# Patient Record
Sex: Male | Born: 1965 | Race: White | Hispanic: No | Smoking: Never smoker
Health system: Southern US, Community
[De-identification: ages and names within clinical notes are randomized; demographics above are authoritative.]

## PROBLEM LIST (undated history)

## (undated) DIAGNOSIS — G4733 Obstructive sleep apnea (adult) (pediatric): Secondary | ICD-10-CM

## (undated) DIAGNOSIS — K5732 Diverticulitis of large intestine without perforation or abscess without bleeding: Secondary | ICD-10-CM

## (undated) DIAGNOSIS — F40240 Claustrophobia: Secondary | ICD-10-CM

## (undated) DIAGNOSIS — I6529 Occlusion and stenosis of unspecified carotid artery: Secondary | ICD-10-CM

## (undated) DIAGNOSIS — I313 Pericardial effusion (noninflammatory): Secondary | ICD-10-CM

## (undated) DIAGNOSIS — N200 Calculus of kidney: Secondary | ICD-10-CM

## (undated) DIAGNOSIS — I350 Nonrheumatic aortic (valve) stenosis: Principal | ICD-10-CM

## (undated) DIAGNOSIS — I712 Thoracic aortic aneurysm, without rupture: Secondary | ICD-10-CM

## (undated) DIAGNOSIS — I3139 Other pericardial effusion (noninflammatory): Secondary | ICD-10-CM

## (undated) DIAGNOSIS — M10262 Drug-induced gout, left knee: Secondary | ICD-10-CM

## (undated) DIAGNOSIS — K5790 Diverticulosis of intestine, part unspecified, without perforation or abscess without bleeding: Secondary | ICD-10-CM

## (undated) DIAGNOSIS — K219 Gastro-esophageal reflux disease without esophagitis: Secondary | ICD-10-CM

## (undated) DIAGNOSIS — Z9989 Dependence on other enabling machines and devices: Secondary | ICD-10-CM

## (undated) DIAGNOSIS — I428 Other cardiomyopathies: Secondary | ICD-10-CM

## (undated) HISTORY — PX: TONSILLECTOMY: SUR1361

## (undated) HISTORY — DX: Other cardiomyopathies: I42.8

## (undated) HISTORY — PX: HERNIA REPAIR: SHX51

## (undated) HISTORY — PX: CORONARY ARTERY BYPASS GRAFT: SHX141

## (undated) HISTORY — PX: COLON SURGERY: SHX602

## (undated) HISTORY — DX: Diverticulosis of intestine, part unspecified, without perforation or abscess without bleeding: K57.90

## (undated) HISTORY — PX: APPENDECTOMY: SHX54

## (undated) HISTORY — DX: Pericardial effusion (noninflammatory): I31.3

## (undated) HISTORY — DX: Other pericardial effusion (noninflammatory): I31.39

## (undated) HISTORY — DX: Occlusion and stenosis of unspecified carotid artery: I65.29

## (undated) HISTORY — PX: LITHOTRIPSY: SUR834

## (undated) HISTORY — DX: Obstructive sleep apnea (adult) (pediatric): G47.33

## (undated) HISTORY — DX: Dependence on other enabling machines and devices: Z99.89

---

## 2000-11-14 ENCOUNTER — Emergency Department (HOSPITAL_COMMUNITY): Admission: EM | Admit: 2000-11-14 | Discharge: 2000-11-14 | Payer: Self-pay | Admitting: Emergency Medicine

## 2002-12-17 HISTORY — PX: UMBILICAL HERNIA REPAIR: SHX196

## 2003-12-18 ENCOUNTER — Ambulatory Visit (HOSPITAL_COMMUNITY): Admission: RE | Admit: 2003-12-18 | Discharge: 2003-12-18 | Payer: Self-pay | Admitting: General Surgery

## 2003-12-18 ENCOUNTER — Encounter (INDEPENDENT_AMBULATORY_CARE_PROVIDER_SITE_OTHER): Payer: Self-pay | Admitting: Specialist

## 2008-01-31 ENCOUNTER — Encounter: Admission: RE | Admit: 2008-01-31 | Discharge: 2008-01-31 | Payer: Self-pay | Admitting: Family Medicine

## 2008-02-07 ENCOUNTER — Ambulatory Visit (HOSPITAL_COMMUNITY): Admission: RE | Admit: 2008-02-07 | Discharge: 2008-02-07 | Payer: Self-pay | Admitting: Urology

## 2011-05-02 NOTE — Op Note (Signed)
NAME:  Alec Kelly, Alec Kelly                         ACCOUNT NO.:  [id number]   MEDICAL RECORD NO.:  [id number]                   PATIENT TYPE:  AMB   LOCATION:  DAY                                  FACILITY:  [facility]   PHYSICIAN:  Timothy E. Earlene Plater, M.D.              DATE OF BIRTH:  24-Nov-1966   DATE OF PROCEDURE:  12/17/2002  DATE OF DISCHARGE:                                 OPERATIVE REPORT   PREOPERATIVE DIAGNOSIS:  Umbilical hernia.   POSTOPERATIVE DIAGNOSIS:  Umbilical hernia.   OPERATION/PROCEDURE:  Repair of umbilical hernia with mesh.   SURGEON:  Timothy E. Earlene Plater, M.D.   ANESTHESIA:  General.   INDICATIONS:  [name] is 89, considers himself well although he is over  300 pounds and quite obese.  He has developed an enlarging, painful  umbilical hernia and wishes to have it repaired after careful discussion in  the office.  He was seen, identified, the permit signed, evaluated by  anesthesia.   DESCRIPTION OF PROCEDURE:  The patient was taken to the operating room,  placed supine. General endotracheal anesthesia administered.  The abdomen  had been shaved.  It was prepped and draped in the usual fashion.  A large  umbilical hernia was obvious on the left side of the umbilical area.  The  skin of the umbilicus was anesthetized with 0.25% Marcaine with epinephrine.  An infraumbilical incision was made and the hernia mass obvious.  The  subcutaneous tissue was dissected away from the hernia mass and the hernia  mass was dissected down to its fascial neck.  The neck had to be opened  laterally in order to open the hernia sac and evaluate the contents.  This  was done.  Contents were all omentum.  Through the enlarged defect, now the  omentum could be reduced without difficulty.  The peritoneal sac was closed  at its neck with a 2-0 Vicryl.  Excess sac cut away and delivered off the  field.  Then the edges of the hernia could be completely evaluated.  Then  dissection along the  anterior of the fascia was accomplished for about 3 cm  outside the hernia defect.  A small defect was found superior on the left  side of the abdominal wall, about 0.5 cm.  The preperitoneal fat was reduced  and that defect was closed with a buried suture of 0 Prolene.  The remainder  of the fascia was intact.  A piece of mesh was cut and fashioned to fit this  fascia.  It was sewn down to the anterior fascia with a running 2-0 Prolene.  This completed the repair.  The wound was dry.  Counts were correct.  Subcutaneous tissue was approximated with 2-0 Vicryl and skin closed with 3-  0 Monocryl.  Steri-Strips applied.  Final counts were correct.  He tolerated  it well.  He was awakened and taken to the recovery  room in good condition.                                               Timothy E. Earlene Plater, M.D.    TED/MEDQ  D:  12/18/2003  T:  12/18/2003  Job:  [id number]

## 2011-09-05 LAB — CBC
MCHC: 35.4
Platelets: 402 — ABNORMAL HIGH
RBC: 4.75
WBC: 7.5

## 2011-09-05 LAB — BASIC METABOLIC PANEL
CO2: 26
Calcium: 9.1
GFR calc Af Amer: 48 — ABNORMAL LOW
Sodium: 140

## 2011-09-05 LAB — URINALYSIS, ROUTINE W REFLEX MICROSCOPIC
Glucose, UA: NEGATIVE
Hgb urine dipstick: NEGATIVE
Nitrite: NEGATIVE
Urobilinogen, UA: 0.2
pH: 6.5

## 2012-07-03 ENCOUNTER — Encounter (HOSPITAL_BASED_OUTPATIENT_CLINIC_OR_DEPARTMENT_OTHER): Payer: Self-pay | Admitting: *Deleted

## 2012-07-03 ENCOUNTER — Emergency Department (HOSPITAL_BASED_OUTPATIENT_CLINIC_OR_DEPARTMENT_OTHER)
Admission: EM | Admit: 2012-07-03 | Discharge: 2012-07-03 | Disposition: A | Discharge status: Home / Self Care | Payer: Self-pay | Attending: Emergency Medicine | Admitting: Emergency Medicine

## 2012-07-03 ENCOUNTER — Emergency Department (HOSPITAL_BASED_OUTPATIENT_CLINIC_OR_DEPARTMENT_OTHER): Payer: Self-pay

## 2012-07-03 DIAGNOSIS — X500XXA Overexertion from strenuous movement or load, initial encounter: Secondary | ICD-10-CM | POA: Insufficient documentation

## 2012-07-03 DIAGNOSIS — S8390XA Sprain of unspecified site of unspecified knee, initial encounter: Secondary | ICD-10-CM

## 2012-07-03 DIAGNOSIS — S838X9A Sprain of other specified parts of unspecified knee, initial encounter: Secondary | ICD-10-CM | POA: Insufficient documentation

## 2012-07-03 MED ORDER — OXYCODONE-ACETAMINOPHEN 5-325 MG PO TABS
1.0000 | ORAL_TABLET | Freq: Once | ORAL | Status: AC
  Administered 2012-07-03: 1 via ORAL
  Filled 2012-07-03: qty 1

## 2012-07-03 MED ORDER — OXYCODONE-ACETAMINOPHEN 5-325 MG PO TABS: 1.0000 | ORAL_TABLET | Freq: Four times a day (QID) | ORAL | Status: AC | PRN

## 2012-07-03 MED ORDER — KETOROLAC TROMETHAMINE 60 MG/2ML IM SOLN
60.0000 mg | Freq: Once | INTRAMUSCULAR | Status: AC
  Administered 2012-07-03: 60 mg via INTRAMUSCULAR
  Filled 2012-07-03: qty 2

## 2012-07-03 NOTE — ED Provider Notes (Signed)
History     CSN: [id number]  Arrival date & time 07/03/12  1029   First MD Initiated Contact with Patient 07/03/12 1052      Chief Complaint  Patient presents with  . Knee Pain    (Consider location/radiation/quality/duration/timing/severity/associated sxs/prior treatment) Patient is a 46 y.o. male presenting with knee pain. The history is provided by the patient.  Knee Pain This is a new (stepped off his truck at work and stepped wrong and twisted his knee) problem. The current episode started yesterday. The problem occurs constantly. The problem has been gradually worsening. Associated symptoms comments: none. The symptoms are aggravated by walking, bending, twisting and standing. Nothing relieves the symptoms. Treatments tried: NSAIDs. The treatment provided mild relief.    History reviewed. No pertinent past medical history.  Past Surgical History  Procedure Date  . Tonsillectomy   . Appendectomy   . Hernia repair     No family history on file.  History  Substance Use Topics  . Smoking status: Never Smoker   . Smokeless tobacco: Not on file  . Alcohol Use: Yes      Review of Systems  All other systems reviewed and are negative.    Allergies  Review of patient's allergies indicates no known allergies.  Home Medications  No current outpatient prescriptions on file.  BP 114/80  Pulse 72  Temp 99.7 F (37.6 C) (Oral)  Resp 19  SpO2 98%  Physical Exam  Nursing note and vitals reviewed. Constitutional: He is oriented to person, place, and time. He appears well-developed and well-nourished. No distress.  HENT:  Head: Normocephalic and atraumatic.  Eyes: EOM are normal. Pupils are equal, round, and reactive to light.  Musculoskeletal:       Right knee: He exhibits decreased range of motion, swelling, effusion and bony tenderness. He exhibits no erythema, normal alignment, no LCL laxity and no MCL laxity. tenderness found. Lateral joint line and patellar  tendon tenderness noted.       Pt can almost completely extend and flex the leg at the knee without any concern for tendon damage  Neurological: He is alert and oriented to person, place, and time. He has normal strength. No sensory deficit.  Skin: Skin is warm and dry. No rash noted. No erythema.    ED Course  Procedures (including critical care time)  Labs Reviewed - No data to display Dg Knee Complete 4 Views Right  07/03/2012  *RADIOLOGY REPORT*  Clinical Data: Knee pain.  RIGHT KNEE - COMPLETE 4+ VIEW  Comparison: No priors.  Findings: Four views of the right knee demonstrate no acute fracture, subluxation or dislocation.  There is a large suprapatellar joint effusion.  IMPRESSION: 1.  Large suprapatellar joint effusion, without evidence of underlying bony.  Original Report Authenticated By: Florencia Reasons, M.D.     1. Knee sprain       MDM   Patient with a knee injury that occurred yesterday. His very swollen suprapatellar area on his right knee with significant tenderness over the lateral portion of the patella. There is no sign of dislocation and there are no ligamental laxities. Patient is having significant pain and was having difficulty ambulating. Patient given pain control and plain films pending to   11:54 AM Films show a large suprapatellar joint effusion without evidence of bony injury. Concern for possible ligamentous injury. Patient placed in a knee immobilizer and given followup in one to 2 weeks for recheck and possible MRI  Gwyneth Sprout, MD 07/03/12 1154

## 2012-07-03 NOTE — ED Notes (Signed)
Patient states he twisted R knee yesterday while working and hurts to touch/ambulate, swollen, took ibuprofen yesterday but no relief

## 2012-09-25 ENCOUNTER — Encounter (HOSPITAL_BASED_OUTPATIENT_CLINIC_OR_DEPARTMENT_OTHER): Payer: Self-pay | Admitting: Emergency Medicine

## 2012-09-25 ENCOUNTER — Emergency Department (HOSPITAL_BASED_OUTPATIENT_CLINIC_OR_DEPARTMENT_OTHER)
Admission: EM | Admit: 2012-09-25 | Discharge: 2012-09-25 | Disposition: A | Discharge status: Home / Self Care | Payer: Self-pay | Attending: Emergency Medicine | Admitting: Emergency Medicine

## 2012-09-25 ENCOUNTER — Emergency Department (HOSPITAL_BASED_OUTPATIENT_CLINIC_OR_DEPARTMENT_OTHER): Payer: Self-pay

## 2012-09-25 DIAGNOSIS — K5792 Diverticulitis of intestine, part unspecified, without perforation or abscess without bleeding: Secondary | ICD-10-CM

## 2012-09-25 DIAGNOSIS — R109 Unspecified abdominal pain: Secondary | ICD-10-CM | POA: Insufficient documentation

## 2012-09-25 DIAGNOSIS — K5732 Diverticulitis of large intestine without perforation or abscess without bleeding: Secondary | ICD-10-CM | POA: Insufficient documentation

## 2012-09-25 HISTORY — DX: Calculus of kidney: N20.0

## 2012-09-25 LAB — CBC WITH DIFFERENTIAL/PLATELET
Basophils Absolute: 0 10*3/uL (ref 0.0–0.1)
Basophils Relative: 0 % (ref 0–1)
HCT: 44.2 % (ref 39.0–52.0)
Lymphocytes Relative: 17 % (ref 12–46)
MCHC: 35.1 g/dL (ref 30.0–36.0)
Neutro Abs: 7 10*3/uL (ref 1.7–7.7)
Neutrophils Relative %: 71 % (ref 43–77)
Platelets: 292 10*3/uL (ref 150–400)
RDW: 12.9 % (ref 11.5–15.5)
WBC: 9.9 10*3/uL (ref 4.0–10.5)

## 2012-09-25 LAB — COMPREHENSIVE METABOLIC PANEL
ALT: 27 U/L (ref 0–53)
AST: 25 U/L (ref 0–37)
Albumin: 3.8 g/dL (ref 3.5–5.2)
CO2: 28 mEq/L (ref 19–32)
Chloride: 101 mEq/L (ref 96–112)
GFR calc non Af Amer: 64 mL/min — ABNORMAL LOW (ref >90)
Potassium: 4.1 mEq/L (ref 3.5–5.1)
Sodium: 139 mEq/L (ref 135–145)
Total Bilirubin: 0.5 mg/dL (ref 0.3–1.2)

## 2012-09-25 LAB — URINALYSIS, ROUTINE W REFLEX MICROSCOPIC
Hgb urine dipstick: NEGATIVE
Leukocytes, UA: NEGATIVE
Nitrite: NEGATIVE
Specific Gravity, Urine: 1.023 (ref 1.005–1.030)
Urobilinogen, UA: 1 mg/dL (ref 0.0–1.0)

## 2012-09-25 LAB — LIPASE, BLOOD: Lipase: 43 U/L (ref 11–59)

## 2012-09-25 MED ORDER — KETOROLAC TROMETHAMINE 30 MG/ML IJ SOLN
30.0000 mg | Freq: Once | INTRAMUSCULAR | Status: AC
  Administered 2012-09-25: 30 mg via INTRAVENOUS
  Filled 2012-09-25: qty 1

## 2012-09-25 MED ORDER — IOHEXOL 300 MG/ML  SOLN
100.0000 mL | Freq: Once | INTRAMUSCULAR | Status: AC | PRN
  Administered 2012-09-25: 100 mL via INTRAVENOUS

## 2012-09-25 MED ORDER — ONDANSETRON HCL 4 MG/2ML IJ SOLN
4.0000 mg | Freq: Once | INTRAMUSCULAR | Status: AC
  Administered 2012-09-25: 4 mg via INTRAVENOUS
  Filled 2012-09-25: qty 2

## 2012-09-25 MED ORDER — IOHEXOL 300 MG/ML  SOLN
25.0000 mL | INTRAMUSCULAR | Status: AC
  Administered 2012-09-25 (×2): 25 mL via ORAL

## 2012-09-25 MED ORDER — CIPROFLOXACIN HCL 500 MG PO TABS: 500.0000 mg | ORAL_TABLET | Freq: Two times a day (BID) | ORAL | Status: DC

## 2012-09-25 MED ORDER — METRONIDAZOLE 500 MG PO TABS: 500.0000 mg | ORAL_TABLET | Freq: Two times a day (BID) | ORAL | Status: DC

## 2012-09-25 MED ORDER — AMPICILLIN-SULBACTAM SODIUM 3 (2-1) G IJ SOLR
3.0000 g | Freq: Once | INTRAMUSCULAR | Status: AC
  Administered 2012-09-25: 3 g via INTRAVENOUS
  Filled 2012-09-25: qty 3

## 2012-09-25 MED ORDER — OXYCODONE-ACETAMINOPHEN 7.5-325 MG PO TABS: 1.0000 | ORAL_TABLET | ORAL | Status: DC | PRN

## 2012-09-25 MED ORDER — SODIUM CHLORIDE 0.9 % IV SOLN: INTRAVENOUS | Status: DC

## 2012-09-25 NOTE — ED Notes (Signed)
Pt states he started to have groin pain bilaterally radiating to back x 3 days.  No dysuria or diff with elimination.  No N/V/D.  No fever.  Pt was riding in a truck when started.  Denies any injury.

## 2012-09-25 NOTE — ED Notes (Signed)
rx x 3 given for cipro flagyl and percocet

## 2012-09-25 NOTE — ED Provider Notes (Signed)
History     CSN: [id number]  Arrival date & time 09/25/12  1105   First MD Initiated Contact with Patient 09/25/12 1212      Chief Complaint  Patient presents with  . Pelvic Pain  . Back Pain     HPI Pt states he started to have groin pain bilaterally radiating to back x 3 days. No dysuria or diff with elimination. No N/V/D. No fever. Pt was riding in a truck when started. Denies any injury.  Nothing makes pain worse or better.  Patient denies diarrhea.  Denies any history prostate problems.  Past Medical History  Diagnosis Date  . Kidney stones     Past Surgical History  Procedure Date  . Tonsillectomy   . Appendectomy   . Hernia repair   . Lithotripsy     No family history on file.  History  Substance Use Topics  . Smoking status: Never Smoker   . Smokeless tobacco: Not on file  . Alcohol Use: Yes      Review of Systems All other review systems are negative Allergies  Review of patient's allergies indicates no known allergies.  Home Medications   Current Outpatient Rx  Name Route Sig Dispense Refill  . CIPROFLOXACIN HCL 500 MG PO TABS Oral Take 1 tablet (500 mg total) by mouth every 12 (twelve) hours. 20 tablet 0  . METRONIDAZOLE 500 MG PO TABS Oral Take 1 tablet (500 mg total) by mouth 2 (two) times daily. 20 tablet 0  . OXYCODONE-ACETAMINOPHEN 7.5-325 MG PO TABS Oral Take 1 tablet by mouth every 4 (four) hours as needed for pain. 30 tablet 0    BP 113/80  Pulse 79  Temp 98.9 F (37.2 C) (Oral)  Resp 20  Ht 5\' 8"  (1.727 m)  Wt 260 lb (117.935 kg)  BMI 39.53 kg/m2  SpO2 99%  Physical Exam  Nursing note and vitals reviewed. Constitutional: He is oriented to person, place, and time. He appears well-developed. No distress.  HENT:  Head: Normocephalic and atraumatic.  Eyes: Pupils are equal, round, and reactive to light.  Neck: Normal range of motion.  Cardiovascular: Normal rate and intact distal pulses.   Pulmonary/Chest: No respiratory  distress.  Abdominal: Normal appearance. He exhibits no distension. There is tenderness (Diffuse lower abdominal). There is no rebound and no guarding.  Musculoskeletal: Normal range of motion.  Neurological: He is alert and oriented to person, place, and time. No cranial nerve deficit.  Skin: Skin is warm and dry. No rash noted.  Psychiatric: He has a normal mood and affect. His behavior is normal.    ED Course  Procedures (including critical care time)  Medications  0.9 %  sodium chloride infusion (not administered)  Ampicillin-Sulbactam (UNASYN) 3 g in sodium chloride 0.9 % 100 mL IVPB (not administered)  ciprofloxacin (CIPRO) 500 MG tablet (not administered)  metroNIDAZOLE (FLAGYL) 500 MG tablet (not administered)  oxyCODONE-acetaminophen (PERCOCET) 7.5-325 MG per tablet (not administered)  ketorolac (TORADOL) 30 MG/ML injection 30 mg (30 mg Intravenous Given 09/25/12 1309)  ondansetron (ZOFRAN) injection 4 mg (4 mg Intravenous Given 09/25/12 1308)  iohexol (OMNIPAQUE) 300 MG/ML solution 25 mL (25 mL Oral Contrast Given 09/25/12 1247)  iohexol (OMNIPAQUE) 300 MG/ML solution 100 mL (100 mL Intravenous Contrast Given 09/25/12 1344)    Labs Reviewed  URINALYSIS, ROUTINE W REFLEX MICROSCOPIC - Abnormal; Notable for the following:    Bilirubin Urine SMALL (*)     All other components within normal limits  COMPREHENSIVE  METABOLIC PANEL - Abnormal; Notable for the following:    Glucose, Bld 100 (*)     GFR calc non Af Amer 64 (*)     GFR calc Af Amer 75 (*)     All other components within normal limits  LIPASE, BLOOD  CBC WITH DIFFERENTIAL   Ct Abdomen Pelvis W Contrast  09/25/2012  *RADIOLOGY REPORT*  Clinical Data: Abdominal pain radiating to back.  Leukocytosis. Previous appendectomy and hernia repair.  CT ABDOMEN AND PELVIS WITH CONTRAST  Technique:  Multidetector CT imaging of the abdomen and pelvis was performed following the standard protocol during bolus administration of  intravenous contrast.  Contrast: OMNIPAQUE IOHEXOL 300 MG/ML  SOLN  Comparison: None.  Findings: A tiny nonobstructing 2 mm calculus is seen in the upper pole of the left kidney.  There is no evidence of renal mass or hydronephrosis.  The other abdominal parenchymal organs are normal in appearance.  Gallbladder is unremarkable.  Moderate diverticulitis is seen involving the proximal sigmoid colon.  There is moderate pericolonic inflammatory changes within the sigmoid mesentery, as well as shotty mesenteric lymphadenopathy which is likely reactive.  No evidence of abscess or free fluid. No evidence of bowel obstruction.  IMPRESSION:  1.  Moderate diverticulitis of the proximal sigmoid colon. 2.  No evidence of abscess or other complication. 3.  Nonobstructing left nephrolithiasis incidentally noted.   Original Report Authenticated By: Danae Orleans, M.D.      1. Diverticulitis       MDM          Nelia Shi, MD 09/25/12 2234

## 2013-03-27 ENCOUNTER — Encounter (HOSPITAL_BASED_OUTPATIENT_CLINIC_OR_DEPARTMENT_OTHER): Payer: Self-pay | Admitting: *Deleted

## 2013-03-27 ENCOUNTER — Emergency Department (HOSPITAL_BASED_OUTPATIENT_CLINIC_OR_DEPARTMENT_OTHER): Payer: Self-pay

## 2013-03-27 ENCOUNTER — Emergency Department (HOSPITAL_BASED_OUTPATIENT_CLINIC_OR_DEPARTMENT_OTHER)
Admission: EM | Admit: 2013-03-27 | Discharge: 2013-03-27 | Disposition: A | Discharge status: Home / Self Care | Payer: Self-pay | Attending: Emergency Medicine | Admitting: Emergency Medicine

## 2013-03-27 DIAGNOSIS — R509 Fever, unspecified: Secondary | ICD-10-CM | POA: Insufficient documentation

## 2013-03-27 DIAGNOSIS — K5792 Diverticulitis of intestine, part unspecified, without perforation or abscess without bleeding: Secondary | ICD-10-CM

## 2013-03-27 DIAGNOSIS — Z87442 Personal history of urinary calculi: Secondary | ICD-10-CM | POA: Insufficient documentation

## 2013-03-27 DIAGNOSIS — R197 Diarrhea, unspecified: Secondary | ICD-10-CM | POA: Insufficient documentation

## 2013-03-27 DIAGNOSIS — K5732 Diverticulitis of large intestine without perforation or abscess without bleeding: Secondary | ICD-10-CM | POA: Insufficient documentation

## 2013-03-27 LAB — CBC WITH DIFFERENTIAL/PLATELET
Eosinophils Absolute: 0.1 10*3/uL (ref 0.0–0.7)
Eosinophils Relative: 1 % (ref 0–5)
HCT: 42.3 % (ref 39.0–52.0)
Lymphocytes Relative: 7 % — ABNORMAL LOW (ref 12–46)
Lymphs Abs: 0.9 10*3/uL (ref 0.7–4.0)
MCH: 31.3 pg (ref 26.0–34.0)
MCV: 87.2 fL (ref 78.0–100.0)
Monocytes Absolute: 1.2 10*3/uL — ABNORMAL HIGH (ref 0.1–1.0)
Platelets: 202 10*3/uL (ref 150–400)
RBC: 4.85 MIL/uL (ref 4.22–5.81)
WBC: 13.1 10*3/uL — ABNORMAL HIGH (ref 4.0–10.5)

## 2013-03-27 LAB — COMPREHENSIVE METABOLIC PANEL
BUN: 17 mg/dL (ref 6–23)
CO2: 24 mEq/L (ref 19–32)
Calcium: 9.1 mg/dL (ref 8.4–10.5)
Creatinine, Ser: 1.2 mg/dL (ref 0.50–1.35)
GFR calc Af Amer: 82 mL/min — ABNORMAL LOW (ref >90)
GFR calc non Af Amer: 71 mL/min — ABNORMAL LOW (ref >90)
Glucose, Bld: 117 mg/dL — ABNORMAL HIGH (ref 70–99)
Sodium: 138 mEq/L (ref 135–145)
Total Protein: 7.5 g/dL (ref 6.0–8.3)

## 2013-03-27 LAB — URINALYSIS, ROUTINE W REFLEX MICROSCOPIC
Bilirubin Urine: NEGATIVE
Glucose, UA: NEGATIVE mg/dL
Hgb urine dipstick: NEGATIVE
Protein, ur: NEGATIVE mg/dL
Urobilinogen, UA: 1 mg/dL (ref 0.0–1.0)

## 2013-03-27 MED ORDER — ONDANSETRON HCL 4 MG/2ML IJ SOLN
4.0000 mg | Freq: Once | INTRAMUSCULAR | Status: AC
  Administered 2013-03-27: 4 mg via INTRAVENOUS
  Filled 2013-03-27: qty 2

## 2013-03-27 MED ORDER — IOHEXOL 300 MG/ML  SOLN
100.0000 mL | Freq: Once | INTRAMUSCULAR | Status: AC | PRN
  Administered 2013-03-27: 100 mL via INTRAVENOUS

## 2013-03-27 MED ORDER — HYDROMORPHONE HCL PF 1 MG/ML IJ SOLN
1.0000 mg | Freq: Once | INTRAMUSCULAR | Status: AC
  Administered 2013-03-27: 1 mg via INTRAVENOUS
  Filled 2013-03-27: qty 1

## 2013-03-27 MED ORDER — METRONIDAZOLE 500 MG PO TABS: 500.0000 mg | ORAL_TABLET | Freq: Two times a day (BID) | ORAL | Status: DC

## 2013-03-27 MED ORDER — CIPROFLOXACIN HCL 500 MG PO TABS: 500.0000 mg | ORAL_TABLET | Freq: Two times a day (BID) | ORAL | Status: DC

## 2013-03-27 MED ORDER — OXYCODONE-ACETAMINOPHEN 5-325 MG PO TABS: 2.0000 | ORAL_TABLET | ORAL | Status: DC | PRN

## 2013-03-27 MED ORDER — ONDANSETRON 4 MG PO TBDP: 4.0000 mg | ORAL_TABLET | Freq: Three times a day (TID) | ORAL | Status: DC | PRN

## 2013-03-27 MED ORDER — IOHEXOL 300 MG/ML  SOLN
50.0000 mL | Freq: Once | INTRAMUSCULAR | Status: AC | PRN
  Administered 2013-03-27: 50 mL via ORAL

## 2013-03-27 NOTE — ED Provider Notes (Signed)
History     CSN: [id number]  Arrival date & time 03/27/13  1627   First MD Initiated Contact with Patient 03/27/13 1902      Chief Complaint  Patient presents with  . Abdominal Pain    (Consider location/radiation/quality/duration/timing/severity/associated sxs/prior treatment) Patient is a 47 y.o. male presenting with abdominal pain. The history is provided by the patient. No language interpreter was used.  Abdominal Pain Pain location:  LLQ Pain quality: sharp and shooting   Pain radiates to:  LLQ Pain severity:  Moderate Onset quality:  Gradual Timing:  Constant Progression:  Worsening Chronicity:  New Relieved by:  Nothing Worsened by:  Nothing tried Ineffective treatments:  None tried Associated symptoms: diarrhea and fever     Past Medical History  Diagnosis Date  . Kidney stones     Past Surgical History  Procedure Laterality Date  . Tonsillectomy    . Appendectomy    . Hernia repair    . Lithotripsy      History reviewed. No pertinent family history.  History  Substance Use Topics  . Smoking status: Never Smoker   . Smokeless tobacco: Not on file  . Alcohol Use: Yes      Review of Systems  Constitutional: Positive for fever.  Gastrointestinal: Positive for abdominal pain and diarrhea.  All other systems reviewed and are negative.    Allergies  Review of patient's allergies indicates no known allergies.  Home Medications   Current Outpatient Rx  Name  Route  Sig  Dispense  Refill  . ciprofloxacin (CIPRO) 500 MG tablet   Oral   Take 1 tablet (500 mg total) by mouth every 12 (twelve) hours.   20 tablet   0   . metroNIDAZOLE (FLAGYL) 500 MG tablet   Oral   Take 1 tablet (500 mg total) by mouth 2 (two) times daily.   20 tablet   0   . oxyCODONE-acetaminophen (PERCOCET) 7.5-325 MG per tablet   Oral   Take 1 tablet by mouth every 4 (four) hours as needed for pain.   30 tablet   0     BP 116/80  Pulse 100  Temp(Src) 100.5 F  (38.1 C) (Oral)  Resp 22  Ht 5\' 8"  (1.727 m)  Wt 265 lb (120.203 kg)  BMI 40.3 kg/m2  SpO2 94%  Physical Exam  Nursing note and vitals reviewed. Constitutional: He appears well-developed and well-nourished.  HENT:  Head: Normocephalic.  Eyes: Pupils are equal, round, and reactive to light.  Neck: Normal range of motion.  Cardiovascular: Normal rate and regular rhythm.   Pulmonary/Chest: Effort normal and breath sounds normal.  Abdominal: Soft. There is tenderness. There is guarding.  Musculoskeletal: Normal range of motion.  Neurological: He is alert.  Skin: Skin is warm.  Psychiatric: He has a normal mood and affect.    ED Course  Procedures (including critical care time)  Labs Reviewed  URINALYSIS, ROUTINE W REFLEX MICROSCOPIC - Abnormal; Notable for the following:    pH 8.5 (*)    Ketones, ur 15 (*)    All other components within normal limits  CBC WITH DIFFERENTIAL - Abnormal; Notable for the following:    WBC 13.1 (*)    Neutrophils Relative 83 (*)    Neutro Abs 10.9 (*)    Lymphocytes Relative 7 (*)    Monocytes Absolute 1.2 (*)    All other components within normal limits  COMPREHENSIVE METABOLIC PANEL - Abnormal; Notable for the following:  Glucose, Bld 117 (*)    GFR calc non Af Amer 71 (*)    GFR calc Af Amer 82 (*)    All other components within normal limits   No results found.   1. Diverticulitis       MDM  Pt has tender left lower quadrant.   Pt has had diverticulitis in the past but pain was not this bad.  Pt reports pain began this am.   Pt given Iv fluids,  Pain medications and zofran,   Ct scan shows diverticulitis in the same area as previous diverticulitis.    Pt given rx for flagyl, cipro,percocet, zofran odt        Elson Areas, PA-C 03/27/13 2144  Lonia Skinner Dixon, [place] 03/27/13 2243

## 2013-03-27 NOTE — ED Notes (Signed)
Pt states he had some blood in his stool last Th or Fri. Began having low abd pain yesterday. Denies urinary s/s. States having chills

## 2013-03-27 NOTE — ED Notes (Signed)
Patient transported to CT 

## 2013-03-27 NOTE — ED Provider Notes (Signed)
Medical screening examination/treatment/procedure(s) were performed by non-physician practitioner and as supervising physician I was immediately available for consultation/collaboration.  Ethelda Chick, MD 03/27/13 [phone]

## 2013-03-30 ENCOUNTER — Inpatient Hospital Stay (HOSPITAL_COMMUNITY)
Admission: EM | Admit: 2013-03-30 | Discharge: 2013-04-08 | DRG: 392 | Disposition: A | Discharge status: Home / Self Care | Payer: MEDICAID | Attending: Surgery | Admitting: Surgery

## 2013-03-30 ENCOUNTER — Encounter (HOSPITAL_COMMUNITY): Payer: Self-pay | Admitting: Emergency Medicine

## 2013-03-30 ENCOUNTER — Emergency Department (HOSPITAL_COMMUNITY): Payer: Self-pay

## 2013-03-30 DIAGNOSIS — K2289 Other specified disease of esophagus: Secondary | ICD-10-CM | POA: Clinically undetermined

## 2013-03-30 DIAGNOSIS — M25469 Effusion, unspecified knee: Secondary | ICD-10-CM | POA: Diagnosis not present

## 2013-03-30 DIAGNOSIS — D72829 Elevated white blood cell count, unspecified: Secondary | ICD-10-CM | POA: Diagnosis present

## 2013-03-30 DIAGNOSIS — M109 Gout, unspecified: Secondary | ICD-10-CM | POA: Diagnosis not present

## 2013-03-30 DIAGNOSIS — K5732 Diverticulitis of large intestine without perforation or abscess without bleeding: Principal | ICD-10-CM | POA: Diagnosis present

## 2013-03-30 DIAGNOSIS — M10069 Idiopathic gout, unspecified knee: Secondary | ICD-10-CM | POA: Diagnosis not present

## 2013-03-30 DIAGNOSIS — K5720 Diverticulitis of large intestine with perforation and abscess without bleeding: Secondary | ICD-10-CM | POA: Diagnosis present

## 2013-03-30 DIAGNOSIS — E66813 Obesity, class 3: Secondary | ICD-10-CM | POA: Diagnosis present

## 2013-03-30 DIAGNOSIS — Z6841 Body Mass Index (BMI) 40.0 and over, adult: Secondary | ICD-10-CM

## 2013-03-30 DIAGNOSIS — K228 Other specified diseases of esophagus: Secondary | ICD-10-CM | POA: Clinically undetermined

## 2013-03-30 DIAGNOSIS — K630 Abscess of intestine: Secondary | ICD-10-CM | POA: Diagnosis present

## 2013-03-30 HISTORY — DX: Drug-induced gout, left knee: M10.262

## 2013-03-30 HISTORY — DX: Diverticulitis of large intestine without perforation or abscess without bleeding: K57.32

## 2013-03-30 HISTORY — DX: Morbid (severe) obesity due to excess calories: E66.01

## 2013-03-30 LAB — COMPREHENSIVE METABOLIC PANEL
Albumin: 3.2 g/dL — ABNORMAL LOW (ref 3.5–5.2)
Alkaline Phosphatase: 73 U/L (ref 39–117)
BUN: 17 mg/dL (ref 6–23)
Calcium: 9 mg/dL (ref 8.4–10.5)
Creatinine, Ser: 0.97 mg/dL (ref 0.50–1.35)
GFR calc Af Amer: >90 mL/min (ref >90)
Glucose, Bld: 112 mg/dL — ABNORMAL HIGH (ref 70–99)
Potassium: 6.1 mEq/L — ABNORMAL HIGH (ref 3.5–5.1)
Total Protein: 8.2 g/dL (ref 6.0–8.3)

## 2013-03-30 LAB — CBC WITH DIFFERENTIAL/PLATELET
Basophils Relative: 0 % (ref 0–1)
Eosinophils Absolute: 0.1 10*3/uL (ref 0.0–0.7)
Eosinophils Relative: 1 % (ref 0–5)
HCT: 42.8 % (ref 39.0–52.0)
Hemoglobin: 15.3 g/dL (ref 13.0–17.0)
Lymphs Abs: 1 10*3/uL (ref 0.7–4.0)
MCH: 31.4 pg (ref 26.0–34.0)
MCHC: 35.7 g/dL (ref 30.0–36.0)
MCV: 87.9 fL (ref 78.0–100.0)
Monocytes Absolute: 1.2 10*3/uL — ABNORMAL HIGH (ref 0.1–1.0)
Monocytes Relative: 8 % (ref 3–12)
RBC: 4.87 MIL/uL (ref 4.22–5.81)

## 2013-03-30 LAB — POTASSIUM: Potassium: 3.7 mEq/L (ref 3.5–5.1)

## 2013-03-30 LAB — LIPASE, BLOOD: Lipase: 22 U/L (ref 11–59)

## 2013-03-30 MED ORDER — SODIUM CHLORIDE 0.9 % IV SOLN
1.0000 g | Freq: Every day | INTRAVENOUS | Status: DC
  Administered 2013-03-30 – 2013-04-06 (×8): 1 g via INTRAVENOUS
  Filled 2013-03-30 (×9): qty 1

## 2013-03-30 MED ORDER — CIPROFLOXACIN IN D5W 400 MG/200ML IV SOLN
400.0000 mg | Freq: Once | INTRAVENOUS | Status: AC
  Administered 2013-03-30: 400 mg via INTRAVENOUS
  Filled 2013-03-30: qty 200

## 2013-03-30 MED ORDER — IOHEXOL 300 MG/ML  SOLN
100.0000 mL | Freq: Once | INTRAMUSCULAR | Status: AC | PRN
  Administered 2013-03-30: 100 mL via INTRAVENOUS

## 2013-03-30 MED ORDER — SODIUM CHLORIDE 0.9 % IV SOLN
INTRAVENOUS | Status: DC
  Administered 2013-03-30 – 2013-04-05 (×10): via INTRAVENOUS
  Administered 2013-04-07: 500 mL via INTRAVENOUS

## 2013-03-30 MED ORDER — IOHEXOL 300 MG/ML  SOLN
50.0000 mL | Freq: Once | INTRAMUSCULAR | Status: AC | PRN
  Administered 2013-03-30: 50 mL via ORAL

## 2013-03-30 MED ORDER — ONDANSETRON HCL 4 MG/2ML IJ SOLN
4.0000 mg | Freq: Once | INTRAMUSCULAR | Status: AC
  Administered 2013-03-30: 4 mg via INTRAVENOUS
  Filled 2013-03-30: qty 2

## 2013-03-30 MED ORDER — SODIUM CHLORIDE 0.9 % IV SOLN
Freq: Once | INTRAVENOUS | Status: AC
  Administered 2013-03-30: 20:00:00 via INTRAVENOUS

## 2013-03-30 MED ORDER — ENOXAPARIN SODIUM 40 MG/0.4ML ~~LOC~~ SOLN
40.0000 mg | Freq: Every day | SUBCUTANEOUS | Status: DC
  Administered 2013-03-30 – 2013-04-07 (×9): 40 mg via SUBCUTANEOUS
  Filled 2013-03-30 (×10): qty 0.4

## 2013-03-30 MED ORDER — MORPHINE SULFATE 4 MG/ML IJ SOLN
4.0000 mg | INTRAMUSCULAR | Status: DC | PRN
  Administered 2013-03-30 – 2013-04-03 (×18): 4 mg via INTRAVENOUS
  Filled 2013-03-30 (×19): qty 1

## 2013-03-30 MED ORDER — FENTANYL CITRATE 0.05 MG/ML IJ SOLN
100.0000 ug | Freq: Once | INTRAMUSCULAR | Status: AC
  Administered 2013-03-30: 100 ug via INTRAVENOUS
  Filled 2013-03-30: qty 2

## 2013-03-30 MED ORDER — SODIUM CHLORIDE 0.9 % IV BOLUS (SEPSIS)
1000.0000 mL | Freq: Once | INTRAVENOUS | Status: AC
  Administered 2013-03-30: 1000 mL via INTRAVENOUS

## 2013-03-30 MED ORDER — METRONIDAZOLE IN NACL 5-0.79 MG/ML-% IV SOLN
500.0000 mg | Freq: Once | INTRAVENOUS | Status: AC
  Administered 2013-03-30: 500 mg via INTRAVENOUS
  Filled 2013-03-30: qty 100

## 2013-03-30 MED ORDER — ONDANSETRON HCL 4 MG/2ML IJ SOLN: 4.0000 mg | Freq: Four times a day (QID) | INTRAMUSCULAR | Status: DC | PRN

## 2013-03-30 MED ORDER — OXYCODONE HCL 5 MG PO TABS
5.0000 mg | ORAL_TABLET | ORAL | Status: DC | PRN
  Administered 2013-04-02 – 2013-04-07 (×12): 5 mg via ORAL
  Filled 2013-03-30 (×11): qty 1
  Filled 2013-03-30: qty 2
  Filled 2013-03-30: qty 1

## 2013-03-30 NOTE — ED Notes (Signed)
Pt c/o of left sided abd pain associated with nausea and vomiting, denies dirrhea. Unable to keep anything down, 2-3 episodes of vomiting today. Hx. of diverticulitis.

## 2013-03-30 NOTE — ED Provider Notes (Signed)
History     CSN: [id number]  Arrival date & time 03/30/13  1903   First MD Initiated Contact with Patient 03/30/13 1909      Chief Complaint  Patient presents with  . Abdominal Pain  . Nausea  . Emesis    (Consider location/radiation/quality/duration/timing/severity/associated sxs/prior treatment) HPI 47 yo otherwise healthy male presenting with worsening abdominal pain, nausea and vomiting.  He was seen in the ED on 4/13 and diagnosed with diverticulitis by CT scan.  He was discharged on Cipro and Flagyl.  He reports that he has been compliant with all medications and instructions, but has continued nausea, vomiting and fevers at home.  He states that the antibiotics mostly stay down.  The abdominal pain is now more diffuse, and his wife states that she is unable to sleep in the bed with him as it causes him pain when she moves.  Also has pain in the RUQ.  Had a non-bloody bowel movement his morning.    Past Medical History  Diagnosis Date  . Kidney stones   . Diverticulitis     Past Surgical History  Procedure Laterality Date  . Tonsillectomy    . Appendectomy    . Hernia repair    . Lithotripsy      No family history on file.  History  Substance Use Topics  . Smoking status: Never Smoker   . Smokeless tobacco: Not on file  . Alcohol Use: Yes      Review of Systems  Constitutional: Positive for fever and chills.  HENT: Negative.   Eyes: Negative.   Respiratory: Negative.   Cardiovascular: Negative.   Gastrointestinal: Positive for nausea, vomiting and abdominal pain. Negative for diarrhea, constipation and blood in stool.  Genitourinary: Negative.   Skin: Negative.   Neurological: Negative.     Allergies  Review of patient's allergies indicates no known allergies.  Home Medications   Current Outpatient Rx  Name  Route  Sig  Dispense  Refill  . ciprofloxacin (CIPRO) 500 MG tablet   Oral   Take 1 tablet (500 mg total) by mouth every 12 (twelve)  hours.   20 tablet   0   . metroNIDAZOLE (FLAGYL) 500 MG tablet   Oral   Take 1 tablet (500 mg total) by mouth 2 (two) times daily.   20 tablet   0   . ondansetron (ZOFRAN ODT) 4 MG disintegrating tablet   Oral   Take 1 tablet (4 mg total) by mouth every 8 (eight) hours as needed for nausea.   10 tablet   0   . oxyCODONE-acetaminophen (PERCOCET/ROXICET) 5-325 MG per tablet   Oral   Take 2 tablets by mouth every 4 (four) hours as needed for pain.   20 tablet   0     There were no vitals taken for this visit.  Physical Exam  Constitutional: He is oriented to person, place, and time. He appears well-developed and well-nourished. He appears distressed (especially with movement of bed).  HENT:  Head: Normocephalic and atraumatic.  Mouth/Throat: Oropharynx is clear and moist.  Eyes: Conjunctivae are normal. Right eye exhibits no discharge. Left eye exhibits no discharge.  Neck: Normal range of motion. Neck supple.  Cardiovascular: Normal rate, regular rhythm, normal heart sounds and intact distal pulses.   No murmur heard. Pulmonary/Chest: Effort normal and breath sounds normal. No respiratory distress. He has no wheezes. He has no rales.  Abdominal: Soft. He exhibits no distension. Bowel sounds are increased. There  is generalized tenderness. There is rebound and guarding.  Neurological: He is alert and oriented to person, place, and time. He exhibits normal muscle tone.  Skin: Skin is warm and dry. No rash noted. He is not diaphoretic. No erythema.  Psychiatric: He has a normal mood and affect. Thought content normal.    ED Course  Procedures (including critical care time)  Labs Reviewed  CBC WITH DIFFERENTIAL - Abnormal; Notable for the following:    WBC 15.5 (*)    Neutrophils Relative 85 (*)    Neutro Abs 13.2 (*)    Lymphocytes Relative 6 (*)    Monocytes Absolute 1.2 (*)    All other components within normal limits  COMPREHENSIVE METABOLIC PANEL - Abnormal;  Notable for the following:    Sodium 134 (*)    Potassium 6.1 (*)    Glucose, Bld 112 (*)    Albumin 3.2 (*)    AST 53 (*)    All other components within normal limits  LIPASE, BLOOD  POTASSIUM  BASIC METABOLIC PANEL  CBC   Ct Abdomen Pelvis W Contrast  03/30/2013  *RADIOLOGY REPORT*  Clinical Data: Left lower quadrant abdominal pain, history of diverticulitis.  Prior appendectomy.  CT ABDOMEN AND PELVIS WITH CONTRAST  Technique:  Multidetector CT imaging of the abdomen and pelvis was performed following the standard protocol during bolus administration of intravenous contrast.  Contrast: 50mL OMNIPAQUE IOHEXOL 300 MG/ML  SOLN, OMNIPAQUE IOHEXOL 300 MG/ML  SOLN  Comparison: 03/27/2013  Findings: Dependent bibasilar atelectasis noted.  There is new intra-abdominal free air as well as gas tracking within the mesentery and within a pericolonic rim enhancing fluid collection measuring 4.0 x 2.9 cm image 71, adjacent to the previously seen thickened segment of sigmoid colon.  There are other rim enhancing fluid collections within the pelvis, contiguous with pericolonic stranding, measuring 4.2 x 3.4 cm image 76 and 4.3 x 2.7 cm image 82, for example.  Subjectively, there is no significant change in the degree of sigmoid colonic wall thickening, however associated pericolonic stranding and other inflammatory change has increased.  High density material within the gallbladder is compatible with vicarious excretion of contrast or less likely sludge.  2 mm nonobstructing left upper renal pole calculus is stable.  A few left upper quadrant loops of jejunum measure 2.8 cm, upper limits of normal, but there is no evidence for obstruction to distal passage of contrast.  IMPRESSION: Interval sigmoid colonic perforation secondary to diverticulitis with multiple pericolonic/pelvic rim enhancing fluid collections compatible with abscess formation.  Critical Value/emergent results were called by telephone at the  time of interpretation on 03/30/2013 at 9:45 p.m. to Dr. Radford Pax, who verbally acknowledged these results.   Original Report Authenticated By: Christiana Pellant, M.D.      No diagnosis found.    MDM  47 yo M with diverticulitis diagnosed by CT on 03/27/13 presenting with worsening abdominal pain and continued n/v and fevers despite compliance with medications.  + Peritoneal signs on exam.  Hemodynamically stable with pulse in the 80s on exam and BP of 130s/80s.  Will check cbc, cmp, lipase.  1L NS bolus, zofran and fentanyl.  Will consult surgery for recommendations on further imaging.   10:00PM: CT showing free air and abscess formation.  K is elevated on labs, repeat ordered.  Surgery to admit patient.        Phebe Colla, MD 03/30/13 2202

## 2013-03-30 NOTE — H&P (Signed)
Alec Kelly is an 47 y.o. male.   Chief Complaint: abd pain HPI: [facility] with abd pain.  CT showed focal diverticulitis.  He was placed on cipro and flagyl and discharged.  He returns tonight with worsening pain, nausea and vomiting.    Past Medical History  Diagnosis Date  . Kidney stones   . Diverticulitis     Past Surgical History  Procedure Laterality Date  . Tonsillectomy    . Appendectomy    . Hernia repair    . Lithotripsy      History reviewed. No pertinent family history. Social History:  reports that he has never smoked. He does not have any smokeless tobacco history on file. He reports that  drinks alcohol. He reports that he does not use illicit drugs.  Allergies: No Known Allergies   (Not in a hospital admission)  Results for orders placed during the hospital encounter of 03/30/13 (from the past 48 hour(s))  CBC WITH DIFFERENTIAL     Status: Abnormal   Collection Time    03/30/13  7:06 PM      Result Value Range   WBC 15.5 (*) 4.0 - 10.5 K/uL   RBC 4.87  4.22 - 5.81 MIL/uL   Hemoglobin 15.3  13.0 - 17.0 g/dL   HCT 09.8  11.9 - 14.7 %   MCV 87.9  78.0 - 100.0 fL   MCH 31.4  26.0 - 34.0 pg   MCHC 35.7  30.0 - 36.0 g/dL   RDW 82.9  56.2 - 13.0 %   Platelets 306  150 - 400 K/uL   Neutrophils Relative 85 (*) 43 - 77 %   Neutro Abs 13.2 (*) 1.7 - 7.7 K/uL   Lymphocytes Relative 6 (*) 12 - 46 %   Lymphs Abs 1.0  0.7 - 4.0 K/uL   Monocytes Relative 8  3 - 12 %   Monocytes Absolute 1.2 (*) 0.1 - 1.0 K/uL   Eosinophils Relative 1  0 - 5 %   Eosinophils Absolute 0.1  0.0 - 0.7 K/uL   Basophils Relative 0  0 - 1 %   Basophils Absolute 0.0  0.0 - 0.1 K/uL  COMPREHENSIVE METABOLIC PANEL     Status: Abnormal   Collection Time    03/30/13  7:06 PM      Result Value Range   Sodium 134 (*) 135 - 145 mEq/L   Potassium 6.1 (*) 3.5 - 5.1 mEq/L   Comment: HEMOLYZED SPECIMEN, RESULTS MAY BE AFFECTED   Chloride 97  96 - 112 mEq/L   CO2  26  19 - 32 mEq/L   Glucose, Bld 112 (*) 70 - 99 mg/dL   BUN 17  6 - 23 mg/dL   Creatinine, Ser 8.65  0.50 - 1.35 mg/dL   Calcium 9.0  8.4 - 78.4 mg/dL   Total Protein 8.2  6.0 - 8.3 g/dL   Albumin 3.2 (*) 3.5 - 5.2 g/dL   AST 53 (*) 0 - 37 U/L   ALT 16  0 - 53 U/L   Comment: HEMOLYZED SPECIMEN, RESULTS MAY BE AFFECTED   Alkaline Phosphatase 73  39 - 117 U/L   Comment: HEMOLYZED SPECIMEN, RESULTS MAY BE AFFECTED   Total Bilirubin 0.7  0.3 - 1.2 mg/dL   GFR calc non Af Amer >90  >90 mL/min   GFR calc Af Amer >90  >90 mL/min   Comment:  The eGFR has been calculated     using the CKD EPI equation.     This calculation has not been     validated in all clinical     situations.     eGFR's persistently     <90 mL/min signify     possible Chronic Kidney Disease.  LIPASE, BLOOD     Status: None   Collection Time    03/30/13  7:06 PM      Result Value Range   Lipase 22  11 - 59 U/L   No results found.  Review of Systems  Constitutional: Positive for fever, chills and malaise/fatigue.  Respiratory: Negative for cough and shortness of breath.   Cardiovascular: Negative for chest pain.  Gastrointestinal: Positive for nausea, vomiting, abdominal pain and diarrhea. Negative for heartburn and constipation.       Lower abd pain  Genitourinary: Negative for dysuria, urgency and frequency.  Skin: Negative for rash.  Neurological: Negative for dizziness and headaches.  Endo/Heme/Allergies: Does not bruise/bleed easily.    Blood pressure 135/86, pulse 105, temperature 98.8 F (37.1 C), temperature source Oral, resp. rate 20, SpO2 95.00%. Physical Exam  Constitutional: He is oriented to person, place, and time. He appears well-developed and well-nourished. He appears distressed.  HENT:  Head: Normocephalic and atraumatic.  Eyes: Conjunctivae are normal. Pupils are equal, round, and reactive to light.  Neck: Normal range of motion.  Cardiovascular: Normal rate and regular  rhythm.   Respiratory: Effort normal and breath sounds normal.  GI: Soft. He exhibits no distension. There is tenderness. There is guarding.  Musculoskeletal: Normal range of motion.  Neurological: He is alert and oriented to person, place, and time.  Skin: Skin is warm and dry.     Assessment/Plan Alec Kelly is a 47 y.o. M that presents to the ED with worsening abd pain after being diagnosed with diverticulitis 3 days ago.  Repeat CT shows perforation and abscess.  I discussed the options with him.  Given that he is only mildly tachycardic and not hypotensive, I believe we could do a trial of IV Invanz, but plan on surgery if this did not resolve quickly.  He would like to avoid an ostomy if at all possible.  I will make him NPO with sips of clears.  I will recheck his labs in the AM.  He does have a slightly elevated wbc. He also has an elevated K.  The specimen was hemolyzed and his Cr was normal, so I do not feel that the hyperkalemia is real.  We will recheck in the AM anyway.   Alec Kelly C. 03/30/2013, 8:28 PM

## 2013-03-30 NOTE — ED Provider Notes (Signed)
I saw and evaluated the patient, reviewed the resident's note and I agree with the findings and plan.   .Face to face Exam:  General:  Awake HEENT:  Atraumatic Resp:  Normal effort Abd:  Nondistended.  Peritoneal irritation to exam Neuro:No focal weakness    Nelia Shi, MD 03/30/13 2239

## 2013-03-31 DIAGNOSIS — K5732 Diverticulitis of large intestine without perforation or abscess without bleeding: Secondary | ICD-10-CM

## 2013-03-31 DIAGNOSIS — K630 Abscess of intestine: Secondary | ICD-10-CM

## 2013-03-31 LAB — BASIC METABOLIC PANEL
CO2: 28 mEq/L (ref 19–32)
Glucose, Bld: 113 mg/dL — ABNORMAL HIGH (ref 70–99)
Potassium: 3.8 mEq/L (ref 3.5–5.1)
Sodium: 135 mEq/L (ref 135–145)

## 2013-03-31 LAB — CBC
HCT: 39.5 % (ref 39.0–52.0)
Hemoglobin: 13.7 g/dL (ref 13.0–17.0)
MCH: 30.6 pg (ref 26.0–34.0)
MCV: 88.2 fL (ref 78.0–100.0)
RBC: 4.48 MIL/uL (ref 4.22–5.81)

## 2013-03-31 NOTE — Care Management Note (Signed)
    Page 1 of 1   03/31/2013     11:59:44 AM   CARE MANAGEMENT NOTE 03/31/2013  Patient:  Alec Kelly, Alec Kelly   Account Number:  [id number]  Date Initiated:  03/31/2013  Documentation initiated by:  Lorenda Ishihara  Subjective/Objective Assessment:   47 yo male admitted with diverticulitis, abscess. PTA lived at home with spouse.     Action/Plan:   Home when stable   Anticipated DC Date:  03/31/2013   Anticipated DC Plan:  HOME/SELF CARE      DC Planning Services  CM consult      Choice offered to / List presented to:             Status of service:  Completed, signed off Medicare Important Message given?   (If response is "NO", the following Medicare IM given date fields will be blank) Date Medicare IM given:   Date Additional Medicare IM given:    Discharge Disposition:  HOME/SELF CARE  Per UR Regulation:  Reviewed for med. necessity/level of care/duration of stay  If discussed at Long Length of Stay Meetings, dates discussed:    Comments:

## 2013-03-31 NOTE — Progress Notes (Signed)
Subjective: Pt feels better today, but pain still severe with any movement.  Pt wanting some ice chips.  Pt having BM's, flatus and normal urination.  Pt laying in bed not ambulating due to pain.    Objective: Vital signs in last 24 hours: Temp:  [98.4 F (36.9 C)-99.1 F (37.3 C)] 98.4 F (36.9 C) (04/17 0513) Pulse Rate:  [89-108] 98 (04/17 0513) Resp:  [18-20] 18 (04/17 0513) BP: (101-135)/(63-86) 101/63 mmHg (04/17 0513) SpO2:  [93 %-95 %] 95 % (04/17 0513) Weight:  [265 lb (120.203 kg)] 265 lb (120.203 kg) (04/16 2206) Last BM Date: 03/31/13  Intake/Output from previous day: 04/16 0701 - 04/17 0700 In: 1812.5 [I.V.:1812.5] Out: 0  Intake/Output this shift:    PE: Gen:  Alert, NAD, pleasant Abd: Soft, moderate tenderness throughout, ND, +BS, no HSM, large horizontal scar in RLQ   Lab Results:   Recent Labs  03/30/13 1906 03/31/13 0418  WBC 15.5* 13.1*  HGB 15.3 13.7  HCT 42.8 39.5  PLT 306 257   BMET  Recent Labs  03/30/13 1906 03/30/13 2125 03/31/13 0418  NA 134*  --  135  K 6.1* 3.7 3.8  CL 97  --  98  CO2 26  --  28  GLUCOSE 112*  --  113*  BUN 17  --  15  CREATININE 0.97  --  1.09  CALCIUM 9.0  --  8.3*   PT/INR No results found for this basename: LABPROT, INR,  in the last 72 hours CMP     Component Value Date/Time   NA 135 03/31/2013 0418   K 3.8 03/31/2013 0418   CL 98 03/31/2013 0418   CO2 28 03/31/2013 0418   GLUCOSE 113* 03/31/2013 0418   BUN 15 03/31/2013 0418   CREATININE 1.09 03/31/2013 0418   CALCIUM 8.3* 03/31/2013 0418   PROT 8.2 03/30/2013 1906   ALBUMIN 3.2* 03/30/2013 1906   AST 53* 03/30/2013 1906   ALT 16 03/30/2013 1906   ALKPHOS 73 03/30/2013 1906   BILITOT 0.7 03/30/2013 1906   GFRNONAA 80* 03/31/2013 0418   GFRAA >90 03/31/2013 0418   Lipase     Component Value Date/Time   LIPASE 22 03/30/2013 1906       Studies/Results: Ct Abdomen Pelvis W Contrast  03/30/2013  *RADIOLOGY REPORT*  Clinical Data: Left lower  quadrant abdominal pain, history of diverticulitis.  Prior appendectomy.  CT ABDOMEN AND PELVIS WITH CONTRAST  Technique:  Multidetector CT imaging of the abdomen and pelvis was performed following the standard protocol during bolus administration of intravenous contrast.  Contrast: 50mL OMNIPAQUE IOHEXOL 300 MG/ML  SOLN, OMNIPAQUE IOHEXOL 300 MG/ML  SOLN  Comparison: 03/27/2013  Findings: Dependent bibasilar atelectasis noted.  There is new intra-abdominal free air as well as gas tracking within the mesentery and within a pericolonic rim enhancing fluid collection measuring 4.0 x 2.9 cm image 71, adjacent to the previously seen thickened segment of sigmoid colon.  There are other rim enhancing fluid collections within the pelvis, contiguous with pericolonic stranding, measuring 4.2 x 3.4 cm image 76 and 4.3 x 2.7 cm image 82, for example.  Subjectively, there is no significant change in the degree of sigmoid colonic wall thickening, however associated pericolonic stranding and other inflammatory change has increased.  High density material within the gallbladder is compatible with vicarious excretion of contrast or less likely sludge.  2 mm nonobstructing left upper renal pole calculus is stable.  A few left upper quadrant loops of  jejunum measure 2.8 cm, upper limits of normal, but there is no evidence for obstruction to distal passage of contrast.  IMPRESSION: Interval sigmoid colonic perforation secondary to diverticulitis with multiple pericolonic/pelvic rim enhancing fluid collections compatible with abscess formation.  Critical Value/emergent results were called by telephone at the time of interpretation on 03/30/2013 at 9:45 p.m. to Dr. Radford Pax, who verbally acknowledged these results.   Original Report Authenticated By: Christiana Pellant, M.D.     Anti-infectives: Anti-infectives   Start     Dose/Rate Route Frequency Ordered Stop   03/30/13 2230  ertapenem [facility]) 1 g in sodium chloride 0.9 % 50 mL  IVPB     1 g 100 mL/hr over 30 Minutes Intravenous Daily at bedtime 03/30/13 2156     03/30/13 1945  metroNIDAZOLE (FLAGYL) IVPB 500 mg     500 mg 100 mL/hr over 60 Minutes Intravenous  Once 03/30/13 1940 03/30/13 2125   03/30/13 1945  ciprofloxacin (CIPRO) IVPB 400 mg     400 mg 200 mL/hr over 60 Minutes Intravenous  Once 03/30/13 1940 03/30/13 2024       Assessment/Plan Diverticulitis with perforation and abscess 1.  Will try conservative treatment - Invanz (failed cipro/flagyl), IVF, pain control, antiemetics, NPO 2.  Ambulation and IS 3.  SCD's and lovenox 4.  If conservative treatment does not work then will need to go to OR and likely need ostomy which the patient is trying to avoid, patient understands this plan  Leukocytosis - repeat labs in am    LOS: 1 day    DORT, Osa Campoli 03/31/2013, 8:29 AM Pager: [phone]

## 2013-04-01 ENCOUNTER — Inpatient Hospital Stay (HOSPITAL_COMMUNITY): Payer: MEDICAID

## 2013-04-01 ENCOUNTER — Encounter (HOSPITAL_COMMUNITY): Payer: Self-pay | Admitting: Radiology

## 2013-04-01 LAB — CBC
HCT: 41.7 % (ref 39.0–52.0)
MCH: 30.6 pg (ref 26.0–34.0)
MCHC: 34.1 g/dL (ref 30.0–36.0)
MCV: 89.9 fL (ref 78.0–100.0)
RDW: 12.9 % (ref 11.5–15.5)

## 2013-04-01 LAB — BASIC METABOLIC PANEL
BUN: 17 mg/dL (ref 6–23)
Chloride: 101 mEq/L (ref 96–112)
Creatinine, Ser: 1.08 mg/dL (ref 0.50–1.35)
GFR calc Af Amer: >90 mL/min (ref >90)
GFR calc non Af Amer: 81 mL/min — ABNORMAL LOW (ref >90)

## 2013-04-01 MED ORDER — METRONIDAZOLE IN NACL 5-0.79 MG/ML-% IV SOLN
500.0000 mg | Freq: Three times a day (TID) | INTRAVENOUS | Status: DC
  Administered 2013-04-01 – 2013-04-05 (×11): 500 mg via INTRAVENOUS
  Filled 2013-04-01 (×12): qty 100

## 2013-04-01 MED ORDER — CALCIUM CARB-CHOLECALCIFEROL 600-400 MG-UNIT PO CHEW: 1.0000 | CHEWABLE_TABLET | Freq: Two times a day (BID) | ORAL | Status: DC

## 2013-04-01 MED ORDER — IOHEXOL 300 MG/ML  SOLN
100.0000 mL | Freq: Once | INTRAMUSCULAR | Status: AC | PRN
  Administered 2013-04-01: 100 mL via INTRAVENOUS

## 2013-04-01 MED ORDER — IOHEXOL 300 MG/ML  SOLN
50.0000 mL | Freq: Once | INTRAMUSCULAR | Status: AC | PRN
  Administered 2013-04-01: 50 mL via ORAL

## 2013-04-01 NOTE — Progress Notes (Signed)
Subjective: Pt still with moderate pain, although he says it is less.  He can now lay on his abdomen.  Objective: Vital signs in last 24 hours: Temp:  [97.9 F (36.6 C)-99.6 F (37.6 C)] 98.1 F (36.7 C) (04/18 0511) Pulse Rate:  [89-100] 91 (04/18 0511) Resp:  [18] 18 (04/18 0511) BP: (112-125)/(63-85) 122/85 mmHg (04/18 0511) SpO2:  [93 %-97 %] 96 % (04/18 0511) Last BM Date: 03/31/13  Intake/Output from previous day: 04/17 0701 - 04/18 0700 In: 2500 [I.V.:2500] Out: 1200 [Urine:1200] Intake/Output this shift:    Abdomen soft and obese Moderate tenderness with guarding in the LLQ  Lab Results:   Recent Labs  03/31/13 0418 04/01/13 0421  WBC 13.1* 13.6*  HGB 13.7 14.2  HCT 39.5 41.7  PLT 257 313   BMET  Recent Labs  03/31/13 0418 04/01/13 0421  NA 135 137  K 3.8 4.0  CL 98 101  CO2 28 28  GLUCOSE 113* 100*  BUN 15 17  CREATININE 1.09 1.08  CALCIUM 8.3* 8.8   PT/INR No results found for this basename: LABPROT, INR,  in the last 72 hours ABG No results found for this basename: PHART, PCO2, PO2, HCO3,  in the last 72 hours  Studies/Results: Ct Abdomen Pelvis W Contrast  03/30/2013  *RADIOLOGY REPORT*  Clinical Data: Left lower quadrant abdominal pain, history of diverticulitis.  Prior appendectomy.  CT ABDOMEN AND PELVIS WITH CONTRAST  Technique:  Multidetector CT imaging of the abdomen and pelvis was performed following the standard protocol during bolus administration of intravenous contrast.  Contrast: 50mL OMNIPAQUE IOHEXOL 300 MG/ML  SOLN, OMNIPAQUE IOHEXOL 300 MG/ML  SOLN  Comparison: 03/27/2013  Findings: Dependent bibasilar atelectasis noted.  There is new intra-abdominal free air as well as gas tracking within the mesentery and within a pericolonic rim enhancing fluid collection measuring 4.0 x 2.9 cm image 71, adjacent to the previously seen thickened segment of sigmoid colon.  There are other rim enhancing fluid collections within the  pelvis, contiguous with pericolonic stranding, measuring 4.2 x 3.4 cm image 76 and 4.3 x 2.7 cm image 82, for example.  Subjectively, there is no significant change in the degree of sigmoid colonic wall thickening, however associated pericolonic stranding and other inflammatory change has increased.  High density material within the gallbladder is compatible with vicarious excretion of contrast or less likely sludge.  2 mm nonobstructing left upper renal pole calculus is stable.  A few left upper quadrant loops of jejunum measure 2.8 cm, upper limits of normal, but there is no evidence for obstruction to distal passage of contrast.  IMPRESSION: Interval sigmoid colonic perforation secondary to diverticulitis with multiple pericolonic/pelvic rim enhancing fluid collections compatible with abscess formation.  Critical Value/emergent results were called by telephone at the time of interpretation on 03/30/2013 at 9:45 p.m. to Dr. Radford Pax, who verbally acknowledged these results.   Original Report Authenticated By: Christiana Pellant, M.D.     Anti-infectives: Anti-infectives   Start     Dose/Rate Route Frequency Ordered Stop   03/30/13 2230  ertapenem [facility]) 1 g in sodium chloride 0.9 % 50 mL IVPB     1 g 100 mL/hr over 30 Minutes Intravenous Daily at bedtime 03/30/13 2156     03/30/13 1945  metroNIDAZOLE (FLAGYL) IVPB 500 mg     500 mg 100 mL/hr over 60 Minutes Intravenous  Once 03/30/13 1940 03/30/13 2125   03/30/13 1945  ciprofloxacin (CIPRO) IVPB 400 mg     400  mg 200 mL/hr over 60 Minutes Intravenous  Once 03/30/13 1940 03/30/13 2024      Assessment/Plan: s/p * No surgery found * Perforated sigmoid diverticulitis with abscess  Will repeat CT today to see if collections have changed and whether any can be drained by IR.  If not, I suspect he is leaning toward exp lap and colostomy Continue IV antibiotics  LOS: 2 days    Corben Auzenne A 04/01/2013

## 2013-04-01 NOTE — Progress Notes (Signed)
Patient ID: Alec Kelly, male   DOB: Feb 08, 1966, 47 y.o.   MRN: [id number] Patient reports pain slightly improved Passing flatus  Abdomen still with focal left sided moderate tenderness and guarding  CT shows most anterior abscess has contrast in it.  The pelvic collections are minimally changed.  The free air is less  I am hesitant to place drains now for fear of creating a fistula.  Will add flagyl.  Will need eventual exp lap and probable colostomy, but with clinical improvement, he may warrant a bowel prep.  Will discuss with partners.

## 2013-04-02 ENCOUNTER — Inpatient Hospital Stay (HOSPITAL_COMMUNITY): Payer: MEDICAID

## 2013-04-02 ENCOUNTER — Encounter (HOSPITAL_COMMUNITY): Payer: Self-pay | Admitting: Radiology

## 2013-04-02 LAB — CBC
HCT: 38.3 % — ABNORMAL LOW (ref 39.0–52.0)
MCHC: 34.2 g/dL (ref 30.0–36.0)
RDW: 12.8 % (ref 11.5–15.5)

## 2013-04-02 LAB — PROTIME-INR: INR: 1.16 (ref 0.00–1.49)

## 2013-04-02 MED ORDER — FENTANYL CITRATE 0.05 MG/ML IJ SOLN
INTRAMUSCULAR | Status: AC | PRN
  Administered 2013-04-02: 50 ug via INTRAVENOUS
  Administered 2013-04-02: 100 ug via INTRAVENOUS

## 2013-04-02 MED ORDER — FENTANYL CITRATE 0.05 MG/ML IJ SOLN
INTRAMUSCULAR | Status: AC
  Filled 2013-04-02: qty 6

## 2013-04-02 MED ORDER — INDOMETHACIN ER 75 MG PO CPCR
75.0000 mg | ORAL_CAPSULE | Freq: Once | ORAL | Status: AC
  Administered 2013-04-02: 75 mg via ORAL
  Filled 2013-04-02: qty 1

## 2013-04-02 MED ORDER — MIDAZOLAM HCL 2 MG/2ML IJ SOLN
INTRAMUSCULAR | Status: AC | PRN
  Administered 2013-04-02: 0.5 mg via INTRAVENOUS
  Administered 2013-04-02: 1 mg via INTRAVENOUS

## 2013-04-02 MED ORDER — MIDAZOLAM HCL 2 MG/2ML IJ SOLN
INTRAMUSCULAR | Status: AC
  Filled 2013-04-02: qty 6

## 2013-04-02 NOTE — Progress Notes (Signed)
Patient ID: Alec Kelly, male   DOB: 1966/03/30, 47 y.o.   MRN: [id number]    Subjective: Feels about the same. No change in abdominal pain, no better or worse. Localized mainly in the left lower quadrant. Also complaining of pain and tenderness in his feet. Has history of gout. Has passed flatus but no bowel movements.  Objective: Vital signs in last 24 hours: Temp:  [99.3 F (37.4 C)-99.6 F (37.6 C)] 99.4 F (37.4 C) (04/19 0549) Pulse Rate:  [92-102] 102 (04/19 0549) Resp:  [18] 18 (04/19 0549) BP: (125-134)/(85-89) 125/85 mmHg (04/19 0549) SpO2:  [94 %-97 %] 97 % (04/19 0549) Last BM Date: 03/31/13  Intake/Output from previous day: 04/18 0701 - 04/19 0700 In: 3731.3 [I.V.:3481.3; IV Piggyback:250] Out: 1800 [Urine:1800] Intake/Output this shift:    General appearance: alert, cooperative, mild distress and morbidly obese Resp: clear to auscultation bilaterally and no increased work of breathing GI: tender across the lower abdomen, left greater than right with some guarding. Upper abdomen nontender.  Lab Results:   Recent Labs  04/01/13 0421 04/02/13 0439  WBC 13.6* 11.0*  HGB 14.2 13.1  HCT 41.7 38.3*  PLT 313 312   BMET  Recent Labs  03/31/13 0418 04/01/13 0421  NA 135 137  K 3.8 4.0  CL 98 101  CO2 28 28  GLUCOSE 113* 100*  BUN 15 17  CREATININE 1.09 1.08  CALCIUM 8.3* 8.8     Studies/Results: Ct Abdomen Pelvis W Contrast  04/01/2013  *RADIOLOGY REPORT*  Clinical Data: Perforated diverticulitis with abscess.  CT ABDOMEN AND PELVIS WITH CONTRAST  Technique:  Multidetector CT imaging of the abdomen and pelvis was performed following the standard protocol during bolus administration of intravenous contrast.  Contrast: OMNIPAQUE IOHEXOL 300 MG/ML  SOLN  Comparison: 03/30/2013  Findings: Subsegmental atelectasis is noted in the dependent lung bases.  No focal abnormality seen in the liver or spleen.  There is circumferential wall thickening in the  distal esophagus and the date diverticulum or lymph node adjacent to the distal esophagus seen on image 7.  This is stable since the exam from 09/25/2012  Stomach, duodenum, pancreas, and adrenal glands are unremarkable. High attenuation layering in the gallbladder lumen and is likely related to a vicarious excretion of contrast.  Kidneys are unremarkable.  No abdominal aortic aneurysm.  No free fluid or bulky lymphadenopathy in the abdomen.  The amount of intraperitoneal free air has decreased since the prior study.  Imaging through the pelvis again shows edema/inflammation around the proximal sigmoid colon.  Abscess cavity anterior to the colon fills with contrast on the current exam and measures slightly smaller than previously, at 3.4 x 2.4 cm today compared to 4.0 x 2.9 cm previously.  There is wall thickening in the proximal and mid sigmoid colon, as before.  Mild lymphadenopathy in the central left pelvis is stable.  Focal fluid collection with subtle rim enhancement in the left central pelvis measures 4.7 x 3.2 cm today compared to 4.2 x 3.4 cm previously.  A second fluid collection posterior to the bladder in the lower central pelvis measures 3.4 x 4.3 cm today compared  to 2.7 x 4.3 cm previously.  The terminal ileum is normal. The appendix is not visualized, but there is no edema or inflammation in the region of the cecum.  Left inguinal hernia contains only fat. Bone windows reveal no worrisome lytic or sclerotic osseous lesions.  IMPRESSION: Slight interval decrease in the volume of intraperitoneal  free air with contrast filling of an abscess cavity anterior to the proximal sigmoid colon.  Two discrete fluid collections in the central pelvis persist without substantial change and suggest evolving abscesses.  Circumferential wall thickening in the distal esophagus.  This is not substantially changed since 09/25/2012.  Esophagitis or neoplasm could have this appearance.   Original Report Authenticated By:  Kennith Center, M.D.     Anti-infectives: Anti-infectives   Start     Dose/Rate Route Frequency Ordered Stop   04/01/13 1800  metroNIDAZOLE (FLAGYL) IVPB 500 mg     500 mg 100 mL/hr over 60 Minutes Intravenous Every 8 hours 04/01/13 1737     03/30/13 2230  ertapenem (INVANZ) 1 g in sodium chloride 0.9 % 50 mL IVPB     1 g 100 mL/hr over 30 Minutes Intravenous Daily at bedtime 03/30/13 2156     03/30/13 1945  metroNIDAZOLE (FLAGYL) IVPB 500 mg     500 mg 100 mL/hr over 60 Minutes Intravenous  Once 03/30/13 1940 03/30/13 2125   03/30/13 1945  ciprofloxacin (CIPRO) IVPB 400 mg     400 mg 200 mL/hr over 60 Minutes Intravenous  Once 03/30/13 1940 03/30/13 2024      Assessment/Plan: Severe sigmoid diverticulitis with perforation and several pelvic abscesses these. Followup CT scan has shown improvement and a small amount of free air in the upper abdomen but enlargement of 2 of his pelvic abscesses and contrast and a third anterior abscess that is smaller. He does not have evidence of worsening sepsis. Vital signs are stable, white count is decreasing and his tenderness is localized. I discussed with interventional radiology. I recommended percutaneous drainage of the larger pelvic abscesses and they feel this is feasible. This will be scheduled for later today. I discussed with the patient alternatives including emergency surgery versus nonoperative management with antibiotics and for drain to avoid emergency surgery likely colostomy. He understands and agrees with the present plan. History of gout and bilateral foot pain which he states is typical for his gout. Slight swelling in the feet no inflammatory change. We will try a single dose of Indocin today.    LOS: 3 days    Alec Kelly T 04/02/2013

## 2013-04-02 NOTE — Progress Notes (Signed)
Patient ID: Alec Kelly, male   DOB: 1966/05/06, 47 y.o.   MRN: [id number] Request received for CT guided drainage of pelvic/diverticular abscess(es) on pt. Imaging studies have been reviewed by Dr. Archer Asa. Additional PMH as below. Exam: pt awake/alert; chest- CTA bilat; heart-RRR ; abd- obese, soft,+BS, tender lower quadrants L>R.   Filed Vitals:   04/01/13 0511 04/01/13 1400 04/01/13 2136 04/02/13 0549  BP: 122/85 129/89 134/88 125/85  Pulse: 91 93 92 102  Temp: 98.1 F (36.7 C) 99.6 F (37.6 C) 99.3 F (37.4 C) 99.4 F (37.4 C)  TempSrc: Oral Oral Oral Oral  Resp: 18 18 18 18   Height:      Weight:      SpO2: 96% 97% 94% 97%   Past Medical History  Diagnosis Date  . Kidney stones   . Diverticulitis    Past Surgical History  Procedure Laterality Date  . Tonsillectomy    . Appendectomy    . Hernia repair    . Lithotripsy    Ct Abdomen Pelvis W Contrast  04/01/2013  *RADIOLOGY REPORT*  Clinical Data: Perforated diverticulitis with abscess.  CT ABDOMEN AND PELVIS WITH CONTRAST  Technique:  Multidetector CT imaging of the abdomen and pelvis was performed following the standard protocol during bolus administration of intravenous contrast.  Contrast: OMNIPAQUE IOHEXOL 300 MG/ML  SOLN  Comparison: 03/30/2013  Findings: Subsegmental atelectasis is noted in the dependent lung bases.  No focal abnormality seen in the liver or spleen.  There is circumferential wall thickening in the distal esophagus and the date diverticulum or lymph node adjacent to the distal esophagus seen on image 7.  This is stable since the exam from 09/25/2012  Stomach, duodenum, pancreas, and adrenal glands are unremarkable. High attenuation layering in the gallbladder lumen and is likely related to a vicarious excretion of contrast.  Kidneys are unremarkable.  No abdominal aortic aneurysm.  No free fluid or bulky lymphadenopathy in the abdomen.  The amount of intraperitoneal free air has decreased since the  prior study.  Imaging through the pelvis again shows edema/inflammation around the proximal sigmoid colon.  Abscess cavity anterior to the colon fills with contrast on the current exam and measures slightly smaller than previously, at 3.4 x 2.4 cm today compared to 4.0 x 2.9 cm previously.  There is wall thickening in the proximal and mid sigmoid colon, as before.  Mild lymphadenopathy in the central left pelvis is stable.  Focal fluid collection with subtle rim enhancement in the left central pelvis measures 4.7 x 3.2 cm today compared to 4.2 x 3.4 cm previously.  A second fluid collection posterior to the bladder in the lower central pelvis measures 3.4 x 4.3 cm today compared  to 2.7 x 4.3 cm previously.  The terminal ileum is normal. The appendix is not visualized, but there is no edema or inflammation in the region of the cecum.  Left inguinal hernia contains only fat. Bone windows reveal no worrisome lytic or sclerotic osseous lesions.  IMPRESSION: Slight interval decrease in the volume of intraperitoneal free air with contrast filling of an abscess cavity anterior to the proximal sigmoid colon.  Two discrete fluid collections in the central pelvis persist without substantial change and suggest evolving abscesses.  Circumferential wall thickening in the distal esophagus.  This is not substantially changed since 09/25/2012.  Esophagitis or neoplasm could have this appearance.   Original Report Authenticated By: Kennith Center, M.D.    Ct Abdomen Pelvis W Contrast  03/30/2013  *  RADIOLOGY REPORT*  Clinical Data: Left lower quadrant abdominal pain, history of diverticulitis.  Prior appendectomy.  CT ABDOMEN AND PELVIS WITH CONTRAST  Technique:  Multidetector CT imaging of the abdomen and pelvis was performed following the standard protocol during bolus administration of intravenous contrast.  Contrast: 50mL OMNIPAQUE IOHEXOL 300 MG/ML  SOLN, OMNIPAQUE IOHEXOL 300 MG/ML  SOLN  Comparison: 03/27/2013   Findings: Dependent bibasilar atelectasis noted.  There is new intra-abdominal free air as well as gas tracking within the mesentery and within a pericolonic rim enhancing fluid collection measuring 4.0 x 2.9 cm image 71, adjacent to the previously seen thickened segment of sigmoid colon.  There are other rim enhancing fluid collections within the pelvis, contiguous with pericolonic stranding, measuring 4.2 x 3.4 cm image 76 and 4.3 x 2.7 cm image 82, for example.  Subjectively, there is no significant change in the degree of sigmoid colonic wall thickening, however associated pericolonic stranding and other inflammatory change has increased.  High density material within the gallbladder is compatible with vicarious excretion of contrast or less likely sludge.  2 mm nonobstructing left upper renal pole calculus is stable.  A few left upper quadrant loops of jejunum measure 2.8 cm, upper limits of normal, but there is no evidence for obstruction to distal passage of contrast.  IMPRESSION: Interval sigmoid colonic perforation secondary to diverticulitis with multiple pericolonic/pelvic rim enhancing fluid collections compatible with abscess formation.  Critical Value/emergent results were called by telephone at the time of interpretation on 03/30/2013 at 9:45 p.m. to Dr. Radford Pax, who verbally acknowledged these results.   Original Report Authenticated By: Christiana Pellant, M.D.    Ct Abdomen Pelvis W Contrast  03/27/2013  *RADIOLOGY REPORT*  Clinical Data: Left lower quadrant pain.  CT ABDOMEN AND PELVIS WITH CONTRAST  Technique:  Multidetector CT imaging of the abdomen and pelvis was performed following the standard protocol during bolus administration of intravenous contrast.  Contrast: 50mL OMNIPAQUE IOHEXOL 300 MG/ML  SOLN, OMNIPAQUE IOHEXOL 300 MG/ML  SOLN  Comparison: CT scan dated 09/25/2012  Findings: There is a 10 cm segment of acute diverticulitis of the proximal sigmoid portion of the colon.  Mucosa  is thickened and there is pericolonic soft tissue inflammation.  There is no free air or free fluid.  No diverticular abscess.  This is the same area of diverticulitis as on the prior exam.  The liver, biliary tree, spleen, pancreas, adrenal glands, and kidneys are normal except for a 4 mm stone in the upper pole of the left kidney.  There are several diverticula in the cecum.  IMPRESSION: Recurrent diverticulitis of the proximal sigmoid portion of the colon.   Original Report Authenticated By: Francene Boyers, M.D.   Results for orders placed during the hospital encounter of 03/30/13  CBC WITH DIFFERENTIAL      Result Value Range   WBC 15.5 (*) 4.0 - 10.5 K/uL   RBC 4.87  4.22 - 5.81 MIL/uL   Hemoglobin 15.3  13.0 - 17.0 g/dL   HCT 16.1  09.6 - 04.5 %   MCV 87.9  78.0 - 100.0 fL   MCH 31.4  26.0 - 34.0 pg   MCHC 35.7  30.0 - 36.0 g/dL   RDW 40.9  81.1 - 91.4 %   Platelets 306  150 - 400 K/uL   Neutrophils Relative 85 (*) 43 - 77 %   Neutro Abs 13.2 (*) 1.7 - 7.7 K/uL   Lymphocytes Relative 6 (*) 12 - 46 %  Lymphs Abs 1.0  0.7 - 4.0 K/uL   Monocytes Relative 8  3 - 12 %   Monocytes Absolute 1.2 (*) 0.1 - 1.0 K/uL   Eosinophils Relative 1  0 - 5 %   Eosinophils Absolute 0.1  0.0 - 0.7 K/uL   Basophils Relative 0  0 - 1 %   Basophils Absolute 0.0  0.0 - 0.1 K/uL  COMPREHENSIVE METABOLIC PANEL      Result Value Range   Sodium 134 (*) 135 - 145 mEq/L   Potassium 6.1 (*) 3.5 - 5.1 mEq/L   Chloride 97  96 - 112 mEq/L   CO2 26  19 - 32 mEq/L   Glucose, Bld 112 (*) 70 - 99 mg/dL   BUN 17  6 - 23 mg/dL   Creatinine, Ser 1.61  0.50 - 1.35 mg/dL   Calcium 9.0  8.4 - 09.6 mg/dL   Total Protein 8.2  6.0 - 8.3 g/dL   Albumin 3.2 (*) 3.5 - 5.2 g/dL   AST 53 (*) 0 - 37 U/L   ALT 16  0 - 53 U/L   Alkaline Phosphatase 73  39 - 117 U/L   Total Bilirubin 0.7  0.3 - 1.2 mg/dL   GFR calc non Af Amer >90  >90 mL/min   GFR calc Af Amer >90  >90 mL/min  LIPASE, BLOOD      Result Value Range   Lipase  22  11 - 59 U/L  POTASSIUM      Result Value Range   Potassium 3.7  3.5 - 5.1 mEq/L  BASIC METABOLIC PANEL      Result Value Range   Sodium 135  135 - 145 mEq/L   Potassium 3.8  3.5 - 5.1 mEq/L   Chloride 98  96 - 112 mEq/L   CO2 28  19 - 32 mEq/L   Glucose, Bld 113 (*) 70 - 99 mg/dL   BUN 15  6 - 23 mg/dL   Creatinine, Ser 0.45  0.50 - 1.35 mg/dL   Calcium 8.3 (*) 8.4 - 10.5 mg/dL   GFR calc non Af Amer 80 (*) >90 mL/min   GFR calc Af Amer >90  >90 mL/min  CBC      Result Value Range   WBC 13.1 (*) 4.0 - 10.5 K/uL   RBC 4.48  4.22 - 5.81 MIL/uL   Hemoglobin 13.7  13.0 - 17.0 g/dL   HCT 40.9  81.1 - 91.4 %   MCV 88.2  78.0 - 100.0 fL   MCH 30.6  26.0 - 34.0 pg   MCHC 34.7  30.0 - 36.0 g/dL   RDW 78.2  95.6 - 21.3 %   Platelets 257  150 - 400 K/uL  CBC      Result Value Range   WBC 13.6 (*) 4.0 - 10.5 K/uL   RBC 4.64  4.22 - 5.81 MIL/uL   Hemoglobin 14.2  13.0 - 17.0 g/dL   HCT 08.6  57.8 - 46.9 %   MCV 89.9  78.0 - 100.0 fL   MCH 30.6  26.0 - 34.0 pg   MCHC 34.1  30.0 - 36.0 g/dL   RDW 62.9  52.8 - 41.3 %   Platelets 313  150 - 400 K/uL  BASIC METABOLIC PANEL      Result Value Range   Sodium 137  135 - 145 mEq/L   Potassium 4.0  3.5 - 5.1 mEq/L   Chloride 101  96 - 112 mEq/L  CO2 28  19 - 32 mEq/L   Glucose, Bld 100 (*) 70 - 99 mg/dL   BUN 17  6 - 23 mg/dL   Creatinine, Ser 2.84  0.50 - 1.35 mg/dL   Calcium 8.8  8.4 - 13.2 mg/dL   GFR calc non Af Amer 81 (*) >90 mL/min   GFR calc Af Amer >90  >90 mL/min  CBC      Result Value Range   WBC 11.0 (*) 4.0 - 10.5 K/uL   RBC 4.29  4.22 - 5.81 MIL/uL   Hemoglobin 13.1  13.0 - 17.0 g/dL   HCT 44.0 (*) 10.2 - 72.5 %   MCV 89.3  78.0 - 100.0 fL   MCH 30.5  26.0 - 34.0 pg   MCHC 34.2  30.0 - 36.0 g/dL   RDW 36.6  44.0 - 34.7 %   Platelets 312  150 - 400 K/uL   A/P: Pt with abd pain, mild leukocytosis, low grade fever and evidence of diverticular abscesses on recent CT. Plan is for CT guided drainage of largest  abscess(es) today. Details/risks of procedure d/w pt with his understanding and consent.

## 2013-04-02 NOTE — Procedures (Signed)
Interventional Radiology Procedure Note  Procedure: CT guided abscess drain placed in deep perirectal abscess via right transgluteal approach.  15 mL purulent fluid aspirated and sent for culture. Complications: None Recommendations: - Maintain to bulb suction - Cultures pending - Once pt clinically improved and drainage is minimal, consider CT scan and tube injection prior to removal of tube.  Signed,  Sterling Big, MD Vascular & Interventional Radiologist [facility] Radiology

## 2013-04-02 NOTE — Progress Notes (Signed)
Agree with PA note.    Signed,  Labrina Lines K. Yatzary Merriweather, MD Vascular & Interventional Radiologist [facility]  

## 2013-04-03 LAB — BASIC METABOLIC PANEL
BUN: 16 mg/dL (ref 6–23)
CO2: 29 mEq/L (ref 19–32)
Chloride: 101 mEq/L (ref 96–112)
GFR calc non Af Amer: >90 mL/min (ref >90)
Glucose, Bld: 102 mg/dL — ABNORMAL HIGH (ref 70–99)
Potassium: 3.7 mEq/L (ref 3.5–5.1)
Sodium: 136 mEq/L (ref 135–145)

## 2013-04-03 LAB — CBC
HCT: 38 % — ABNORMAL LOW (ref 39.0–52.0)
Hemoglobin: 12.8 g/dL — ABNORMAL LOW (ref 13.0–17.0)
MCHC: 33.7 g/dL (ref 30.0–36.0)
RBC: 4.31 MIL/uL (ref 4.22–5.81)
WBC: 9.9 10*3/uL (ref 4.0–10.5)

## 2013-04-03 MED ORDER — INDOMETHACIN ER 75 MG PO CPCR
75.0000 mg | ORAL_CAPSULE | Freq: Once | ORAL | Status: AC
  Administered 2013-04-03: 75 mg via ORAL
  Filled 2013-04-03: qty 1

## 2013-04-03 NOTE — Progress Notes (Signed)
Subjective: Pt resting quietly; abd pain sl decreased since yesterday; tol diet ok  Objective: Vital signs in last 24 hours: Temp:  [98.3 F (36.8 C)-98.9 F (37.2 C)] 98.9 F (37.2 C) (04/20 0530) Pulse Rate:  [90-110] 97 (04/20 0530) Resp:  [16-28] 20 (04/20 0530) BP: (112-143)/(74-89) 117/74 mmHg (04/20 0530) SpO2:  [94 %-98 %] 94 % (04/20 0530) Last BM Date: 04/02/13  Intake/Output from previous day: 04/19 0701 - 04/20 0700 In: 3397.5 [I.V.:3037.5; IV Piggyback:350] Out: 1210 [Urine:1200; Drains:10] Intake/Output this shift: Total I/O In: 240 [P.O.:240] Out: 0   Right TG drain intact, few cc's of serous fluid in JP bulb, cx's pend  Lab Results:   Recent Labs  04/02/13 0439 04/03/13 0446  WBC 11.0* 9.9  HGB 13.1 12.8*  HCT 38.3* 38.0*  PLT 312 292   BMET  Recent Labs  04/01/13 0421 04/03/13 0446  NA 137 136  K 4.0 3.7  CL 101 101  CO2 28 29  GLUCOSE 100* 102*  BUN 17 16  CREATININE 1.08 0.93  CALCIUM 8.8 8.2*   PT/INR  Recent Labs  04/02/13 1210  LABPROT 14.6  INR 1.16   ABG No results found for this basename: PHART, PCO2, PO2, HCO3,  in the last 72 hours  Studies/Results: Ct Guided Abscess Drain  04/02/2013  *RADIOLOGY REPORT*  CT GUIDED ABSCESS DRAIN  Date: 04/02/2013  Clinical History: 47 year old male with perforated diverticulitis and deep pelvic abscesses.  CT guided drain placement is requested.  Procedures Performed: 1. CT guided placement of a 12-French drain  Interventional Radiologist:  Sterling Big, MD  Sedation: Moderate (conscious) sedation was used.  2.5 mg Versed, 150 mcg Fentanyl were administered intravenously.  The patient's vital signs were monitored continuously by radiology nursing throughout the procedure.  Sedation Time: 28 minutes  PROCEDURE/FINDINGS:   Informed consent was obtained from the patient following explanation of the procedure, risks, benefits and alternatives. The patient understands, agrees and  consents for the procedure. All questions were addressed. A time out was performed.  Maximal barrier sterile technique utilized including caps, mask, sterile gowns, sterile gloves, large sterile drape, hand hygiene, and betadine skin prep.  A planning axial CT scan was performed.  The fluid collection just to the right of the rectosigmoid junction is successfully identified.  There is a very narrow window.  Local anesthesia was achieved by infiltration of 1% lidocaine.  A 20 cm 18 gauge trocar needle was carefully advanced via a right transgluteal approach through the sacrospinous ligament and into the peri-sigmoid fluid collection.  And no 35 wire was advanced and coiled within the fluid collection.  Positioning of wire was confirmed with axial CT imaging.  The skin tract was then serially dilated to 12-French.  A 12 [place] Cook all-purpose drainage catheter was then advanced over the wire. Approximately 15 ml of frankly purulent material was successfully aspirated.  A final axial CT scan demonstrated excellent placement of the catheter within the abscess collection and near total resolution of the collection.  The catheter was secured to the skin with O Prolene suture and connected to bulb suction.  There is no immediate complication, the patient tolerated the procedure well.  IMPRESSION:  CT guided drain placement within the right peri-sigmoid abscess collection.  Aspirated fluid was sent for culture.  Given the nature the abscess and the close relationship with the sigmoid colon, recommend repeat CT scan and tube injection under fluoroscopy prior to tube removal to ensure that there is no  colonic fistula.  Signed,  Sterling Big, MD Vascular & Interventional Radiologist [facility]iology   Original Report Authenticated By: Malachy Moan, M.D.     Anti-infectives: Anti-infectives   Start     Dose/Rate Route Frequency Ordered Stop   04/01/13 1800  metroNIDAZOLE (FLAGYL) IVPB 500 mg     500  mg 100 mL/hr over 60 Minutes Intravenous Every 8 hours 04/01/13 1737     03/30/13 2230  ertapenem (INVANZ) 1 g in sodium chloride 0.9 % 50 mL IVPB     1 g 100 mL/hr over 30 Minutes Intravenous Daily at bedtime 03/30/13 2156     03/30/13 1945  metroNIDAZOLE (FLAGYL) IVPB 500 mg     500 mg 100 mL/hr over 60 Minutes Intravenous  Once 03/30/13 1940 03/30/13 2125   03/30/13 1945  ciprofloxacin (CIPRO) IVPB 400 mg     400 mg 200 mL/hr over 60 Minutes Intravenous  Once 03/30/13 1940 03/30/13 2024      Assessment/Plan: s/p right pelvic abscess drainage 4/19; cont current tx/drain flushes; check final cx's; will eventually need drain inj to r/o fistula before removal; OOB  LOS: 4 days    Minah Axelrod,D [facility] 04/03/2013

## 2013-04-03 NOTE — Progress Notes (Signed)
Patient ID: Alec Kelly, male   DOB: 04/29/1966, 47 y.o.   MRN: [id number]    Subjective: Feels much better today. Significant reduction in his pain. No bowel movement yet.  Objective: Vital signs in last 24 hours: Temp:  [98.3 F (36.8 C)-98.9 F (37.2 C)] 98.9 F (37.2 C) (04/20 0530) Pulse Rate:  [90-110] 97 (04/20 0530) Resp:  [16-28] 20 (04/20 0530) BP: (112-143)/(74-89) 117/74 mmHg (04/20 0530) SpO2:  [94 %-98 %] 94 % (04/20 0530) Last BM Date: 04/02/13  Intake/Output from previous day: 04/19 0701 - 04/20 0700 In: 3397.5 [I.V.:3037.5; IV Piggyback:350] Out: 1210 [Urine:1200; Drains:10] Intake/Output this shift:    General appearance: alert, cooperative, no distress and moderately obese GI: now very mild tenderness in the left lower quadrant without guarding, significantly improved from yesterday. Right gluteal drain in place with minimal purulent drainage  Lab Results:   Recent Labs  04/02/13 0439 04/03/13 0446  WBC 11.0* 9.9  HGB 13.1 12.8*  HCT 38.3* 38.0*  PLT 312 292   BMET  Recent Labs  04/01/13 0421 04/03/13 0446  NA 137 136  K 4.0 3.7  CL 101 101  CO2 28 29  GLUCOSE 100* 102*  BUN 17 16  CREATININE 1.08 0.93  CALCIUM 8.8 8.2*     Studies/Results: Ct Guided Abscess Drain  04/02/2013  *RADIOLOGY REPORT*  CT GUIDED ABSCESS DRAIN  Date: 04/02/2013  Clinical History: 47 year old male with perforated diverticulitis and deep pelvic abscesses.  CT guided drain placement is requested.  Procedures Performed: 1. CT guided placement of a 12-French drain  Interventional Radiologist:  Sterling Big, MD  Sedation: Moderate (conscious) sedation was used.  2.5 mg Versed, 150 mcg Fentanyl were administered intravenously.  The patient's vital signs were monitored continuously by radiology nursing throughout the procedure.  Sedation Time: 28 minutes  PROCEDURE/FINDINGS:   Informed consent was obtained from the patient following explanation of the procedure,  risks, benefits and alternatives. The patient understands, agrees and consents for the procedure. All questions were addressed. A time out was performed.  Maximal barrier sterile technique utilized including caps, mask, sterile gowns, sterile gloves, large sterile drape, hand hygiene, and betadine skin prep.  A planning axial CT scan was performed.  The fluid collection just to the right of the rectosigmoid junction is successfully identified.  There is a very narrow window.  Local anesthesia was achieved by infiltration of 1% lidocaine.  A 20 cm 18 gauge trocar needle was carefully advanced via a right transgluteal approach through the sacrospinous ligament and into the peri-sigmoid fluid collection.  And no 35 wire was advanced and coiled within the fluid collection.  Positioning of wire was confirmed with axial CT imaging.  The skin tract was then serially dilated to 12-French.  A 12 [place] all-purpose drainage catheter was then advanced over the wire. Approximately 15 ml of frankly purulent material was successfully aspirated.  A final axial CT scan demonstrated excellent placement of the catheter within the abscess collection and near total resolution of the collection.  The catheter was secured to the skin with O Prolene suture and connected to bulb suction.  There is no immediate complication, the patient tolerated the procedure well.  IMPRESSION:  CT guided drain placement within the right peri-sigmoid abscess collection.  Aspirated fluid was sent for culture.  Given the nature the abscess and the close relationship with the sigmoid colon, recommend repeat CT scan and tube injection under fluoroscopy prior to tube removal to ensure that  there is no colonic fistula.  Signed,  Sterling Big, MD Vascular & Interventional Radiologist [facility]   Original Report Authenticated By: Malachy Moan, M.D.    Ct Abdomen Pelvis W Contrast  04/01/2013  *RADIOLOGY REPORT*  Clinical Data:  Perforated diverticulitis with abscess.  CT ABDOMEN AND PELVIS WITH CONTRAST  Technique:  Multidetector CT imaging of the abdomen and pelvis was performed following the standard protocol during bolus administration of intravenous contrast.  Contrast: OMNIPAQUE IOHEXOL 300 MG/ML  SOLN  Comparison: 03/30/2013  Findings: Subsegmental atelectasis is noted in the dependent lung bases.  No focal abnormality seen in the liver or spleen.  There is circumferential wall thickening in the distal esophagus and the date diverticulum or lymph node adjacent to the distal esophagus seen on image 7.  This is stable since the exam from 09/25/2012  Stomach, duodenum, pancreas, and adrenal glands are unremarkable. High attenuation layering in the gallbladder lumen and is likely related to a vicarious excretion of contrast.  Kidneys are unremarkable.  No abdominal aortic aneurysm.  No free fluid or bulky lymphadenopathy in the abdomen.  The amount of intraperitoneal free air has decreased since the prior study.  Imaging through the pelvis again shows edema/inflammation around the proximal sigmoid colon.  Abscess cavity anterior to the colon fills with contrast on the current exam and measures slightly smaller than previously, at 3.4 x 2.4 cm today compared to 4.0 x 2.9 cm previously.  There is wall thickening in the proximal and mid sigmoid colon, as before.  Mild lymphadenopathy in the central left pelvis is stable.  Focal fluid collection with subtle rim enhancement in the left central pelvis measures 4.7 x 3.2 cm today compared to 4.2 x 3.4 cm previously.  A second fluid collection posterior to the bladder in the lower central pelvis measures 3.4 x 4.3 cm today compared  to 2.7 x 4.3 cm previously.  The terminal ileum is normal. The appendix is not visualized, but there is no edema or inflammation in the region of the cecum.  Left inguinal hernia contains only fat. Bone windows reveal no worrisome lytic or sclerotic osseous  lesions.  IMPRESSION: Slight interval decrease in the volume of intraperitoneal free air with contrast filling of an abscess cavity anterior to the proximal sigmoid colon.  Two discrete fluid collections in the central pelvis persist without substantial change and suggest evolving abscesses.  Circumferential wall thickening in the distal esophagus.  This is not substantially changed since 09/25/2012.  Esophagitis or neoplasm could have this appearance.   Original Report Authenticated By: Kennith Center, M.D.     Anti-infectives: Anti-infectives   Start     Dose/Rate Route Frequency Ordered Stop   04/01/13 1800  metroNIDAZOLE (FLAGYL) IVPB 500 mg     500 mg 100 mL/hr over 60 Minutes Intravenous Every 8 hours 04/01/13 1737     03/30/13 2230  ertapenem (INVANZ) 1 g in sodium chloride 0.9 % 50 mL IVPB     1 g 100 mL/hr over 30 Minutes Intravenous Daily at bedtime 03/30/13 2156     03/30/13 1945  metroNIDAZOLE (FLAGYL) IVPB 500 mg     500 mg 100 mL/hr over 60 Minutes Intravenous  Once 03/30/13 1940 03/30/13 2125   03/30/13 1945  ciprofloxacin (CIPRO) IVPB 400 mg     400 mg 200 mL/hr over 60 Minutes Intravenous  Once 03/30/13 1940 03/30/13 2024      Assessment/Plan: Perforated diverticulitis with multiple abscesses. Status post successful percutaneous drainage  of largest right pelvic abscess. Much improved today with significantly decreased pain and tenderness, normalization of white count. Continue current treatment, IV antibiotics. Clear liquid diet    LOS: 4 days    Korver Graybeal T 04/03/2013

## 2013-04-04 DIAGNOSIS — M7989 Other specified soft tissue disorders: Secondary | ICD-10-CM

## 2013-04-04 DIAGNOSIS — M79609 Pain in unspecified limb: Secondary | ICD-10-CM

## 2013-04-04 MED ORDER — COLCHICINE 0.6 MG PO TABS
0.6000 mg | ORAL_TABLET | Freq: Two times a day (BID) | ORAL | Status: DC
  Administered 2013-04-04 – 2013-04-08 (×9): 0.6 mg via ORAL
  Filled 2013-04-04 (×10): qty 1

## 2013-04-04 MED ORDER — FUROSEMIDE 20 MG PO TABS
20.0000 mg | ORAL_TABLET | Freq: Every day | ORAL | Status: DC
  Administered 2013-04-04 – 2013-04-08 (×5): 20 mg via ORAL
  Filled 2013-04-04 (×5): qty 1

## 2013-04-04 NOTE — Progress Notes (Signed)
Subjective: Belly feels great but legs swollen and knee hurts on left.  Has gout he says.  Denies N/V or abdominal pain.  Objective: Vital signs in last 24 hours: Temp:  [98.1 F (36.7 C)-102.5 F (39.2 C)] 98.1 F (36.7 C) (04/21 0624) Pulse Rate:  [52-95] 81 (04/21 0624) Resp:  [18-20] 20 (04/21 0624) BP: (127-135)/(82-87) 130/87 mmHg (04/21 0624) SpO2:  [90 %-96 %] 94 % (04/21 0624) Last BM Date: 04/02/13  Intake/Output from previous day: 04/20 0701 - 04/21 0700 In: 2567.9 [P.O.:660; I.V.:1892.9] Out: 1957 [Urine:1950; Drains:7] Intake/Output this shift:    GI: soft, non-tender; bowel sounds normal; no masses,  no organomegaly Extremities: edema bilateral lower extremities  with left knee swelling and pain.  Lab Results:   Recent Labs  04/02/13 0439 04/03/13 0446  WBC 11.0* 9.9  HGB 13.1 12.8*  HCT 38.3* 38.0*  PLT 312 292   BMET  Recent Labs  04/03/13 0446  NA 136  K 3.7  CL 101  CO2 29  GLUCOSE 102*  BUN 16  CREATININE 0.93  CALCIUM 8.2*   PT/INR  Recent Labs  04/02/13 1210  LABPROT 14.6  INR 1.16   ABG No results found for this basename: PHART, PCO2, PO2, HCO3,  in the last 72 hours  Studies/Results: Ct Guided Abscess Drain  04/02/2013  *RADIOLOGY REPORT*  CT GUIDED ABSCESS DRAIN  Date: 04/02/2013  Clinical History: 47 year old male with perforated diverticulitis and deep pelvic abscesses.  CT guided drain placement is requested.  Procedures Performed: 1. CT guided placement of a 12-French drain  Interventional Radiologist:  Sterling Big, MD  Sedation: Moderate (conscious) sedation was used.  2.5 mg Versed, 150 mcg Fentanyl were administered intravenously.  The patient's vital signs were monitored continuously by radiology nursing throughout the procedure.  Sedation Time: 28 minutes  PROCEDURE/FINDINGS:   Informed consent was obtained from the patient following explanation of the procedure, risks, benefits and alternatives. The patient  understands, agrees and consents for the procedure. All questions were addressed. A time out was performed.  Maximal barrier sterile technique utilized including caps, mask, sterile gowns, sterile gloves, large sterile drape, hand hygiene, and betadine skin prep.  A planning axial CT scan was performed.  The fluid collection just to the right of the rectosigmoid junction is successfully identified.  There is a very narrow window.  Local anesthesia was achieved by infiltration of 1% lidocaine.  A 20 cm 18 gauge trocar needle was carefully advanced via a right transgluteal approach through the sacrospinous ligament and into the peri-sigmoid fluid collection.  And no 35 wire was advanced and coiled within the fluid collection.  Positioning of wire was confirmed with axial CT imaging.  The skin tract was then serially dilated to 12-French.  A 12 [place] Cook all-purpose drainage catheter was then advanced over the wire. Approximately 15 ml of frankly purulent material was successfully aspirated.  A final axial CT scan demonstrated excellent placement of the catheter within the abscess collection and near total resolution of the collection.  The catheter was secured to the skin with O Prolene suture and connected to bulb suction.  There is no immediate complication, the patient tolerated the procedure well.  IMPRESSION:  CT guided drain placement within the right peri-sigmoid abscess collection.  Aspirated fluid was sent for culture.  Given the nature the abscess and the close relationship with the sigmoid colon, recommend repeat CT scan and tube injection under fluoroscopy prior to tube removal to ensure that there  is no colonic fistula.  Signed,  Sterling Big, MD Vascular & Interventional Radiologist [facility]   Original Report Authenticated By: Malachy Moan, M.D.     Anti-infectives: Anti-infectives   Start     Dose/Rate Route Frequency Ordered Stop   04/01/13 1800  metroNIDAZOLE (FLAGYL)  IVPB 500 mg     500 mg 100 mL/hr over 60 Minutes Intravenous Every 8 hours 04/01/13 1737     03/30/13 2230  ertapenem (INVANZ) 1 g in sodium chloride 0.9 % 50 mL IVPB     1 g 100 mL/hr over 30 Minutes Intravenous Daily at bedtime 03/30/13 2156     03/30/13 1945  metroNIDAZOLE (FLAGYL) IVPB 500 mg     500 mg 100 mL/hr over 60 Minutes Intravenous  Once 03/30/13 1940 03/30/13 2125   03/30/13 1945  ciprofloxacin (CIPRO) IVPB 400 mg     400 mg 200 mL/hr over 60 Minutes Intravenous  Once 03/30/13 1940 03/30/13 2024      Assessment/Plan:    LOS: 5 days  Diverticular abscess   Improved on ABX and with drainage Gout  Start colchicine today Leg swelling  Cut back IVF,  Lasix and duplex May need PT/OT  Cant walk due to left knee pain and leg swelling  Alec Kelly A. 04/04/2013

## 2013-04-04 NOTE — Progress Notes (Signed)
Bilateral lower extremity venous duplex:  No evidence of DVT, superficial thrombosis, or Baker's Cyst.   

## 2013-04-04 NOTE — Progress Notes (Signed)
Subjective: Pt doing ok; states abd pain improved, left knee painful secondary to gout flare (venous dopplers neg)  Objective: Vital signs in last 24 hours: Temp:  [98.1 F (36.7 C)-102.5 F (39.2 C)] 98.1 F (36.7 C) (04/21 0624) Pulse Rate:  [52-95] 81 (04/21 0624) Resp:  [18-20] 20 (04/21 0624) BP: (127-135)/(82-87) 130/87 mmHg (04/21 0624) SpO2:  [90 %-96 %] 94 % (04/21 0624) Last BM Date: 04/02/13  Intake/Output from previous day: 04/20 0701 - 04/21 0700 In: 2567.9 [P.O.:660; I.V.:1892.9] Out: 1957 [Urine:1950; Drains:7] Intake/Output this shift:    Rt TG drain intact, output mimimal amt serous fluid, cx's pending (few gm neg rods), drain flushed with 10 cc's sterile NS with return of same amt  Lab Results:   Recent Labs  04/02/13 0439 04/03/13 0446  WBC 11.0* 9.9  HGB 13.1 12.8*  HCT 38.3* 38.0*  PLT 312 292   BMET  Recent Labs  04/03/13 0446  NA 136  K 3.7  CL 101  CO2 29  GLUCOSE 102*  BUN 16  CREATININE 0.93  CALCIUM 8.2*   PT/INR  Recent Labs  04/02/13 1210  LABPROT 14.6  INR 1.16   ABG No results found for this basename: PHART, PCO2, PO2, HCO3,  in the last 72 hours  Studies/Results: Ct Guided Abscess Drain  04/02/2013  *RADIOLOGY REPORT*  CT GUIDED ABSCESS DRAIN  Date: 04/02/2013  Clinical History: 47 year old male with perforated diverticulitis and deep pelvic abscesses.  CT guided drain placement is requested.  Procedures Performed: 1. CT guided placement of a 12-French drain  Interventional Radiologist:  Sterling Big, MD  Sedation: Moderate (conscious) sedation was used.  2.5 mg Versed, 150 mcg Fentanyl were administered intravenously.  The patient's vital signs were monitored continuously by radiology nursing throughout the procedure.  Sedation Time: 28 minutes  PROCEDURE/FINDINGS:   Informed consent was obtained from the patient following explanation of the procedure, risks, benefits and alternatives. The patient understands,  agrees and consents for the procedure. All questions were addressed. A time out was performed.  Maximal barrier sterile technique utilized including caps, mask, sterile gowns, sterile gloves, large sterile drape, hand hygiene, and betadine skin prep.  A planning axial CT scan was performed.  The fluid collection just to the right of the rectosigmoid junction is successfully identified.  There is a very narrow window.  Local anesthesia was achieved by infiltration of 1% lidocaine.  A 20 cm 18 gauge trocar needle was carefully advanced via a right transgluteal approach through the sacrospinous ligament and into the peri-sigmoid fluid collection.  And no 35 wire was advanced and coiled within the fluid collection.  Positioning of wire was confirmed with axial CT imaging.  The skin tract was then serially dilated to 12-French.  A 12 [place] all-purpose drainage catheter was then advanced over the wire. Approximately 15 ml of frankly purulent material was successfully aspirated.  A final axial CT scan demonstrated excellent placement of the catheter within the abscess collection and near total resolution of the collection.  The catheter was secured to the skin with O Prolene suture and connected to bulb suction.  There is no immediate complication, the patient tolerated the procedure well.  IMPRESSION:  CT guided drain placement within the right peri-sigmoid abscess collection.  Aspirated fluid was sent for culture.  Given the nature the abscess and the close relationship with the sigmoid colon, recommend repeat CT scan and tube injection under fluoroscopy prior to tube removal to ensure that there is  no colonic fistula.  Signed,  Sterling Big, MD Vascular & Interventional Radiologist [facility]   Original Report Authenticated By: Malachy Moan, M.D.    Recent Results (from the past 240 hour(s))  CULTURE, ROUTINE-ABSCESS     Status: None   Collection Time    04/02/13  4:13 PM      Result  Value Range Status   Specimen Description PERITONEAL CAVITY   Final   Special Requests Normal   Final   Gram Stain     Final   Value: ABUNDANT WBC PRESENT,BOTH PMN AND MONONUCLEAR     NO ORGANISMS SEEN   Culture RARE GRAM NEGATIVE RODS   Final   Report Status PENDING   Incomplete    Anti-infectives: Anti-infectives   Start     Dose/Rate Route Frequency Ordered Stop   04/01/13 1800  metroNIDAZOLE (FLAGYL) IVPB 500 mg     500 mg 100 mL/hr over 60 Minutes Intravenous Every 8 hours 04/01/13 1737     03/30/13 2230  ertapenem (INVANZ) 1 g in sodium chloride 0.9 % 50 mL IVPB     1 g 100 mL/hr over 30 Minutes Intravenous Daily at bedtime 03/30/13 2156     03/30/13 1945  metroNIDAZOLE (FLAGYL) IVPB 500 mg     500 mg 100 mL/hr over 60 Minutes Intravenous  Once 03/30/13 1940 03/30/13 2125   03/30/13 1945  ciprofloxacin (CIPRO) IVPB 400 mg     400 mg 200 mL/hr over 60 Minutes Intravenous  Once 03/30/13 1940 03/30/13 2024      Assessment/Plan: s/p right pelvic abscess drainage 4/19; check final cx's/sens; monitor labs; check f/u CT later this week with drain inj  LOS: 5 days    Lorraina Spring,D [facility] 04/04/2013

## 2013-04-05 ENCOUNTER — Inpatient Hospital Stay (HOSPITAL_COMMUNITY): Payer: MEDICAID

## 2013-04-05 LAB — COMPREHENSIVE METABOLIC PANEL
ALT: 14 U/L (ref 0–53)
Alkaline Phosphatase: 40 U/L (ref 39–117)
BUN: 12 mg/dL (ref 6–23)
CO2: 28 mEq/L (ref 19–32)
Calcium: 8.1 mg/dL — ABNORMAL LOW (ref 8.4–10.5)
GFR calc Af Amer: >90 mL/min (ref >90)
GFR calc non Af Amer: >90 mL/min (ref >90)
Glucose, Bld: 112 mg/dL — ABNORMAL HIGH (ref 70–99)
Sodium: 135 mEq/L (ref 135–145)

## 2013-04-05 LAB — SYNOVIAL CELL COUNT + DIFF, W/ CRYSTALS
Lymphocytes-Synovial Fld: 1 % (ref 0–20)
Lymphocytes-Synovial Fld: 2 % (ref 0–20)
Neutrophil, Synovial: 99 % — ABNORMAL HIGH (ref 0–25)
WBC, Synovial: 39000 /mm3 — ABNORMAL HIGH (ref 0–200)

## 2013-04-05 LAB — CBC
HCT: 36.1 % — ABNORMAL LOW (ref 39.0–52.0)
Hemoglobin: 12.7 g/dL — ABNORMAL LOW (ref 13.0–17.0)
MCH: 30.7 pg (ref 26.0–34.0)
RBC: 4.14 MIL/uL — ABNORMAL LOW (ref 4.22–5.81)

## 2013-04-05 LAB — CULTURE, ROUTINE-ABSCESS

## 2013-04-05 LAB — URIC ACID: Uric Acid, Serum: 5.5 mg/dL (ref 4.0–7.8)

## 2013-04-05 MED ORDER — INDOMETHACIN ER 75 MG PO CPCR
75.0000 mg | ORAL_CAPSULE | Freq: Every day | ORAL | Status: DC
  Administered 2013-04-05 – 2013-04-08 (×4): 75 mg via ORAL
  Filled 2013-04-05 (×5): qty 1

## 2013-04-05 MED ORDER — ALLOPURINOL 100 MG PO TABS
100.0000 mg | ORAL_TABLET | Freq: Every day | ORAL | Status: DC
  Administered 2013-04-05 – 2013-04-06 (×2): 100 mg via ORAL
  Filled 2013-04-05 (×3): qty 1

## 2013-04-05 NOTE — Progress Notes (Signed)
  Subjective: Pt feeling lousy, primary c/o is left knee pain from gout and LE edema. He was about to get on commode but denies any pain/issues from drain  Objective: Vital signs in last 24 hours: Temp:  [99.1 F (37.3 C)-100.6 F (38.1 C)] 99.7 F (37.6 C) (04/22 0557) Pulse Rate:  [80-87] 87 (04/22 0557) Resp:  [18-20] 20 (04/22 0557) BP: (111-120)/(79-83) 115/80 mmHg (04/22 0557) SpO2:  [95 %-96 %] 95 % (04/22 0557) Last BM Date: 04/04/13  Intake/Output from previous day: 04/21 0701 - 04/22 0700 In: 1489.5 [P.O.:730; I.V.:349.5; IV Piggyback:400] Out: 2760 [Urine:2700; Drains:60] Intake/Output this shift: Total I/O In: 400 [I.V.:400] Out: 450 [Urine:450]  (R) TG drain intact, dressing dry. Output serous, 60cc recorded yesterday  Lab Results:   Recent Labs  04/03/13 0446 04/05/13 0415  WBC 9.9 11.8*  HGB 12.8* 12.7*  HCT 38.0* 36.1*  PLT 292 380   BMET  Recent Labs  04/03/13 0446 04/05/13 0415  NA 136 135  K 3.7 3.3*  CL 101 98  CO2 29 28  GLUCOSE 102* 112*  BUN 16 12  CREATININE 0.93 0.85  CALCIUM 8.2* 8.1*   PT/INR  Recent Labs  04/02/13 1210  LABPROT 14.6  INR 1.16   ABG No results found for this basename: PHART, PCO2, PO2, HCO3,  in the last 72 hours  Studies/Results: No results found. Recent Results (from the past 240 hour(s))  CULTURE, ROUTINE-ABSCESS     Status: None   Collection Time    04/02/13  4:13 PM      Result Value Range Status   Specimen Description PERITONEAL CAVITY   Final   Special Requests Normal   Final   Gram Stain     Final   Value: ABUNDANT WBC PRESENT,BOTH PMN AND MONONUCLEAR     NO ORGANISMS SEEN   Culture RARE ESCHERICHIA COLI   Final   Report Status 04/05/2013 FINAL   Final   Organism ID, Bacteria ESCHERICHIA COLI   Final    Anti-infectives: Anti-infectives   Start     Dose/Rate Route Frequency Ordered Stop   04/01/13 1800  metroNIDAZOLE (FLAGYL) IVPB 500 mg  Status:  Discontinued     500 mg 100  mL/hr over 60 Minutes Intravenous Every 8 hours 04/01/13 1737 04/05/13 0715   03/30/13 2230  ertapenem (INVANZ) 1 g in sodium chloride 0.9 % 50 mL IVPB     1 g 100 mL/hr over 30 Minutes Intravenous Daily at bedtime 03/30/13 2156     03/30/13 1945  metroNIDAZOLE (FLAGYL) IVPB 500 mg     500 mg 100 mL/hr over 60 Minutes Intravenous  Once 03/30/13 1940 03/30/13 2125   03/30/13 1945  ciprofloxacin (CIPRO) IVPB 400 mg     400 mg 200 mL/hr over 60 Minutes Intravenous  Once 03/30/13 1940 03/30/13 2024      Assessment/Plan: s/p right pelvic abscess drainage 4/19;  f/u CT later this week with drain inj per [facility]  LOS: 6 days    Brayton El 04/05/2013

## 2013-04-05 NOTE — Progress Notes (Signed)
Feels well.  Need to repeat CT later this week.

## 2013-04-05 NOTE — Consult Note (Signed)
Reason for Consult:bilateral knee pain and swelling Referring Physician: general surgery  Alec Kelly is an 47 y.o. male.  HPI: the patient is a 47 year old male with a history of known gout who was admitted to the hospital for diverticular disease.  We are consult for management of his significant bilateral knee pain.  He states the left knee is much more painful than the right knee.  He states that he has a several day history of increasing swelling and pain.  He denies numbness tingling or radiating pain down the legs.  He has noticed some swelling in both legs over the last several days as well.  Past Medical History  Diagnosis Date  . Kidney stones   . Diverticulitis     Past Surgical History  Procedure Laterality Date  . Tonsillectomy    . Appendectomy    . Hernia repair    . Lithotripsy      History reviewed. No pertinent family history.  Social History:  reports that he has never smoked. He does not have any smokeless tobacco history on file. He reports that  drinks alcohol. He reports that he does not use illicit drugs.  Allergies: No Known Allergies  Medications: I have reviewed the patient's current medications.  Results for orders placed during the hospital encounter of 03/30/13 (from the past 48 hour(s))  CBC     Status: Abnormal   Collection Time    04/05/13  4:15 AM      Result Value Range   WBC 11.8 (*) 4.0 - 10.5 K/uL   RBC 4.14 (*) 4.22 - 5.81 MIL/uL   Hemoglobin 12.7 (*) 13.0 - 17.0 g/dL   HCT 16.1 (*) 09.6 - 04.5 %   MCV 87.2  78.0 - 100.0 fL   MCH 30.7  26.0 - 34.0 pg   MCHC 35.2  30.0 - 36.0 g/dL   RDW 40.9  81.1 - 91.4 %   Platelets 380  150 - 400 K/uL   Comment: SPECIMEN CHECKED FOR CLOTS     DELTA CHECK NOTED  COMPREHENSIVE METABOLIC PANEL     Status: Abnormal   Collection Time    04/05/13  4:15 AM      Result Value Range   Sodium 135  135 - 145 mEq/L   Potassium 3.3 (*) 3.5 - 5.1 mEq/L   Chloride 98  96 - 112 mEq/L   CO2 28  19 - 32  mEq/L   Glucose, Bld 112 (*) 70 - 99 mg/dL   BUN 12  6 - 23 mg/dL   Creatinine, Ser 7.82  0.50 - 1.35 mg/dL   Calcium 8.1 (*) 8.4 - 10.5 mg/dL   Total Protein 6.2  6.0 - 8.3 g/dL   Albumin 2.2 (*) 3.5 - 5.2 g/dL   AST 26  0 - 37 U/L   ALT 14  0 - 53 U/L   Alkaline Phosphatase 40  39 - 117 U/L   Total Bilirubin 0.3  0.3 - 1.2 mg/dL   GFR calc non Af Amer >90  >90 mL/min   GFR calc Af Amer >90  >90 mL/min   Comment:            The eGFR has been calculated     using the CKD EPI equation.     This calculation has not been     validated in all clinical     situations.     eGFR's persistently     <90 mL/min signify  possible Chronic Kidney Disease.  URIC ACID     Status: None   Collection Time    04/05/13  4:15 AM      Result Value Range   Uric Acid, Serum 5.5  4.0 - 7.8 mg/dL    Dg Knee 4 Views W/patella Left  04/05/2013  *RADIOLOGY REPORT*  Clinical Data: Diffuse bilateral knee pain, left greater than right.  LEFT KNEE - COMPLETE 4+ VIEW  Comparison: None.  Findings: The osseous structures appear normal except for minimal osteophytes on the lateral aspect of the patella.  However, the patient does have a prominent effusion.  There is also diffuse subcutaneous edema around the knee.  IMPRESSION: Prominent knee joint effusion.  No significant osseous abnormality.   Original Report Authenticated By: Francene Boyers, M.D.    Dg Knee 4 Views W/patella Right  04/05/2013  *RADIOLOGY REPORT*  Clinical Data: Knee pain.  RIGHT KNEE - COMPLETE 4+ VIEW  Comparison: X-rays dated 07/03/2012  Findings: Minimal lateral osteophytes on the patella. No other osseous abnormality.  Large joint effusion. Diffuse subcutaneous edema.  IMPRESSION: Large joint effusion.  Subcutaneous edema.   Original Report Authenticated By: Francene Boyers, M.D.     ROS: I have reviewed the patient's review of systems thoroughly and there are no positive responses as relates to the HPI. Exam:  Blood pressure 110/77, pulse  87, temperature 100.9 F (38.3 C), temperature source Oral, resp. rate 18, height 5\' 7"  (1.702 m), weight 120.203 kg (265 lb), SpO2 97.00%. Well-developed well-nourished patient in no acute distress. Alert and oriented x3 HEENT:within normal limits Cardiac: Regular rate and rhythm Pulmonary: Lungs clear to auscultation Abdomen: Soft and nontender.  Normal active bowel sounds  Musculoskeletal: (left knee dramatic effusion with painful range of motion.  There is no instability.  There is moderate warmth.  There is no erythema.  Left leg has 1+ pitting edema with global swelling and no calf pain.  There is 2+ distal pulses right knee is swollen but certainly not to the extent of the left knee.  There is mild pain with range of motion.  There is mild erythema and no warmth.  The knee is stable.  There is a 1+ effusion.  Assessment/Plan: 47 year old male admitted with diverticular disease who has relatively sudden onset of severe bilateral knee swelling and pain.  He has a known history of gout but a normal uric acid currently.  We are consult in for management of his bilateral knee swelling.//Our plan will be to aspirate both knees and send this to the lab for Gram stain and cell counts, culture, and crystals.once we have an analysis of the fluid we will likely proceed with cortisone injections into both knees.  We will proceed with aspiration once the supplies come to the floor.  Aliece Honold L 04/05/2013, 11:06 PM

## 2013-04-05 NOTE — Progress Notes (Signed)
Spoke to [employer], [place] informed him patient has temp 100.2, but appears to run low-grade temps, off and on

## 2013-04-05 NOTE — Progress Notes (Signed)
  Subjective: Wants to know when he can go home and wants purple pill for gout.  He's been off it for some time.  Gout is better today. Drainage is clear.  Objective: Vital signs in last 24 hours: Temp:  [99.1 F (37.3 C)-100.6 F (38.1 C)] 99.7 F (37.6 C) (04/22 0557) Pulse Rate:  [80-87] 87 (04/22 0557) Resp:  [18-20] 20 (04/22 0557) BP: (111-120)/(79-83) 115/80 mmHg (04/22 0557) SpO2:  [95 %-96 %] 95 % (04/22 0557) Last BM Date: 04/04/13 730 PO, 60 ml from the drain. Diet: clears started 04/03/13.   WBC is up some, K+ is down some TM 100.6, VSS Intake/Output from previous day: 04/21 0701 - 04/22 0700 In: 1489.5 [P.O.:730; I.V.:349.5; IV Piggyback:400] Out: 2760 [Urine:2700; Drains:60] Intake/Output this shift:    General appearance: alert, cooperative and no distress Resp: clear to auscultation bilaterally GI: soft, still sore hurts to get up still.  some of it may be his gout.  drainage is clear.  Lab Results:   Recent Labs  04/03/13 0446 04/05/13 0415  WBC 9.9 11.8*  HGB 12.8* 12.7*  HCT 38.0* 36.1*  PLT 292 380    BMET  Recent Labs  04/03/13 0446 04/05/13 0415  NA 136 135  K 3.7 3.3*  CL 101 98  CO2 29 28  GLUCOSE 102* 112*  BUN 16 12  CREATININE 0.93 0.85  CALCIUM 8.2* 8.1*   PT/INR  Recent Labs  04/02/13 1210  LABPROT 14.6  INR 1.16     Recent Labs Lab 03/30/13 1906 04/05/13 0415  AST 53* 26  ALT 16 14  ALKPHOS 73 40  BILITOT 0.7 0.3  PROT 8.2 6.2  ALBUMIN 3.2* 2.2*     Lipase     Component Value Date/Time   LIPASE 22 03/30/2013 1906     Studies/Results: No results found.  Medications: . colchicine  0.6 mg Oral BID  . enoxaparin (LOVENOX) injection  40 mg Subcutaneous QHS  . ertapenem [facility]) IV  1 g Intravenous QHS  . furosemide  20 mg Oral Daily    Assessment/Plan Diverticular perforation with multiple abscesses  Percutaneous drain placement 04/02/13, Day 6 Invanz, day 4 of Flagyl Hx of nephrolithiasis     Plan:  Low grade fever, WBC is up some, continue clears, he is on colchicine, I will keep him on some indocin and start some allopurinol.  Aim to repeat CT on Thursday 4/24 with drain injection.  LOS: 6 days    Alex Mcmanigal 04/05/2013

## 2013-04-05 NOTE — Progress Notes (Signed)
Subjective: Bilateral knee swelling.   Objective: Vital signs in last 24 hours: Temp:  [99.7 F (37.6 C)-100.9 F (38.3 C)] 100.9 F (38.3 C) (04/22 2202) Pulse Rate:  [87-99] 87 (04/22 2202) Resp:  [18-20] 18 (04/22 2202) BP: (110-141)/(77-99) 110/77 mmHg (04/22 2202) SpO2:  [95 %-99 %] 97 % (04/22 2202)  Intake/Output from previous day: 04/21 0701 - 04/22 0700 In: 1489.5 [P.O.:730; I.V.:349.5; IV Piggyback:400] Out: 2760 [Urine:2700; Drains:60] Intake/Output this shift: Total I/O In: 480 [P.O.:480] Out: 410 [Urine:400; Drains:10]   Recent Labs  04/03/13 0446 04/05/13 0415  HGB 12.8* 12.7*    Recent Labs  04/03/13 0446 04/05/13 0415  WBC 9.9 11.8*  RBC 4.31 4.14*  HCT 38.0* 36.1*  PLT 292 380    Recent Labs  04/03/13 0446 04/05/13 0415  NA 136 135  K 3.7 3.3*  CL 101 98  CO2 29 28  BUN 16 12  CREATININE 0.93 0.85  GLUCOSE 102* 112*  CALCIUM 8.2* 8.1*   No results found for this basename: LABPT, INR,  in the last 72 hours  Neurologically intact ABD soft Neurovascular intact No cellulitis present Compartment soft  Assessment/Plan: After long discussion of treatment options we ultimately elected to aspirate both knees.  After obtaining verbal and written consent, the patient underwent aspiration of both of his knees under sterile technique on the floor.  We aspirated 140 cc of the left knee and set this to the lab for analysis.  We aspirated 15 cc out of the right knee and send this to the lab for analysis.  The patient was observed for 10 min. After aspiration and sterile dressings and bandages were applied to the knee.  We will await analysis of the fluid for further treatment options.   Znya Albino L 04/05/2013, 11:10 PM

## 2013-04-05 NOTE — Progress Notes (Signed)
Spoke to Dr. Daphine Deutscher made aware of bilateral knee xray results.

## 2013-04-06 ENCOUNTER — Inpatient Hospital Stay (HOSPITAL_COMMUNITY): Payer: MEDICAID

## 2013-04-06 DIAGNOSIS — M10069 Idiopathic gout, unspecified knee: Secondary | ICD-10-CM | POA: Diagnosis not present

## 2013-04-06 MED ORDER — IOHEXOL 300 MG/ML  SOLN
50.0000 mL | Freq: Once | INTRAMUSCULAR | Status: AC | PRN
  Administered 2013-04-06: 50 mL via ORAL

## 2013-04-06 MED ORDER — BUPIVACAINE HCL 0.5 % IJ SOLN
50.0000 mL | INTRAMUSCULAR | Status: AC
  Filled 2013-04-06: qty 50

## 2013-04-06 MED ORDER — POTASSIUM CHLORIDE CRYS ER 20 MEQ PO TBCR
20.0000 meq | EXTENDED_RELEASE_TABLET | Freq: Two times a day (BID) | ORAL | Status: DC
  Administered 2013-04-06 – 2013-04-08 (×5): 20 meq via ORAL
  Filled 2013-04-06 (×6): qty 1

## 2013-04-06 MED ORDER — IOHEXOL 300 MG/ML  SOLN
100.0000 mL | Freq: Once | INTRAMUSCULAR | Status: AC | PRN
  Administered 2013-04-06: 100 mL via INTRAVENOUS

## 2013-04-06 MED ORDER — METHYLPREDNISOLONE ACETATE 40 MG/ML IJ SUSP
160.0000 mg | INTRAMUSCULAR | Status: AC
  Filled 2013-04-06: qty 4

## 2013-04-06 NOTE — Progress Notes (Signed)
Gout flareup. Needs repeat CT thursday

## 2013-04-06 NOTE — Progress Notes (Signed)
Subjective: Feel somewhat better after his knees were tapped yesterday, and they are getting ready to inject some cortisone into each knee.  Findings from taps showed uric acid crystals in each knee.  Objective: Vital signs in last 24 hours: Temp:  [98 F (36.7 C)-100.9 F (38.3 C)] 98 F (36.7 C) (04/23 0559) Pulse Rate:  [87-99] 88 (04/23 0559) Resp:  [18] 18 (04/23 0559) BP: (110-141)/(77-99) 119/82 mmHg (04/23 0559) SpO2:  [96 %-99 %] 96 % (04/23 0559) Last BM Date: 04/05/13 TM 100.9, BP up some yesterday, No labs, knee films show effusion and edema in both knees Diet: clears Intake/Output from previous day: 04/22 0701 - 04/23 0700 In: 1925 [P.O.:960; I.V.:800; IV Piggyback:150] Out: 1880 [Urine:1850; Drains:30] Intake/Output this shift:    General appearance: alert, cooperative and no distress Resp: clear to auscultation bilaterally GI: soft, non-tender; bowel sounds normal; no masses,  no organomegaly Extremities: knees slightly better.  Lab Results:   Recent Labs  04/05/13 0415  WBC 11.8*  HGB 12.7*  HCT 36.1*  PLT 380    BMET  Recent Labs  04/05/13 0415  NA 135  K 3.3*  CL 98  CO2 28  GLUCOSE 112*  BUN 12  CREATININE 0.85  CALCIUM 8.1*   PT/INR No results found for this basename: LABPROT, INR,  in the last 72 hours   Recent Labs Lab 03/30/13 1906 04/05/13 0415  AST 53* 26  ALT 16 14  ALKPHOS 73 40  BILITOT 0.7 0.3  PROT 8.2 6.2  ALBUMIN 3.2* 2.2*     Lipase     Component Value Date/Time   LIPASE 22 03/30/2013 1906     Studies/Results: Dg Knee 4 Views W/patella Left  04/05/2013  *RADIOLOGY REPORT*  Clinical Data: Diffuse bilateral knee pain, left greater than right.  LEFT KNEE - COMPLETE 4+ VIEW  Comparison: None.  Findings: The osseous structures appear normal except for minimal osteophytes on the lateral aspect of the patella.  However, the patient does have a prominent effusion.  There is also diffuse subcutaneous edema around  the knee.  IMPRESSION: Prominent knee joint effusion.  No significant osseous abnormality.   Original Report Authenticated By: Francene Boyers, M.D.    Dg Knee 4 Views W/patella Right  04/05/2013  *RADIOLOGY REPORT*  Clinical Data: Knee pain.  RIGHT KNEE - COMPLETE 4+ VIEW  Comparison: X-rays dated 07/03/2012  Findings: Minimal lateral osteophytes on the patella. No other osseous abnormality.  Large joint effusion. Diffuse subcutaneous edema.  IMPRESSION: Large joint effusion.  Subcutaneous edema.   Original Report Authenticated By: Francene Boyers, M.D.     Medications: . allopurinol  100 mg Oral Daily  . bupivacaine  50 mL Infiltration STAT  . colchicine  0.6 mg Oral BID  . enoxaparin (LOVENOX) injection  40 mg Subcutaneous QHS  . ertapenem [facility]) IV  1 g Intravenous QHS  . furosemide  20 mg Oral Daily  . indomethacin  75 mg Oral Q breakfast  . methylPREDNISolone acetate  160 mg Intra-articular STAT    Assessment/Plan Diverticular perforation with multiple abscesses  Percutaneous drain placement 04/02/13, Day 6 Invanz, day 4 of Flagyl  Hx of nephrolithiasis Bilateral knee effusions left greater than right.(Gout) Hx of gout, with normal uric acid.   Plan:  He is getting his knees injected right now.  I will up him to full liquids and plan on repeat CT and injection of the drain tomorrow.   LOS: 7 days    Chanele Douglas 04/06/2013

## 2013-04-06 NOTE — Progress Notes (Signed)
  Subjective: Pelvic divertic abscess drain placed 4/19 Better daily Up taking bath  Objective: Vital signs in last 24 hours: Temp:  [98 F (36.7 C)-100.9 F (38.3 C)] 98 F (36.7 C) (04/23 0559) Pulse Rate:  [87-99] 88 (04/23 0559) Resp:  [18] 18 (04/23 0559) BP: (110-141)/(77-99) 119/82 mmHg (04/23 0559) SpO2:  [96 %-99 %] 96 % (04/23 0559) Last BM Date: 04/05/13  Intake/Output from previous day: 04/22 0701 - 04/23 0700 In: 1925 [P.O.:960; I.V.:800; IV Piggyback:150] Out: 1880 [Urine:1850; Drains:30] Intake/Output this shift:    PE:  Afeb; Tmax 100.9 this am Site of drain NT; no bleeding Output 30 cc yesterday 20 cc in JP now- serous + Ecoli  Lab Results:   Recent Labs  04/05/13 0415  WBC 11.8*  HGB 12.7*  HCT 36.1*  PLT 380   BMET  Recent Labs  04/05/13 0415  NA 135  K 3.3*  CL 98  CO2 28  GLUCOSE 112*  BUN 12  CREATININE 0.85  CALCIUM 8.1*   PT/INR No results found for this basename: LABPROT, INR,  in the last 72 hours ABG No results found for this basename: PHART, PCO2, PO2, HCO3,  in the last 72 hours  Studies/Results: Dg Knee 4 Views W/patella Left  04/05/2013  *RADIOLOGY REPORT*  Clinical Data: Diffuse bilateral knee pain, left greater than right.  LEFT KNEE - COMPLETE 4+ VIEW  Comparison: None.  Findings: The osseous structures appear normal except for minimal osteophytes on the lateral aspect of the patella.  However, the patient does have a prominent effusion.  There is also diffuse subcutaneous edema around the knee.  IMPRESSION: Prominent knee joint effusion.  No significant osseous abnormality.   Original Report Authenticated By: Francene Boyers, M.D.    Dg Knee 4 Views W/patella Right  04/05/2013  *RADIOLOGY REPORT*  Clinical Data: Knee pain.  RIGHT KNEE - COMPLETE 4+ VIEW  Comparison: X-rays dated 07/03/2012  Findings: Minimal lateral osteophytes on the patella. No other osseous abnormality.  Large joint effusion. Diffuse subcutaneous  edema.  IMPRESSION: Large joint effusion.  Subcutaneous edema.   Original Report Authenticated By: Francene Boyers, M.D.     Anti-infectives:   Assessment/Plan: s/p  Diverticular abscess Drain placed 4/19 Follow Will need Re CT when output 10cc or less/24hrs  LOS: 7 days    Deasiah Hagberg A 04/06/2013

## 2013-04-06 NOTE — Progress Notes (Signed)
Subjective: Both knees are still painful and sore. left greater than right.  "I told you  I thought it was gout!  "  Objective: Vital signs in last 24 hours: Temp:  [98 F (36.7 C)-100.9 F (38.3 C)] 98 F (36.7 C) (04/23 0559) Pulse Rate:  [87-99] 88 (04/23 0559) Resp:  [18] 18 (04/23 0559) BP: (110-141)/(77-99) 119/82 mmHg (04/23 0559) SpO2:  [96 %-99 %] 96 % (04/23 0559) Weight change:  Last BM Date: 04/05/13  Intake/Output from previous day: 04/22 0701 - 04/23 0700 In: 1925 [P.O.:960; I.V.:800; IV Piggyback:150] Out: 1880 [Urine:1850; Drains:30] Intake/Output this shift: Total I/O In: 200 [P.O.:120; I.V.:80] Out: 205 [Urine:200; Drains:5] Synovial fluid analysis positive for sodium monitor urate crystals (gouty arthritis) of both knees. Left knee exam: Has a 1+ effusion.  Diffuse tenderness.  Pain with range of motion.  No warmth.  Calf soft and nontender. Right knee exam: Mild effusion.  Persistent tenderness.  Also has pain with range of motion.  Calf is soft and nontender.   Lab Results:  Recent Labs  04/05/13 0415  WBC 11.8*  HGB 12.7*  HCT 36.1*  PLT 380   BMET  Recent Labs  04/05/13 0415  NA 135  K 3.3*  CL 98  CO2 28  GLUCOSE 112*  BUN 12  CREATININE 0.85  CALCIUM 8.1*    Studies/Results: Dg Knee 4 Views W/patella Left  04/05/2013  *RADIOLOGY REPORT*  Clinical Data: Diffuse bilateral knee pain, left greater than right.  LEFT KNEE - COMPLETE 4+ VIEW  Comparison: None.  Findings: The osseous structures appear normal except for minimal osteophytes on the lateral aspect of the patella.  However, the patient does have a prominent effusion.  There is also diffuse subcutaneous edema around the knee.  IMPRESSION: Prominent knee joint effusion.  No significant osseous abnormality.   Original Report Authenticated By: Francene Boyers, M.D.    Dg Knee 4 Views W/patella Right  04/05/2013  *RADIOLOGY REPORT*  Clinical Data: Knee pain.  RIGHT KNEE - COMPLETE 4+  VIEW  Comparison: X-rays dated 07/03/2012  Findings: Minimal lateral osteophytes on the patella. No other osseous abnormality.  Large joint effusion. Diffuse subcutaneous edema.  IMPRESSION: Large joint effusion.  Subcutaneous edema.   Original Report Authenticated By: Francene Boyers, M.D.     Assessment/Plan: Gouty arthritis bilateral knees.  Bilateral knee effusions left greater than right.  Plan: Procedure 1: Under sterile conditions I injected his left knee from a lateral approach with a 2 cc to 8 cc 40 mg per mL Depo-Medrol to Marcaine mixture.  No complications were encountered. Procedure #2: Under sterile conditions I injected his right knee from a lateral approach with a 2 cc to 8 cc 40 mg per mL of Depo-Medrol to Marcaine mixture.  No complications were encountered. He may mobilize/ambulate as tolerated. Continue on oral colchicine. Should get fairly dramatic relief within a day or so. We will follow him along.  He'll need followup as an outpatient with [place] in about 10 days.  LOS: 7 days   Merry Pond G 04/06/2013, 12:24 PM

## 2013-04-06 NOTE — Progress Notes (Signed)
Telephone order from MD [facility] for 160mg   og depomediol, marcaine with perse .5, orders entered Stanford Breed RN 04-06-2013 7:33am

## 2013-04-06 NOTE — Evaluation (Signed)
Physical Therapy Evaluation Patient Details Name: Alec Kelly MRN: [id number] DOB: [birth year] Today's Date: 04/06/2013 Time: 6213-0865 PT Time Calculation (min): 16 min  PT Assessment / Plan / Recommendation Clinical Impression  Pt is a 47 year old male admitted with diverticular perforation with multiple abscesses s/p perc R drain as well as gouty arthritis bilateral knees.  Pt would benefit from acute PT services in order to improve independence with transfers and ambulation to prepare for d/c home with significant other.    PT Assessment  Patient needs continued PT services    Follow Up Recommendations  Home health PT    Does the patient have the potential to tolerate intense rehabilitation      Barriers to Discharge        Equipment Recommendations  Rolling walker with 5" wheels    Recommendations for Other Services     Frequency Min 3X/week    Precautions / Restrictions Precautions Precautions: Fall   Pertinent Vitals/Pain Reports bil knee pain much improved this afternoon and able to tolerate ambulation     Mobility  Bed Mobility Bed Mobility: Not assessed Transfers Transfers: Stand to Sit;Sit to Stand Sit to Stand: 4: Min assist;With upper extremity assist;From chair/3-in-1 Stand to Sit: 4: Min guard;With upper extremity assist;To chair/3-in-1 Details for Transfer Assistance: min assist for first rise due to pain and to steady however min/guard thereafter, wide BOS initially Ambulation/Gait Ambulation/Gait Assistance: 4: Min guard Ambulation Distance (Feet): 280 Feet Assistive device: Rolling walker Ambulation/Gait Assistance Details: verbal cues for posture Gait Pattern: Trunk flexed;Decreased stance time - left;Step-through pattern;Decreased step length - left;Antalgic    Exercises     PT Diagnosis: Acute pain;Difficulty walking  PT Problem List: Decreased strength;Decreased activity tolerance;Decreased mobility;Decreased knowledge of use of  DME;Pain PT Treatment Interventions: DME instruction;Gait training;Functional mobility training;Therapeutic activities;Therapeutic exercise;Patient/family education   PT Goals Acute Rehab PT Goals PT Goal Formulation: With patient Time For Goal Achievement: 04/20/13 Potential to Achieve Goals: Good Pt will go Supine/Side to Sit: with modified independence PT Goal: Supine/Side to Sit - Progress: Goal set today Pt will go Sit to Stand: with modified independence PT Goal: Sit to Stand - Progress: Goal set today Pt will go Stand to Sit: with modified independence PT Goal: Stand to Sit - Progress: Goal set today Pt will Ambulate: >150 feet;with modified independence;with least restrictive assistive device PT Goal: Ambulate - Progress: Revised due to lack of progress Pt will Go Up / Down Stairs: 3-5 stairs;with supervision;with rail(s) PT Goal: Up/Down Stairs - Progress: Goal set today  Visit Information  Last PT Received On: 04/06/13 Assistance Needed: +1    Subjective Data  Subjective: He took off 4 of those syringes of lime green fluid from this L knee.   Prior Functioning  Home Living Lives With: Significant other Type of Home: House Home Access: Stairs to enter [employer] of Steps: 2-3 Entrance Stairs-Rails: Right Home Layout: One level Home Adaptive Equipment: None Prior Function Level of Independence: Independent Vocation: Full time employment Communication Communication: No difficulties    Cognition  Cognition Arousal/Alertness: Awake/alert Behavior During Therapy: WFL for tasks assessed/performed Overall Cognitive Status: Within Functional Limits for tasks assessed    Extremity/Trunk Assessment Right Upper Extremity Assessment RUE ROM/Strength/Tone: WFL for tasks assessed Left Upper Extremity Assessment LUE ROM/Strength/Tone: WFL for tasks assessed Right Lower Extremity Assessment RLE ROM/Strength/Tone: Deficits;Due to pain RLE ROM/Strength/Tone  Deficits: decreased active knee flexion due to pain however at least 90* for transfers observed Left Lower Extremity  Assessment LLE ROM/Strength/Tone: Deficits;Due to pain LLE ROM/Strength/Tone Deficits: decreased active knee flexion due to pain however at least 90* for transfers observed   Balance    End of Session PT - End of Session Activity Tolerance: Patient tolerated treatment well Patient left: in chair;with call bell/phone within reach  GP     [facility] E 04/06/2013, 3:10 PM Zenovia Jarred, PT, DPT 04/06/2013 Pager: [phone]

## 2013-04-07 ENCOUNTER — Encounter (HOSPITAL_COMMUNITY): Payer: Self-pay | Admitting: General Surgery

## 2013-04-07 DIAGNOSIS — M10262 Drug-induced gout, left knee: Secondary | ICD-10-CM

## 2013-04-07 DIAGNOSIS — K5720 Diverticulitis of large intestine with perforation and abscess without bleeding: Secondary | ICD-10-CM | POA: Diagnosis present

## 2013-04-07 DIAGNOSIS — K5732 Diverticulitis of large intestine without perforation or abscess without bleeding: Secondary | ICD-10-CM

## 2013-04-07 DIAGNOSIS — E66813 Obesity, class 3: Secondary | ICD-10-CM

## 2013-04-07 DIAGNOSIS — K228 Other specified diseases of esophagus: Secondary | ICD-10-CM | POA: Clinically undetermined

## 2013-04-07 HISTORY — DX: Morbid (severe) obesity due to excess calories: E66.01

## 2013-04-07 HISTORY — DX: Drug-induced gout, left knee: M10.262

## 2013-04-07 HISTORY — DX: Diverticulitis of large intestine without perforation or abscess without bleeding: K57.32

## 2013-04-07 HISTORY — DX: Obesity, class 3: E66.813

## 2013-04-07 LAB — BASIC METABOLIC PANEL
Calcium: 8.8 mg/dL (ref 8.4–10.5)
GFR calc non Af Amer: >90 mL/min (ref >90)
Sodium: 136 mEq/L (ref 135–145)

## 2013-04-07 LAB — CBC
MCH: 30.3 pg (ref 26.0–34.0)
MCHC: 35.1 g/dL (ref 30.0–36.0)
Platelets: 531 10*3/uL — ABNORMAL HIGH (ref 150–400)
RBC: 4.42 MIL/uL (ref 4.22–5.81)

## 2013-04-07 MED ORDER — PANTOPRAZOLE SODIUM 40 MG PO TBEC
40.0000 mg | DELAYED_RELEASE_TABLET | Freq: Two times a day (BID) | ORAL | Status: DC
  Administered 2013-04-07 – 2013-04-08 (×3): 40 mg via ORAL
  Filled 2013-04-07 (×5): qty 1

## 2013-04-07 MED ORDER — ALLOPURINOL 100 MG PO TABS
200.0000 mg | ORAL_TABLET | Freq: Every day | ORAL | Status: DC
  Administered 2013-04-07 – 2013-04-08 (×2): 200 mg via ORAL
  Filled 2013-04-07 (×2): qty 2

## 2013-04-07 MED ORDER — AMOXICILLIN-POT CLAVULANATE 875-125 MG PO TABS
1.0000 | ORAL_TABLET | Freq: Two times a day (BID) | ORAL | Status: DC
  Administered 2013-04-07: 1 via ORAL
  Filled 2013-04-07 (×2): qty 1

## 2013-04-07 MED ORDER — METRONIDAZOLE 500 MG PO TABS
500.0000 mg | ORAL_TABLET | Freq: Three times a day (TID) | ORAL | Status: DC
  Administered 2013-04-07 – 2013-04-08 (×4): 500 mg via ORAL
  Filled 2013-04-07 (×6): qty 1

## 2013-04-07 MED ORDER — CIPROFLOXACIN HCL 500 MG PO TABS
500.0000 mg | ORAL_TABLET | Freq: Two times a day (BID) | ORAL | Status: DC
  Administered 2013-04-07 – 2013-04-08 (×2): 500 mg via ORAL
  Filled 2013-04-07 (×4): qty 1

## 2013-04-07 NOTE — Progress Notes (Signed)
Physical Therapy Treatment Patient Details Name: Alec Kelly MRN: [id number] DOB: 19-Jan-1966 Today's Date: 04/07/2013 Time: 4098-1191 PT Time Calculation (min): 26 min  PT Assessment / Plan / Recommendation Comments on Treatment Session  Pt ambulated in hallway without assistive device as he declines need for one at this time however still present with antalgic gait due to pain in L knee.  Pt also performed LE exercises in recliner.  Pt hopes to d/c home tomorrow.    Follow Up Recommendations  Home health PT     Does the patient have the potential to tolerate intense rehabilitation     Barriers to Discharge        Equipment Recommendations  Cane;Other (comment) (however pt declines)    Recommendations for Other Services    Frequency     Plan Discharge plan remains appropriate;Frequency remains appropriate    Precautions / Restrictions Precautions Precautions: Fall   Pertinent Vitals/Pain Reports more pain in L knee than R however tolerable for ambulation and exercises.    Mobility  Bed Mobility Bed Mobility: Not assessed Transfers Transfers: Stand to Sit;Sit to Stand Sit to Stand: With upper extremity assist;From chair/3-in-1;5: Supervision Stand to Sit: With upper extremity assist;To chair/3-in-1;5: Supervision Details for Transfer Assistance: pt feeling much better today Ambulation/Gait Ambulation/Gait Assistance: 4: Min guard Ambulation Distance (Feet): 400 Feet Assistive device: Rolling walker Ambulation/Gait Assistance Details: declined RW however wanted to bring along in case of fatigue (not needed) Gait Pattern: Decreased stance time - left;Step-through pattern;Decreased step length - left;Antalgic Gait velocity: decreased General Gait Details: declines assistive device even SPC and states if he feels he needs it then he will purchase one    Exercises General Exercises - Lower Extremity Ankle Circles/Pumps: AROM;20 reps;Both Quad Sets: AROM;20  reps;Both Long Arc Quad: AROM;Both;20 reps;Seated Heel Slides: AROM;15 reps;Both Hip ABduction/ADduction: AROM;Both;20 reps Straight Leg Raises: AROM;Both;10 reps Hip Flexion/Marching: AROM;Seated;Both;20 reps   PT Diagnosis:    PT Problem List:   PT Treatment Interventions:     PT Goals Acute Rehab PT Goals PT Goal: Sit to Stand - Progress: Progressing toward goal PT Goal: Stand to Sit - Progress: Progressing toward goal PT Goal: Ambulate - Progress: Progressing toward goal  Visit Information  Last PT Received On: 04/07/13 Assistance Needed: +1    Subjective Data  Subjective: I can go without the walker. 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

## 2013-04-07 NOTE — Progress Notes (Signed)
Subjective: "my knees feel great".  Sitting up in chair.  His knees are feeling much better.  Objective: Vital signs in last 24 hours: Temp:  [97.9 F (36.6 C)-98 F (36.7 C)] 98 F (36.7 C) (04/24 0448) Pulse Rate:  [68-82] 68 (04/24 0448) Resp:  [18] 18 (04/24 0448) BP: (109-126)/(70-88) 126/88 mmHg (04/24 0448) SpO2:  [91 %-96 %] 94 % (04/24 0448) Weight change:  Last BM Date: 04/07/13  Intake/Output from previous day: 04/23 0701 - 04/24 0700 In: 895 [P.O.:360; I.V.:480; IV Piggyback:50] Out: 630 [Urine:600; Drains:30] Intake/Output this shift: Total I/O In: 240 [P.O.:240] Out: -  Bilateral knee exams: No significant effusions.  No tenderness.  Nearly full range of motion without pain.  Lab Results:  Recent Labs  04/05/13 0415 04/07/13 0439  WBC 11.8* 17.2*  HGB 12.7* 13.4  HCT 36.1* 38.2*  PLT 380 531*   BMET  Recent Labs  04/05/13 0415 04/07/13 0439  NA 135 136  K 3.3* 4.3  CL 98 99  CO2 28 29  GLUCOSE 112* 139*  BUN 12 16  CREATININE 0.85 0.80  CALCIUM 8.1* 8.8    Studies/Results: Ct Abdomen Pelvis W Contrast  04/06/2013  *RADIOLOGY REPORT*  Clinical Data: Follow up of diverticular abscess.  History of kidney stones.  CT ABDOMEN AND PELVIS WITH CONTRAST  Technique:  Multidetector CT imaging of the abdomen and pelvis was performed following the standard protocol during bolus administration of intravenous contrast.  Contrast: OMNIPAQUE IOHEXOL 300 MG/ML  SOLN, 50mL OMNIPAQUE IOHEXOL 300 MG/ML  SOLN  Comparison: Drainage procedure of 04/02/2013.  Diagnostic CT of 04/01/2013.  Findings: Lung bases:  Clear lung bases. Normal heart size without pericardial or pleural effusion.  Distal esophageal wall thickening is moderate.  Adjacent para esophageal node is mildly enlarged at 9 mm.  Abdomen/pelvis:  Suspicion of hepatic steatosis, without focal liver lesion.  Normal spleen, stomach, pancreas, gallbladder, biliary tract, adrenal glands.  Punctate left renal  collecting system calculus. No hydronephrosis. Normal right kidney.  Small retroperitoneal nodes, without adenopathy.The extent of the proximal sigmoid colonic wall thickening is decreased.  A pericolonic contrast collection anteriorly measures 3.0 x 2.2 cm versus 3.4 x 2.4 cm on the prior.  There are foci of extraluminal air within the sigmoid mesocolon, including on image 62/series 2.  These are similar to slightly decreased.  A cul-de-sac drain has been placed in the interval.  The right paracentral cul-de-sac fluid collection has resolved.  There is a persistent left pelvic gas and fluid collection which measures 1.6 x 3.6 cm on image 76/series 2.  Decreased from 4.7 x 3.2 cm at the same level on the prior exam.  No new collections are seen.  Normal terminal ileum.  Normal small bowel without abdominal ascites.  Borderline enlarged right external iliac node 1.0 cm is unchanged. There is also borderline adenopathy along the left pelvic sidewall. Prominent nodes in the sigmoid mesocolon are presumably reactive and are unchanged since the prior.  Normal urinary bladder, without air within.  Normal prostate.  Bones/Musculoskeletal:  No acute osseous abnormality.  IMPRESSION:  1.  Interval placement of a cul-de-sac drain.  Resolution of the right paracentral pelvic collection with decreased size of extraluminal gas and fluid collections.  No new or enlarging collection identified. 2.  Improved sigmoid wall thickening. 3.  Mild pelvic adenopathy, likely reactive. 4.  Distal esophageal wall thickening with adjacent prominent nodes.  This could represent esophagitis or neoplastic process. Consider non emergent endoscopy. 5.  Left nephrolithiasis.   Original Report Authenticated By: Jeronimo Greaves, M.D.    Dg Knee 4 Views W/patella Left  04/05/2013  *RADIOLOGY REPORT*  Clinical Data: Diffuse bilateral knee pain, left greater than right.  LEFT KNEE - COMPLETE 4+ VIEW  Comparison: None.  Findings: The osseous structures  appear normal except for minimal osteophytes on the lateral aspect of the patella.  However, the patient does have a prominent effusion.  There is also diffuse subcutaneous edema around the knee.  IMPRESSION: Prominent knee joint effusion.  No significant osseous abnormality.   Original Report Authenticated By: Francene Boyers, M.D.    Dg Knee 4 Views W/patella Right  04/05/2013  *RADIOLOGY REPORT*  Clinical Data: Knee pain.  RIGHT KNEE - COMPLETE 4+ VIEW  Comparison: X-rays dated 07/03/2012  Findings: Minimal lateral osteophytes on the patella. No other osseous abnormality.  Large joint effusion. Diffuse subcutaneous edema.  IMPRESSION: Large joint effusion.  Subcutaneous edema.   Original Report Authenticated By: Francene Boyers, M.D.       Assessment/Plan: Gouty arthritis, bilateral knees.  Improved after steroid injections.  Plan: Activity as tolerated.  We will sign off for now.  He will need a followup with Dr. Luiz Blare in the office in 10 days.   LOS: 8 days   Madlyn Crosby G 04/07/2013, 9:12 AM

## 2013-04-07 NOTE — Progress Notes (Signed)
Looks much better.  Needs outpatient follw up with Dr Megan Salon FOR DRAIN.

## 2013-04-07 NOTE — Discharge Summary (Signed)
Physician Discharge Summary  Patient ID: Alec Kelly MRN: [id number] DOB/AGE: Apr 28, 1966 47 y.o.  Admit date: 03/30/2013 Discharge date: 04/08/2013  Admission Diagnoses: Sigmoid diverticulitis with perfortation Recurrent diverticulitis Hx of nephrolithiasis Hx of gout Body mass index is 41.5   Discharge Diagnoses:  Diverticular perforation with multiple abscesses Improved on CT done last PM  Percutaneous drain placement 04/02/13, Day 7 Invanz, day 4 of Flagyl (d/ced 04/06/13)  Hx of nephrolithiasis  Gouty arthritis bilateral knees. Bilateral knee effusions left greater than right.  Hx of gout, with normal uric acid.  Distal esophageal wall thickening with adjacent prominent nodes. This could represent esophagitis or neoplastic process. Pt was never a smoker.  Body mass index is 41.5   Principal Problem:   Diverticulitis of colon with perforation Active Problems:   Idiopathic gout of knee   Obesity, Class III, BMI 40-49.9 (morbid obesity)   Esophageal thickening   PROCEDURES: Percutaneous right pelvic/diverticular abscess drainage 4/19    Hospital Course: pt [facility] on 4[facility]13 with abd pain. CT showed focal diverticulitis of the proximal sigmoid. He was placed on cipro and flagyl and discharged. He has prior CT's showing moderate diverticulitis going back to 09/2012.He returns on the night  Of admission with worsening pain, nausea and vomiting. CT scan showed interval sigmoid colon perforation, with fluid collections compatible with abscess. He was admitted and place on Invanz.  HCT was repeated again on 4/18 showing decreased volume of the free intraperitoneal air, and this led to IR  Placing a drain in one of the fluid collections on 04/02/13. This has grown out some E coli reisistant to ampicillin.  His symptoms from his abdomen improved with the drain and antibiotics, but he developed knee effusions, knee pain and was unable to walk.  He had a hx of gout, and we  started him  on Colchicine, but it did not improve his sx.  Uric acid was normal and Orthopedics was called.  He had both knees tapped and fluid came back with urate crystals.  He was then injected with cortisone and has had marked significant relief.  Drainage from the perc drain is clear and is averaging about 30 ml over the last 2 day. CT on 4/23 showed resolution of the right paracentral pelvic collection with a decrease in the amount of extraluminal gas. It also showed thickening of the distal esophageus with some adjacent prominent nodes, which could represent esophagitis or a neoplastic process.  If he continues to do well and WBC is trending down, we hope to d/c again on cipro and flagyl. No prior hx of tobacco use.  I have added protonix to his medicine list.  We also placed him on colchicine and allopurinol for his gout.  On day of discharge the patient's WBC was up to 18.7 however he was asymptomatic, denied abd pain, nausea, vomiting, fever or chills.  It was felt that the patient could still go home but that he would need to have his CBC recheck on Monday and he was advised to come back to hospital if he developed pain, fevers, nausea or vomiting, The patient was agreeable to that.  He will do drain care daily with replacing the external dressing, he may shower but change dressing after shower.  He will empty the drain daily and record output.  He knows to call with questions or concerns.   Disposition: 01-Home or Self Care     Medication List    STOP taking these  medications       oxyCODONE-acetaminophen 5-325 MG per tablet  Commonly known as:  PERCOCET/ROXICET      TAKE these medications       allopurinol 100 MG tablet  Commonly known as:  ZYLOPRIM  Take 2 tablets (200 mg total) by mouth daily.     ciprofloxacin 500 MG tablet  Commonly known as:  CIPRO  Take 1 tablet (500 mg total) by mouth 2 (two) times daily.     colchicine 0.6 MG tablet  Take 1 tablet (0.6 mg total) by  mouth 2 (two) times daily.     ibuprofen 200 MG tablet  Commonly known as:  ADVIL,MOTRIN  Take 800 mg by mouth every 8 (eight) hours as needed for pain.     metroNIDAZOLE 500 MG tablet  Commonly known as:  FLAGYL  Take 1 tablet (500 mg total) by mouth every 8 (eight) hours.     ondansetron 4 MG disintegrating tablet  Commonly known as:  ZOFRAN ODT  Take 1 tablet (4 mg total) by mouth every 8 (eight) hours as needed for nausea.     oxyCODONE 5 MG immediate release tablet  Commonly known as:  Oxy IR/ROXICODONE  Take 1 tablet (5 mg total) by mouth every 4 (four) hours as needed.       Follow-up Information   Follow up with GRAVES,JOHN L, MD. Schedule an appointment as soon as possible for a visit in 10 days.   Contact information:   [address] [place] [address] [phone]       Follow up with Harriette Bouillon A., MD On 04/18/2013. (arrive at 3pm )    Contact information:   [address] [place] [address] [phone]       Signed: Sherrie George 04/07/2013, 4:26 PM  Sheba Whaling 9:06 AM 04/08/2013

## 2013-04-07 NOTE — Progress Notes (Signed)
WILL NEED OUTPATIENT EGD TO EVALUATE ESOPHAGUS AND LYMPH NODE .  DIVERTICULITIS BETTR.  HOME Friday WITH DRAIN AND FOLLOW UP WITH [facility] AS OUTPATIENT.  CONT ABX.

## 2013-04-07 NOTE — Progress Notes (Addendum)
Subjective: Feels better, knees are almost back to normal.  No abdominal pain.  Drainage from perc drain is clear.  Objective: Vital signs in last 24 hours: Temp:  [97.9 F (36.6 C)-98 F (36.7 C)] 98 F (36.7 C) (04/24 0448) Pulse Rate:  [68-82] 68 (04/24 0448) Resp:  [18] 18 (04/24 0448) BP: (109-126)/(70-88) 126/88 mmHg (04/24 0448) SpO2:  [91 %-96 %] 94 % (04/24 0448) Last BM Date: 04/06/13 Perc drain ordered by Dr. Johna Sheriff 04/02/13 30 ml from his drain.  Afebrile, VSS,  WBC is up to 17.2  He got cortisone yesterday injected to each knee. CT done last PM and shows, resolution of the right paracentral pelvic collection. Improved sigmoid wall thickening.  There is a distal esophageal wall thickening and adjacent nodes which are prominent.  Some concern for esophagitis or neoplastic process. Intake/Output from previous day: 04/23 0701 - 04/24 0700 In: 895 [P.O.:360; I.V.:480; IV Piggyback:50] Out: 630 [Urine:600; Drains:30] Intake/Output this shift:    General appearance: alert, cooperative and no distress GI: soft, non-tender; bowel sounds normal; no masses,  no organomegaly Extremities: swelling in both knees is much better., He can walk and is almost back to normal.  Lab Results:   Recent Labs  04/05/13 0415 04/07/13 0439  WBC 11.8* 17.2*  HGB 12.7* 13.4  HCT 36.1* 38.2*  PLT 380 531*    BMET  Recent Labs  04/05/13 0415 04/07/13 0439  NA 135 136  K 3.3* 4.3  CL 98 99  CO2 28 29  GLUCOSE 112* 139*  BUN 12 16  CREATININE 0.85 0.80  CALCIUM 8.1* 8.8   PT/INR No results found for this basename: LABPROT, INR,  in the last 72 hours   Recent Labs Lab 04/05/13 0415  AST 26  ALT 14  ALKPHOS 40  BILITOT 0.3  PROT 6.2  ALBUMIN 2.2*     Lipase     Component Value Date/Time   LIPASE 22 03/30/2013 1906     Studies/Results: Ct Abdomen Pelvis W Contrast  04/06/2013  *RADIOLOGY REPORT*  Clinical Data: Follow up of diverticular abscess.  History of  kidney stones.  CT ABDOMEN AND PELVIS WITH CONTRAST  Technique:  Multidetector CT imaging of the abdomen and pelvis was performed following the standard protocol during bolus administration of intravenous contrast.  Contrast: OMNIPAQUE IOHEXOL 300 MG/ML  SOLN, 50mL OMNIPAQUE IOHEXOL 300 MG/ML  SOLN  Comparison: Drainage procedure of 04/02/2013.  Diagnostic CT of 04/01/2013.  Findings: Lung bases:  Clear lung bases. Normal heart size without pericardial or pleural effusion.  Distal esophageal wall thickening is moderate.  Adjacent para esophageal node is mildly enlarged at 9 mm.  Abdomen/pelvis:  Suspicion of hepatic steatosis, without focal liver lesion.  Normal spleen, stomach, pancreas, gallbladder, biliary tract, adrenal glands.  Punctate left renal collecting system calculus. No hydronephrosis. Normal right kidney.  Small retroperitoneal nodes, without adenopathy.The extent of the proximal sigmoid colonic wall thickening is decreased.  A pericolonic contrast collection anteriorly measures 3.0 x 2.2 cm versus 3.4 x 2.4 cm on the prior.  There are foci of extraluminal air within the sigmoid mesocolon, including on image 62/series 2.  These are similar to slightly decreased.  A cul-de-sac drain has been placed in the interval.  The right paracentral cul-de-sac fluid collection has resolved.  There is a persistent left pelvic gas and fluid collection which measures 1.6 x 3.6 cm on image 76/series 2.  Decreased from 4.7 x 3.2 cm at the same level on the  prior exam.  No new collections are seen.  Normal terminal ileum.  Normal small bowel without abdominal ascites.  Borderline enlarged right external iliac node 1.0 cm is unchanged. There is also borderline adenopathy along the left pelvic sidewall. Prominent nodes in the sigmoid mesocolon are presumably reactive and are unchanged since the prior.  Normal urinary bladder, without air within.  Normal prostate.  Bones/Musculoskeletal:  No acute osseous abnormality.   IMPRESSION:  1.  Interval placement of a cul-de-sac drain.  Resolution of the right paracentral pelvic collection with decreased size of extraluminal gas and fluid collections.  No new or enlarging collection identified. 2.  Improved sigmoid wall thickening. 3.  Mild pelvic adenopathy, likely reactive. 4.  Distal esophageal wall thickening with adjacent prominent nodes.  This could represent esophagitis or neoplastic process. Consider non emergent endoscopy. 5.  Left nephrolithiasis.   Original Report Authenticated By: Jeronimo Greaves, M.D.    Dg Knee 4 Views W/patella Left  04/05/2013  *RADIOLOGY REPORT*  Clinical Data: Diffuse bilateral knee pain, left greater than right.  LEFT KNEE - COMPLETE 4+ VIEW  Comparison: None.  Findings: The osseous structures appear normal except for minimal osteophytes on the lateral aspect of the patella.  However, the patient does have a prominent effusion.  There is also diffuse subcutaneous edema around the knee.  IMPRESSION: Prominent knee joint effusion.  No significant osseous abnormality.   Original Report Authenticated By: Francene Boyers, M.D.    Dg Knee 4 Views W/patella Right  04/05/2013  *RADIOLOGY REPORT*  Clinical Data: Knee pain.  RIGHT KNEE - COMPLETE 4+ VIEW  Comparison: X-rays dated 07/03/2012  Findings: Minimal lateral osteophytes on the patella. No other osseous abnormality.  Large joint effusion. Diffuse subcutaneous edema.  IMPRESSION: Large joint effusion.  Subcutaneous edema.   Original Report Authenticated By: Francene Boyers, M.D.     Medications: . allopurinol  100 mg Oral Daily  . bupivacaine  50 mL Infiltration STAT  . colchicine  0.6 mg Oral BID  . enoxaparin (LOVENOX) injection  40 mg Subcutaneous QHS  . ertapenem [facility]) IV  1 g Intravenous QHS  . furosemide  20 mg Oral Daily  . indomethacin  75 mg Oral Q breakfast  . methylPREDNISolone acetate  160 mg Intra-articular STAT  . potassium chloride  20 mEq Oral BID WC     Assessment/Plan Diverticular perforation with multiple abscesses  Improved on CT done last PM Percutaneous drain placement 04/02/13, Day 7 Invanz, day 4 of Flagyl (d/ced 04/06/13) Hx of nephrolithiasis  Gouty arthritis bilateral knees. Bilateral knee effusions left greater than right. Hx of gout, with normal uric acid. Distal esophageal wall thickening with adjacent prominent nodes. This could represent esophagitis or neoplastic process.  Pt was never a smoker. Body mass index is 41.5      Plan;  Switch him over to oral abx.  Saline lock IV, continue to mobilize.  Recheck lab in Am, if he looks good home tomorrow.  Follow up with Dr. Johna Sheriff next week with drain in. He has no primary care, nor GI doctor.  I have encouraged him to get one, I am going to up his allopurinol  LOS: 8 days  Cultures shows rare E coli he is resistant to ampicillin so I am changing him to Cipro and Flagyl PO.  Sherrie George 04/07/2013

## 2013-04-07 NOTE — Progress Notes (Addendum)
Subjective: Pt sitting up in chair , eating lunch; no new c/o  Objective: Vital signs in last 24 hours: Temp:  [97.9 F (36.6 C)-98 F (36.7 C)] 98 F (36.7 C) (04/24 0448) Pulse Rate:  [68-82] 68 (04/24 0448) Resp:  [18] 18 (04/24 0448) BP: (109-126)/(70-88) 126/88 mmHg (04/24 0448) SpO2:  [91 %-96 %] 94 % (04/24 0448) Last BM Date: 04/07/13  Intake/Output from previous day: 04/23 0701 - 04/24 0700 In: 895 [P.O.:360; I.V.:480; IV Piggyback:50] Out: 630 [Urine:600; Drains:30] Intake/Output this shift: Total I/O In: 325 [P.O.:240; I.V.:80; Other:5] Out: -   Rt TG drain intact, output about 30 cc's serous fluid, cx's -e coli; insertion site covered with clean dressing, mildly tender to palpation  Lab Results:   Recent Labs  04/05/13 0415 04/07/13 0439  WBC 11.8* 17.2*  HGB 12.7* 13.4  HCT 36.1* 38.2*  PLT 380 531*   BMET  Recent Labs  04/05/13 0415 04/07/13 0439  NA 135 136  K 3.3* 4.3  CL 98 99  CO2 28 29  GLUCOSE 112* 139*  BUN 12 16  CREATININE 0.85 0.80  CALCIUM 8.1* 8.8   PT/INR No results found for this basename: LABPROT, INR,  in the last 72 hours ABG No results found for this basename: PHART, PCO2, PO2, HCO3,  in the last 72 hours  Studies/Results: Ct Abdomen Pelvis W Contrast  04/06/2013  *RADIOLOGY REPORT*  Clinical Data: Follow up of diverticular abscess.  History of kidney stones.  CT ABDOMEN AND PELVIS WITH CONTRAST  Technique:  Multidetector CT imaging of the abdomen and pelvis was performed following the standard protocol during bolus administration of intravenous contrast.  Contrast: OMNIPAQUE IOHEXOL 300 MG/ML  SOLN, 50mL OMNIPAQUE IOHEXOL 300 MG/ML  SOLN  Comparison: Drainage procedure of 04/02/2013.  Diagnostic CT of 04/01/2013.  Findings: Lung bases:  Clear lung bases. Normal heart size without pericardial or pleural effusion.  Distal esophageal wall thickening is moderate.  Adjacent para esophageal node is mildly enlarged at 9  mm.  Abdomen/pelvis:  Suspicion of hepatic steatosis, without focal liver lesion.  Normal spleen, stomach, pancreas, gallbladder, biliary tract, adrenal glands.  Punctate left renal collecting system calculus. No hydronephrosis. Normal right kidney.  Small retroperitoneal nodes, without adenopathy.The extent of the proximal sigmoid colonic wall thickening is decreased.  A pericolonic contrast collection anteriorly measures 3.0 x 2.2 cm versus 3.4 x 2.4 cm on the prior.  There are foci of extraluminal air within the sigmoid mesocolon, including on image 62/series 2.  These are similar to slightly decreased.  A cul-de-sac drain has been placed in the interval.  The right paracentral cul-de-sac fluid collection has resolved.  There is a persistent left pelvic gas and fluid collection which measures 1.6 x 3.6 cm on image 76/series 2.  Decreased from 4.7 x 3.2 cm at the same level on the prior exam.  No new collections are seen.  Normal terminal ileum.  Normal small bowel without abdominal ascites.  Borderline enlarged right external iliac node 1.0 cm is unchanged. There is also borderline adenopathy along the left pelvic sidewall. Prominent nodes in the sigmoid mesocolon are presumably reactive and are unchanged since the prior.  Normal urinary bladder, without air within.  Normal prostate.  Bones/Musculoskeletal:  No acute osseous abnormality.  IMPRESSION:  1.  Interval placement of a cul-de-sac drain.  Resolution of the right paracentral pelvic collection with decreased size of extraluminal gas and fluid collections.  No new or enlarging collection identified. 2.  Improved  sigmoid wall thickening. 3.  Mild pelvic adenopathy, likely reactive. 4.  Distal esophageal wall thickening with adjacent prominent nodes.  This could represent esophagitis or neoplastic process. Consider non emergent endoscopy. 5.  Left nephrolithiasis.   Original Report Authenticated By: Jeronimo Greaves, M.D.    Dg Knee 4 Views W/patella  Left  04/05/2013  *RADIOLOGY REPORT*  Clinical Data: Diffuse bilateral knee pain, left greater than right.  LEFT KNEE - COMPLETE 4+ VIEW  Comparison: None.  Findings: The osseous structures appear normal except for minimal osteophytes on the lateral aspect of the patella.  However, the patient does have a prominent effusion.  There is also diffuse subcutaneous edema around the knee.  IMPRESSION: Prominent knee joint effusion.  No significant osseous abnormality.   Original Report Authenticated By: Francene Boyers, M.D.    Dg Knee 4 Views W/patella Right  04/05/2013  *RADIOLOGY REPORT*  Clinical Data: Knee pain.  RIGHT KNEE - COMPLETE 4+ VIEW  Comparison: X-rays dated 07/03/2012  Findings: Minimal lateral osteophytes on the patella. No other osseous abnormality.  Large joint effusion. Diffuse subcutaneous edema.  IMPRESSION: Large joint effusion.  Subcutaneous edema.   Original Report Authenticated By: Francene Boyers, M.D.     Anti-infectives: Anti-infectives   Start     Dose/Rate Route Frequency Ordered Stop   04/07/13 1000  amoxicillin-clavulanate (AUGMENTIN) 875-125 MG per tablet 1 tablet     1 tablet Oral Every 12 hours 04/07/13 0815     04/01/13 1800  metroNIDAZOLE (FLAGYL) IVPB 500 mg  Status:  Discontinued     500 mg 100 mL/hr over 60 Minutes Intravenous Every 8 hours 04/01/13 1737 04/05/13 0715   03/30/13 2230  ertapenem (INVANZ) 1 g in sodium chloride 0.9 % 50 mL IVPB  Status:  Discontinued     1 g 100 mL/hr over 30 Minutes Intravenous Daily at bedtime 03/30/13 2156 04/07/13 0815   03/30/13 1945  metroNIDAZOLE (FLAGYL) IVPB 500 mg     500 mg 100 mL/hr over 60 Minutes Intravenous  Once 03/30/13 1940 03/30/13 2125   03/30/13 1945  ciprofloxacin (CIPRO) IVPB 400 mg     400 mg 200 mL/hr over 60 Minutes Intravenous  Once 03/30/13 1940 03/30/13 2024      Assessment/Plan: s/p right pelvic /diverticular abscess drainage 4/19; f/u CT A/P today with improvement in pelvic collections; also with  distal esoph wall thickening ?origin- ?endoscopy; pt to go home with drain intact and f/u with [facility]; recommend once daily flushing of drain with 5 cc's sterile NS, daily dressing changes and recording of output. Would inject drain before removal to r/o fistula. Antibiotic coverage per [facility].  LOS: 8 days    Karolee Meloni,D [facility] 04/07/2013

## 2013-04-07 NOTE — Progress Notes (Signed)
Nutrition Brief Note  Patient identified on the Malnutrition Screening Tool (MST) Report  Body mass index is 41.5 kg/(m^2). Patient meets criteria for class III extreme obesity based on current BMI.   Current diet order is low fiber, patient is consuming approximately 100% of meals at this time. Labs and medications reviewed.   Met with pt who reports not eating well for 2 days PTA r/t nausea and vomiting. Pt reports before then he was eating whatever he felt like. Weight has been stable. Pt denies any nausea/vomiting today and reports eating 100% of meals.   Received consult for low fiber and CHO modified diet education. Discussed relationship between fiber and diverticulitis. Explained sources of fiber in the diet and recommended low fiber options. Also discussed CHO modified diet. Pt admits to drinking excess Kool-aid and juices PTA in addition to excess carbohydrate intake at mealtimes. Teach back method used. Handouts on both diets provided. Expect good compliance.    Levon Hedger MS, RD, LDN [phone] Pager [phone] After Hours Pager

## 2013-04-08 ENCOUNTER — Other Ambulatory Visit (INDEPENDENT_AMBULATORY_CARE_PROVIDER_SITE_OTHER): Payer: Self-pay | Admitting: General Surgery

## 2013-04-08 ENCOUNTER — Other Ambulatory Visit (INDEPENDENT_AMBULATORY_CARE_PROVIDER_SITE_OTHER): Payer: Self-pay | Admitting: Internal Medicine

## 2013-04-08 DIAGNOSIS — K5732 Diverticulitis of large intestine without perforation or abscess without bleeding: Secondary | ICD-10-CM

## 2013-04-08 LAB — CBC
Platelets: 589 10*3/uL — ABNORMAL HIGH (ref 150–400)
RBC: 4.22 MIL/uL (ref 4.22–5.81)
WBC: 18.7 10*3/uL — ABNORMAL HIGH (ref 4.0–10.5)

## 2013-04-08 MED ORDER — METRONIDAZOLE 500 MG PO TABS: 500.0000 mg | ORAL_TABLET | Freq: Three times a day (TID) | ORAL | Status: DC

## 2013-04-08 MED ORDER — OXYCODONE HCL 5 MG PO TABS: 5.0000 mg | ORAL_TABLET | ORAL | Status: DC | PRN

## 2013-04-08 MED ORDER — ALLOPURINOL 100 MG PO TABS: 200.0000 mg | ORAL_TABLET | Freq: Every day | ORAL | Status: DC

## 2013-04-08 MED ORDER — CIPROFLOXACIN HCL 500 MG PO TABS: 500.0000 mg | ORAL_TABLET | Freq: Two times a day (BID) | ORAL | Status: DC

## 2013-04-08 MED ORDER — COLCHICINE 0.6 MG PO TABS: 0.6000 mg | ORAL_TABLET | Freq: Two times a day (BID) | ORAL | Status: DC

## 2013-04-08 NOTE — Progress Notes (Signed)
Patient ID: Alec Kelly, male   DOB: 15-Nov-1966, 47 y.o.   MRN: [id number]    Subjective: No complaints, denies pain, nausea, or vomiting, feels good overall, really wants to go home.  Knees are still swollen left > right but improving.  Objective: Vital signs in last 24 hours: Temp:  [97.2 F (36.2 C)-97.8 F (36.6 C)] 97.8 F (36.6 C) (04/25 0610) Pulse Rate:  [67-101] 67 (04/25 0610) Resp:  [18-20] 18 (04/25 0610) BP: (105-134)/(73-87) 105/73 mmHg (04/25 0610) SpO2:  [94 %-98 %] 95 % (04/25 0610) Last BM Date: 04/07/13 Intake/Output from previous day: 04/24 0701 - 04/25 0700 In: 1650 [P.O.:1560; I.V.:80] Out: 35 [Drains:35] Intake/Output this shift:    General appearance: alert, cooperative and no distress GI: soft, non-tender; bowel sounds normal; no masses,  no organomegaly Extremities: swelling in both knees is much better., He can walk and is almost back to normal.  Lab Results:   Recent Labs  04/07/13 0439 04/08/13 0459  WBC 17.2* 18.7*  HGB 13.4 13.0  HCT 38.2* 37.3*  PLT 531* 589*    BMET  Recent Labs  04/07/13 0439  NA 136  K 4.3  CL 99  CO2 29  GLUCOSE 139*  BUN 16  CREATININE 0.80  CALCIUM 8.8   PT/INR No results found for this basename: LABPROT, INR,  in the last 72 hours   Recent Labs Lab 04/05/13 0415  AST 26  ALT 14  ALKPHOS 40  BILITOT 0.3  PROT 6.2  ALBUMIN 2.2*     Lipase     Component Value Date/Time   LIPASE 22 03/30/2013 1906     Studies/Results: Ct Abdomen Pelvis W Contrast  04/06/2013  *RADIOLOGY REPORT*  Clinical Data: Follow up of diverticular abscess.  History of kidney stones.  CT ABDOMEN AND PELVIS WITH CONTRAST  Technique:  Multidetector CT imaging of the abdomen and pelvis was performed following the standard protocol during bolus administration of intravenous contrast.  Contrast: OMNIPAQUE IOHEXOL 300 MG/ML  SOLN, 50mL OMNIPAQUE IOHEXOL 300 MG/ML  SOLN  Comparison: Drainage procedure of 04/02/2013.   Diagnostic CT of 04/01/2013.  Findings: Lung bases:  Clear lung bases. Normal heart size without pericardial or pleural effusion.  Distal esophageal wall thickening is moderate.  Adjacent para esophageal node is mildly enlarged at 9 mm.  Abdomen/pelvis:  Suspicion of hepatic steatosis, without focal liver lesion.  Normal spleen, stomach, pancreas, gallbladder, biliary tract, adrenal glands.  Punctate left renal collecting system calculus. No hydronephrosis. Normal right kidney.  Small retroperitoneal nodes, without adenopathy.The extent of the proximal sigmoid colonic wall thickening is decreased.  A pericolonic contrast collection anteriorly measures 3.0 x 2.2 cm versus 3.4 x 2.4 cm on the prior.  There are foci of extraluminal air within the sigmoid mesocolon, including on image 62/series 2.  These are similar to slightly decreased.  A cul-de-sac drain has been placed in the interval.  The right paracentral cul-de-sac fluid collection has resolved.  There is a persistent left pelvic gas and fluid collection which measures 1.6 x 3.6 cm on image 76/series 2.  Decreased from 4.7 x 3.2 cm at the same level on the prior exam.  No new collections are seen.  Normal terminal ileum.  Normal small bowel without abdominal ascites.  Borderline enlarged right external iliac node 1.0 cm is unchanged. There is also borderline adenopathy along the left pelvic sidewall. Prominent nodes in the sigmoid mesocolon are presumably reactive and are unchanged since the prior.  Normal  urinary bladder, without air within.  Normal prostate.  Bones/Musculoskeletal:  No acute osseous abnormality.  IMPRESSION:  1.  Interval placement of a cul-de-sac drain.  Resolution of the right paracentral pelvic collection with decreased size of extraluminal gas and fluid collections.  No new or enlarging collection identified. 2.  Improved sigmoid wall thickening. 3.  Mild pelvic adenopathy, likely reactive. 4.  Distal esophageal wall thickening with  adjacent prominent nodes.  This could represent esophagitis or neoplastic process. Consider non emergent endoscopy. 5.  Left nephrolithiasis.   Original Report Authenticated By: Jeronimo Greaves, M.D.     Medications: . allopurinol  200 mg Oral Daily  . ciprofloxacin  500 mg Oral BID  . colchicine  0.6 mg Oral BID  . enoxaparin (LOVENOX) injection  40 mg Subcutaneous QHS  . furosemide  20 mg Oral Daily  . indomethacin  75 mg Oral Q breakfast  . metroNIDAZOLE  500 mg Oral Q8H  . pantoprazole  40 mg Oral BID AC  . potassium chloride  20 mEq Oral BID WC    Assessment/Plan Diverticular perforation with multiple abscesses Hx of nephrolithiasis  Gouty arthritis bilateral knees. Bilateral knee effusions left greater than right. S/p cortisone Hx of gout, with normal uric acid. Distal esophageal wall thickening with adjacent prominent nodes. This could represent esophagitis or neoplastic process.  Pt was never a smoker. Body mass index is 41.5   Perc drain ordered by Dr. Johna Sheriff 04/02/13 35 ml from his drain.  Afebrile, VSS,  WBC is up to 18.7  He got cortisone yesterday injected to each knee. CT done last PM and shows, resolution of the right paracentral pelvic collection. Improved sigmoid wall thickening.  There is a distal esophageal wall thickening and adjacent nodes which are prominent.  Some concern for esophagitis or neoplastic process. Cultures shows rare E coli he is resistant to ampicillin so I am changing him to Cipro and Flagyl PO.  Plan: discussed with patient option of staying in house another day vs home with CBC on Monday and strict instructions to return to hospital if symptoms develop.  He would like to go home and get CBC on Monday.  We will have him follow up in 7-10 days for drain check.  He will do drain care daily and record it daily.  He will need f/u with Dr. Luiz Blare for his gout.  He needs to f/u with PCP in regards to the esophageal thickening.  Noriel Guthrie,  Guillermina Shaft 04/08/2013

## 2013-04-08 NOTE — Progress Notes (Signed)
Subjective: Rt pelvic diverticular abscess drain placed 4/19 Still with wbc 18.7 today Output 35 cc yesterday  Objective: Vital signs in last 24 hours: Temp:  [97.2 F (36.2 C)-97.8 F (36.6 C)] 97.8 F (36.6 C) (04/25 0610) Pulse Rate:  [67-101] 67 (04/25 0610) Resp:  [18-20] 18 (04/25 0610) BP: (105-134)/(73-87) 105/73 mmHg (04/25 0610) SpO2:  [94 %-98 %] 95 % (04/25 0610) Last BM Date: 04/07/13  Intake/Output from previous day: 04/24 0701 - 04/25 0700 In: 1650 [P.O.:1560; I.V.:80] Out: 35 [Drains:35] Intake/Output this shift:    PE:  Afeb; vss Up in bed Eating well Site of drain intact; clean and dry NT; no bleeding Output 35 cc yesterday; 10 cc in JP now-serous Wbc 18.7   Lab Results:   Recent Labs  04/07/13 0439 04/08/13 0459  WBC 17.2* 18.7*  HGB 13.4 13.0  HCT 38.2* 37.3*  PLT 531* 589*   BMET  Recent Labs  04/07/13 0439  NA 136  K 4.3  CL 99  CO2 29  GLUCOSE 139*  BUN 16  CREATININE 0.80  CALCIUM 8.8   PT/INR No results found for this basename: LABPROT, INR,  in the last 72 hours ABG No results found for this basename: PHART, PCO2, PO2, HCO3,  in the last 72 hours  Studies/Results: Ct Abdomen Pelvis W Contrast  04/06/2013  *RADIOLOGY REPORT*  Clinical Data: Follow up of diverticular abscess.  History of kidney stones.  CT ABDOMEN AND PELVIS WITH CONTRAST  Technique:  Multidetector CT imaging of the abdomen and pelvis was performed following the standard protocol during bolus administration of intravenous contrast.  Contrast: OMNIPAQUE IOHEXOL 300 MG/ML  SOLN, 50mL OMNIPAQUE IOHEXOL 300 MG/ML  SOLN  Comparison: Drainage procedure of 04/02/2013.  Diagnostic CT of 04/01/2013.  Findings: Lung bases:  Clear lung bases. Normal heart size without pericardial or pleural effusion.  Distal esophageal wall thickening is moderate.  Adjacent para esophageal node is mildly enlarged at 9 mm.  Abdomen/pelvis:  Suspicion of hepatic steatosis, without  focal liver lesion.  Normal spleen, stomach, pancreas, gallbladder, biliary tract, adrenal glands.  Punctate left renal collecting system calculus. No hydronephrosis. Normal right kidney.  Small retroperitoneal nodes, without adenopathy.The extent of the proximal sigmoid colonic wall thickening is decreased.  A pericolonic contrast collection anteriorly measures 3.0 x 2.2 cm versus 3.4 x 2.4 cm on the prior.  There are foci of extraluminal air within the sigmoid mesocolon, including on image 62/series 2.  These are similar to slightly decreased.  A cul-de-sac drain has been placed in the interval.  The right paracentral cul-de-sac fluid collection has resolved.  There is a persistent left pelvic gas and fluid collection which measures 1.6 x 3.6 cm on image 76/series 2.  Decreased from 4.7 x 3.2 cm at the same level on the prior exam.  No new collections are seen.  Normal terminal ileum.  Normal small bowel without abdominal ascites.  Borderline enlarged right external iliac node 1.0 cm is unchanged. There is also borderline adenopathy along the left pelvic sidewall. Prominent nodes in the sigmoid mesocolon are presumably reactive and are unchanged since the prior.  Normal urinary bladder, without air within.  Normal prostate.  Bones/Musculoskeletal:  No acute osseous abnormality.  IMPRESSION:  1.  Interval placement of a cul-de-sac drain.  Resolution of the right paracentral pelvic collection with decreased size of extraluminal gas and fluid collections.  No new or enlarging collection identified. 2.  Improved sigmoid wall thickening. 3.  Mild pelvic adenopathy,  likely reactive. 4.  Distal esophageal wall thickening with adjacent prominent nodes.  This could represent esophagitis or neoplastic process. Consider non emergent endoscopy. 5.  Left nephrolithiasis.   Original Report Authenticated By: Jeronimo Greaves, M.D.     Anti-infectives:   Assessment/Plan: s/p   Rt pelvic divertic abscess drain placed  4/19 Output serous: +Ecoli Diminishing High wbc Plan per [facility]   LOS: 9 days    Mannix Kroeker A 04/08/2013

## 2013-04-08 NOTE — Discharge Instructions (Signed)
Patient needs to go to [facility] lab on Monday, 04/11/13  for blood work.  May come at anytime.  The order is already at [facility].  [facility] lab is at [employer].   Patient is advised to return to the hospital if significant pain, nausea, vomiting or fever greater then 101.   Diverticulitis A diverticulum is a small pouch or sac on the colon. Diverticulosis is the presence of these diverticula on the colon. Diverticulitis is the irritation (inflammation) or infection of diverticula. CAUSES  The colon and its diverticula contain bacteria. If food particles block the tiny opening to a diverticulum, the bacteria inside can grow and cause an increase in pressure. This leads to infection and inflammation and is called diverticulitis. SYMPTOMS   Abdominal pain and tenderness. Usually, the pain is located on the left side of your abdomen. However, it could be located elsewhere.  Fever.  Bloating.  Feeling sick to your stomach (nausea).  Throwing up (vomiting).  Abnormal stools. DIAGNOSIS  Your caregiver will take a history and perform a physical exam. Since many things can cause abdominal pain, other tests may be necessary. Tests may include:  Blood tests.  Urine tests.  X-ray of the abdomen.  CT scan of the abdomen. Sometimes, surgery is needed to determine if diverticulitis or other conditions are causing your symptoms. TREATMENT  Most of the time, you can be treated without surgery. Treatment includes:  Resting the bowels by only having liquids for a few days. As you improve, you will need to eat a low-fiber diet.  Intravenous (IV) fluids if you are losing body fluids (dehydrated).  Antibiotic medicines that treat infections may be given.  Pain and nausea medicine, if needed.  Surgery if the inflamed diverticulum has burst. HOME CARE INSTRUCTIONS   Try a clear liquid diet (broth, tea, or water for as long as directed by your caregiver). You may then gradually begin a  low-fiber diet as tolerated. A low-fiber diet is a diet with less than 10 grams of fiber. Choose the foods below to reduce fiber in the diet:  Brittnee Gaetano breads, cereals, rice, and pasta.  Cooked fruits and vegetables or soft fresh fruits and vegetables without the skin.  Ground or well-cooked tender beef, ham, veal, lamb, pork, or poultry.  Eggs and seafood.  After your diverticulitis symptoms have improved, your caregiver may put you on a high-fiber diet. A high-fiber diet includes 14 grams of fiber for every 1000 calories consumed. For a standard 2000 calorie diet, you would need 28 grams of fiber. Follow these diet guidelines to help you increase the fiber in your diet. It is important to slowly increase the amount fiber in your diet to avoid gas, constipation, and bloating.  Choose whole-grain breads, cereals, pasta, and brown rice.  Choose fresh fruits and vegetables with the skin on. Do not overcook vegetables because the more vegetables are cooked, the more fiber is lost.  Choose more nuts, seeds, legumes, dried peas, beans, and lentils.  Look for food products that have greater than 3 grams of fiber per serving on the Nutrition Facts label.  Take all medicine as directed by your caregiver.  If your caregiver has given you a follow-up appointment, it is very important that you go. Not going could result in lasting (chronic) or permanent injury, pain, and disability. If there is any problem keeping the appointment, call to reschedule. SEEK MEDICAL CARE IF:   Your pain does not improve.  You have a hard time advancing  your diet beyond clear liquids.  Your bowel movements do not return to normal. SEEK IMMEDIATE MEDICAL CARE IF:   Your pain becomes worse.  You have an oral temperature above 102 F (38.9 C), not controlled by medicine.  You have repeated vomiting.  You have bloody or black, tarry stools.  Symptoms that brought you to your caregiver become worse or are not  getting better. MAKE SURE YOU:   Understand these instructions.  Will watch your condition.  Will get help right away if you are not doing well or get worse. Document Released: 09/10/2005 Document Revised: 02/23/2012 Document Reviewed: 01/06/2011 [facility] Patient Information 2013 [place], [place].

## 2013-04-08 NOTE — Progress Notes (Signed)
Doing very well. Think elevated WBC from gout flare up and steroid injection.  Can follow up as outpatient.  Keep drain for now and re image in next couple of weeks.

## 2013-04-08 NOTE — Discharge Summary (Signed)
Discharge

## 2013-04-09 LAB — BODY FLUID CULTURE: Culture: NO GROWTH

## 2013-04-10 LAB — ANAEROBIC CULTURE

## 2013-04-11 LAB — CBC WITH DIFFERENTIAL/PLATELET
Basophils Absolute: 0 10*3/uL (ref 0.0–0.1)
Eosinophils Relative: 2 % (ref 0–5)
Lymphocytes Relative: 17 % (ref 12–46)
Lymphs Abs: 2 10*3/uL (ref 0.7–4.0)
MCV: 87.4 fL (ref 78.0–100.0)
Neutro Abs: 8.4 10*3/uL — ABNORMAL HIGH (ref 1.7–7.7)
Neutrophils Relative %: 72 % (ref 43–77)
Platelets: 742 10*3/uL — ABNORMAL HIGH (ref 150–400)
RBC: 4.93 MIL/uL (ref 4.22–5.81)
RDW: 13.2 % (ref 11.5–15.5)
WBC: 11.7 10*3/uL — ABNORMAL HIGH (ref 4.0–10.5)

## 2013-04-15 ENCOUNTER — Telehealth (INDEPENDENT_AMBULATORY_CARE_PROVIDER_SITE_OTHER): Payer: Self-pay | Admitting: General Surgery

## 2013-04-15 NOTE — Telephone Encounter (Signed)
Ginger, pt's significant other, called to report some blood streaking noted in the drainage bag this morning.  Reassured her that this is not unexpected and is okay if not a continuous streaming of blood.  If he develops an ongoing oozing of bright red blood, she will take him immediately to the ER for evaluation.

## 2013-04-18 ENCOUNTER — Ambulatory Visit (INDEPENDENT_AMBULATORY_CARE_PROVIDER_SITE_OTHER): Payer: Self-pay | Admitting: Surgery

## 2013-04-18 ENCOUNTER — Encounter (INDEPENDENT_AMBULATORY_CARE_PROVIDER_SITE_OTHER): Payer: Self-pay | Admitting: Surgery

## 2013-04-18 VITALS — BP 118/66 | HR 68 | Temp 97.1°F | Resp 16 | Ht 67.5 in | Wt 242.0 lb

## 2013-04-18 DIAGNOSIS — K5732 Diverticulitis of large intestine without perforation or abscess without bleeding: Secondary | ICD-10-CM

## 2013-04-18 NOTE — Progress Notes (Signed)
Subjective:     Patient ID: Alec Kelly, male   DOB: 03/12/1966, 47 y.o.   MRN: [id number]  HPI He is here for followup of his hospital admission for diverticulitis with abscess. He still has a drain in place which is draining minimally. He has no nausea or vomiting. He denies abdominal pain. He denies fevers. His balance are normal  Review of Systems     Objective:   Physical Exam On exam, his abdomen is soft. It is nontender. There is only serous fluid in the drain    Assessment:     Followup diverticulitis     Plan:     I removed the drain. He will continue his antibiotics. I will see him back in 2 weeks. We will then refer him to a gastroenterologist

## 2013-04-28 ENCOUNTER — Other Ambulatory Visit (INDEPENDENT_AMBULATORY_CARE_PROVIDER_SITE_OTHER): Payer: Self-pay

## 2013-04-28 ENCOUNTER — Ambulatory Visit (INDEPENDENT_AMBULATORY_CARE_PROVIDER_SITE_OTHER): Payer: Self-pay | Admitting: Surgery

## 2013-04-28 VITALS — BP 118/84 | HR 72 | Temp 97.7°F | Resp 18 | Ht 68.0 in | Wt 249.0 lb

## 2013-04-28 DIAGNOSIS — K5732 Diverticulitis of large intestine without perforation or abscess without bleeding: Secondary | ICD-10-CM

## 2013-04-28 NOTE — Progress Notes (Signed)
Subjective:     Patient ID: Alec Kelly, male   DOB: 1966/12/15, 47 y.o.   MRN: [id number]  HPI He is here for another followup status post treatment of a diverticular abscess. He is doing well and has no complaints. He has had some night sweats but no fevers. He is moving his bowels well  Review of Systems     Objective:   Physical Exam On exam, his  Abdomen is soft and nontender    Assessment:     Resolving diverticulitis with abscess     Plan:     This was his second occurrence of diverticulitis and this time had an abscess. I believe he needs to see a gastroenterologist for a colonoscopy and then we need to discuss partial colectomy. We will make a referral to gastroenterology and I will see him back in one month

## 2013-04-29 ENCOUNTER — Telehealth (INDEPENDENT_AMBULATORY_CARE_PROVIDER_SITE_OTHER): Payer: Self-pay | Admitting: General Surgery

## 2013-04-29 ENCOUNTER — Encounter: Payer: Self-pay | Admitting: Internal Medicine

## 2013-04-29 NOTE — Telephone Encounter (Signed)
The phone number in here has been D/C  Unable to reach any of the other parties listed  He has appt with GI DR . Pyrtle on 05/16/13 1:30

## 2013-05-12 ENCOUNTER — Telehealth (INDEPENDENT_AMBULATORY_CARE_PROVIDER_SITE_OTHER): Payer: Self-pay | Admitting: General Surgery

## 2013-05-12 NOTE — Telephone Encounter (Signed)
Patient called complaining of two episodes of bright red blood in his stool. One yesterday and one last Friday night. He does not have a history of hemorrhoids. He does sit all day because he is a [employer]. He is moving his bowels 2-3 times daily. No trouble with eating/nausea. He did start having some lower abdominal pain yesterday. Please advise. [phone].

## 2013-05-12 NOTE — Telephone Encounter (Signed)
Called patient to come in tomorrow to be seen the urgent office

## 2013-05-13 ENCOUNTER — Ambulatory Visit (INDEPENDENT_AMBULATORY_CARE_PROVIDER_SITE_OTHER): Payer: Self-pay | Admitting: General Surgery

## 2013-05-13 ENCOUNTER — Encounter (INDEPENDENT_AMBULATORY_CARE_PROVIDER_SITE_OTHER): Payer: Self-pay | Admitting: General Surgery

## 2013-05-13 VITALS — BP 124/82 | HR 69 | Temp 98.2°F | Resp 18 | Ht 68.0 in | Wt 251.0 lb

## 2013-05-13 DIAGNOSIS — K921 Melena: Secondary | ICD-10-CM | POA: Insufficient documentation

## 2013-05-13 NOTE — Telephone Encounter (Signed)
If persists, needs to go to ER If not, still needs to see gastroenterologist

## 2013-05-13 NOTE — Telephone Encounter (Signed)
Spoke with Ginger and she states patient's pain is the same. She does state the rectal bleeding has gotten progressively worse. Appt was made yesterday for evaluation in the urgent office. I told them since bleeding is worse to go ahead and keep that appt but he should make a follow up with his GI MD to evaluate this persistent abdominal pain and make sure he is not getting another flare of diverticulitis. They will call today to make that appt.

## 2013-05-13 NOTE — Patient Instructions (Signed)
Keep follow up with Dr. Magnus Ivan. Will need colonoscopy

## 2013-05-13 NOTE — Progress Notes (Signed)
Subjective:     Patient ID: Alec Kelly, male   DOB: 03-02-1966, 47 y.o.   MRN: [id number]  HPI The patient is a 47 year old white male who is currently being cared for by Dr. Magnus Ivan for an episode of complicated diverticulitis with an abscess. He was last seen just a couple of weeks ago. His main complaint today is of blood in his stool. He does not have any rectal pain. 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 He denies any fevers or chills.  Review of Systems  Constitutional: Negative.   HENT: Negative.   Eyes: Negative.   Respiratory: Negative.   Cardiovascular: Negative.   Gastrointestinal: Positive for abdominal pain and blood in stool.  Endocrine: Negative.   Genitourinary: Negative.   Musculoskeletal: Negative.   Skin: Negative.   Allergic/Immunologic: Negative.   Neurological: Negative.   Hematological: Negative.   Psychiatric/Behavioral: Negative.        Objective:   Physical Exam  Constitutional: He is oriented to person, place, and time. He appears well-developed and well-nourished.  HENT:  Head: Normocephalic and atraumatic.  Eyes: Conjunctivae and EOM are normal. Pupils are equal, round, and reactive to light.  Neck: Normal range of motion. Neck supple.  Cardiovascular: Normal rate, regular rhythm and normal heart sounds.   Pulmonary/Chest: Effort normal and breath sounds normal.  Abdominal: Soft. Bowel sounds are normal.  There is mild suprapubic discomfort  Genitourinary: Rectum normal.  There is one small soft external hemorrhoidal skin tag posteriorly on the left. He has good rectal tone. There is no palpable mass on digital exam. He would not allow an anoscope could be placed.  Musculoskeletal: Normal range of motion.  Neurological: He is alert and oriented to person, place, and time.  Skin: Skin is warm and dry.  Psychiatric: He has a normal mood and affect. His behavior is normal.        Assessment:     The patient has been having some blood in his stool and is still having some suprapubic discomfort. I suspect these are residual symptoms from his diverticulitis. It is certainly possible he could have some internal hemorrhoids causing the bleeding. At his last appointment with Dr. Magnus Ivan Dr. Magnus Ivan recommended getting a colonoscopy on him. I think this would evaluate his diverticulitis as well as distinguish between diverticulitis and internal hemorrhoids for a source of the blood in his stool    Plan:     He has an appointment with Dr. Magnus Ivan in the next couple weeks. I have encouraged him to keep this appointment. I do not think there is any acute intervention that needs to be done today.

## 2013-05-16 ENCOUNTER — Encounter: Payer: Self-pay | Admitting: Internal Medicine

## 2013-05-16 ENCOUNTER — Ambulatory Visit: Payer: Self-pay | Admitting: Internal Medicine

## 2013-05-20 ENCOUNTER — Encounter (INDEPENDENT_AMBULATORY_CARE_PROVIDER_SITE_OTHER): Payer: Self-pay | Admitting: Surgery

## 2013-05-20 ENCOUNTER — Ambulatory Visit (INDEPENDENT_AMBULATORY_CARE_PROVIDER_SITE_OTHER): Payer: Self-pay | Admitting: Surgery

## 2013-05-20 VITALS — BP 120/76 | HR 80 | Temp 97.3°F | Resp 14 | Ht 68.0 in | Wt 254.0 lb

## 2013-05-20 DIAGNOSIS — K5732 Diverticulitis of large intestine without perforation or abscess without bleeding: Secondary | ICD-10-CM

## 2013-05-20 NOTE — Progress Notes (Signed)
Subjective:     Patient ID: Alec Kelly, male   DOB: Mar 13, 1966, 47 y.o.   MRN: [id number]  HPI He is here for another visit. He has not had any further blood in his bowel movements. He has no pain. He is eating well moving his bowels well. He has questionable night sweats  Review of Systems     Objective:   Physical Exam On exam, he is afebrile his vital signs are stable. His abdomen is completely soft and nontender    Assessment:     Resolving diverticulitis with abscess     Plan:     Again, this is his second bout and had significant abscesses drained. I believe he needs an elective resection of this area. I however also believe that he does need to see the gastroenterologist and had a preoperative colonoscopy. He has apparently canceled his original appointment but says he will reschedule with Dr. Rhea Belton.  I will see him back in approximately a month

## 2013-05-25 ENCOUNTER — Encounter: Payer: Self-pay | Admitting: Internal Medicine

## 2013-06-20 ENCOUNTER — Encounter (INDEPENDENT_AMBULATORY_CARE_PROVIDER_SITE_OTHER): Payer: Self-pay | Admitting: Surgery

## 2013-06-21 ENCOUNTER — Encounter: Payer: Self-pay | Admitting: Internal Medicine

## 2013-06-29 ENCOUNTER — Ambulatory Visit: Payer: Self-pay | Admitting: Internal Medicine

## 2013-07-04 ENCOUNTER — Encounter (INDEPENDENT_AMBULATORY_CARE_PROVIDER_SITE_OTHER): Payer: Self-pay | Admitting: Surgery

## 2013-10-08 ENCOUNTER — Emergency Department (HOSPITAL_BASED_OUTPATIENT_CLINIC_OR_DEPARTMENT_OTHER)
Admission: EM | Admit: 2013-10-08 | Discharge: 2013-10-08 | Disposition: A | Discharge status: Home / Self Care | Payer: Self-pay | Attending: Emergency Medicine | Admitting: Emergency Medicine

## 2013-10-08 ENCOUNTER — Encounter (HOSPITAL_BASED_OUTPATIENT_CLINIC_OR_DEPARTMENT_OTHER): Payer: Self-pay | Admitting: Emergency Medicine

## 2013-10-08 DIAGNOSIS — M79609 Pain in unspecified limb: Secondary | ICD-10-CM | POA: Insufficient documentation

## 2013-10-08 DIAGNOSIS — Z79899 Other long term (current) drug therapy: Secondary | ICD-10-CM | POA: Insufficient documentation

## 2013-10-08 DIAGNOSIS — G5600 Carpal tunnel syndrome, unspecified upper limb: Secondary | ICD-10-CM | POA: Insufficient documentation

## 2013-10-08 DIAGNOSIS — Z87442 Personal history of urinary calculi: Secondary | ICD-10-CM | POA: Insufficient documentation

## 2013-10-08 DIAGNOSIS — R011 Cardiac murmur, unspecified: Secondary | ICD-10-CM | POA: Insufficient documentation

## 2013-10-08 DIAGNOSIS — G5602 Carpal tunnel syndrome, left upper limb: Secondary | ICD-10-CM

## 2013-10-08 DIAGNOSIS — Z8719 Personal history of other diseases of the digestive system: Secondary | ICD-10-CM | POA: Insufficient documentation

## 2013-10-08 NOTE — ED Notes (Signed)
After wrist splint applied patient complains of increased pain radiating to elbow. MD informed of same and new orders for sling received

## 2013-10-08 NOTE — ED Provider Notes (Signed)
CSN: [id number]     Arrival date & time 10/08/13  4098 History   First MD Initiated Contact with Patient 10/08/13 0701     Chief Complaint  Patient presents with  . Elbow Pain   (Consider location/radiation/quality/duration/timing/severity/associated sxs/prior Treatment) HPI  Patient is a 47 yo male with history of gout and carpal tunnel syndrome who presents with left elbow pain radiating to his left ring and middle fingers. Notes this has been going on for the past week. Describes as a throbbing pain in his elbow and a burning pain in his fingers. This pain occurs mostly at night and woke him from sleep when he first noted this. Notes this started after beginning a new job in which he uses his hands more frequently. He denies injury to the area and states it does not feel like his gout. States took 5 ibuprofen at once without benefit.  Past Medical History  Diagnosis Date  . Kidney stones   . Diverticulitis   . Diverticulitis of sigmoid colon 04/07/2013    With perforation  . Obesity, Class III, BMI 40-49.9 (morbid obesity) 04/07/2013  . 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 Tonsillectomy    . Appendectomy    . Hernia repair    . Lithotripsy     Family History  Problem Relation Age of Onset  . Heart failure Mother   . Diabetes Mother   . Hypertension Mother   . Hyperlipidemia Mother   . COPD Mother   . Cancer Father     lung & prostate  . Hyperlipidemia Father   . Heart disease Father    History  Substance Use Topics  . Smoking status: Never Smoker   . Smokeless tobacco: Never Used  . Alcohol Use: Yes    Review of Systems  Respiratory: Negative for chest tightness and shortness of breath.   Cardiovascular: Negative for chest pain.  Gastrointestinal: Negative for abdominal pain.  Musculoskeletal:       Pain in left elbow and left ring and middle fingers  Neurological: Negative for weakness and numbness.     Allergies  Review of patient's allergies indicates no known allergies.  Home Medications   Current Outpatient Rx  Name  Route  Sig  Dispense  Refill  . allopurinol (ZYLOPRIM) 100 MG tablet   Oral   Take 2 tablets (200 mg total) by mouth daily.   30 tablet   1   . ibuprofen (ADVIL,MOTRIN) 200 MG tablet   Oral   Take 800 mg by mouth every 8 (eight) hours as needed for pain.          BP 146/107  Pulse 57  Temp(Src) 97.4 F (36.3 C) (Oral)  Resp 20  Ht 5\' 6"  (1.676 m)  Wt 270 lb (122.471 kg)  BMI 43.6 kg/m2  SpO2 97% Physical Exam  Constitutional: He appears well-developed and well-nourished. No distress.  HENT:  Head: Normocephalic and atraumatic.  Mouth/Throat: Oropharynx is clear and moist.  Eyes: Pupils are equal, round, and reactive to light.  Cardiovascular: Normal rate and regular rhythm.   Murmur (2/6 systolic murmur) heard. Pulmonary/Chest: Effort normal and breath sounds normal. No respiratory distress.  Musculoskeletal: He exhibits no edema.  Left elbow non-tender to palpation, full ROM, mild pain near lateral epicondyle with resisted pronation  Neurological: He is alert.  5/5 strength in bilateral grip, biceps, and triceps, and left supination and pronation, sensation to  light touch intact in bilateral upper and lower extremities  Skin: Skin is warm and dry.    ED Course  Procedures (including critical care time) Labs Review Labs Reviewed - No data to display Imaging Review No results found.  EKG Interpretation   None       MDM   1. Carpal tunnel syndrome on left    Patient with left elbow and finger pain occuring almost exclusively at night and with history of carpal tunnel in the past. Likely indicates carpal tunnel syndrome, though patient with worsened pain when placed in splint so this may indicate another cause of pain such as compression of radial nerve though less likely with no neurological deficits. Potentially could be related to  a musculoskeletal cause as had mild pain with resisted pronation, though no tenderness to palpation on exam. Placed in a sling. Advised to use ibuprofen for pain control. Given contact information for Dr Janee Morn for ortho follow-up. Patient was also given the resource guide to help in finding a PCP for follow-up of his blood pressure and heart murmur. Given return precautions.  This patient was discussed with my attending Dr Radford Pax.  Marikay Alar, MD [facility] PGY-2 10/08/13 4:20 pm  Glori Luis, MD 10/08/13 [phone]

## 2013-10-08 NOTE — ED Notes (Signed)
Pt reports pain in left elbow for about 1 week, no injury.

## 2013-10-14 NOTE — ED Provider Notes (Signed)
I saw and evaluated the patient, reviewed the resident's note and I agree with the findings and plan.   .Face to face Exam:  General:  Awake HEENT:  Atraumatic Resp:  Normal effort Abd:  Nondistended Neuro:No focal weakness  Nelia Shi, MD 10/14/13 1208

## 2013-12-15 DIAGNOSIS — I350 Nonrheumatic aortic (valve) stenosis: Secondary | ICD-10-CM

## 2013-12-15 DIAGNOSIS — I712 Thoracic aortic aneurysm, without rupture: Secondary | ICD-10-CM

## 2013-12-15 DIAGNOSIS — I7122 Aneurysm of the aortic arch, without rupture: Secondary | ICD-10-CM

## 2013-12-15 HISTORY — DX: Thoracic aortic aneurysm, without rupture: I71.2

## 2013-12-15 HISTORY — DX: Nonrheumatic aortic (valve) stenosis: I35.0

## 2013-12-15 HISTORY — DX: Aneurysm of the aortic arch, without rupture: I71.22

## 2014-03-14 ENCOUNTER — Emergency Department (HOSPITAL_COMMUNITY): Payer: Self-pay

## 2014-03-14 ENCOUNTER — Encounter (HOSPITAL_COMMUNITY): Payer: Self-pay | Admitting: Emergency Medicine

## 2014-03-14 ENCOUNTER — Inpatient Hospital Stay (HOSPITAL_COMMUNITY)
Admission: EM | Admit: 2014-03-14 | Discharge: 2014-03-20 | DRG: 392 | Disposition: A | Discharge status: Home / Self Care | Payer: Self-pay | Attending: Internal Medicine | Admitting: Internal Medicine

## 2014-03-14 DIAGNOSIS — Z87442 Personal history of urinary calculi: Secondary | ICD-10-CM

## 2014-03-14 DIAGNOSIS — K209 Esophagitis, unspecified without bleeding: Secondary | ICD-10-CM | POA: Diagnosis present

## 2014-03-14 DIAGNOSIS — E66813 Obesity, class 3: Secondary | ICD-10-CM | POA: Diagnosis present

## 2014-03-14 DIAGNOSIS — Z9119 Patient's noncompliance with other medical treatment and regimen: Secondary | ICD-10-CM

## 2014-03-14 DIAGNOSIS — R000 Tachycardia, unspecified: Secondary | ICD-10-CM | POA: Diagnosis present

## 2014-03-14 DIAGNOSIS — Z91199 Patient's noncompliance with other medical treatment and regimen due to unspecified reason: Secondary | ICD-10-CM

## 2014-03-14 DIAGNOSIS — M109 Gout, unspecified: Secondary | ICD-10-CM | POA: Diagnosis present

## 2014-03-14 DIAGNOSIS — Z833 Family history of diabetes mellitus: Secondary | ICD-10-CM

## 2014-03-14 DIAGNOSIS — K5732 Diverticulitis of large intestine without perforation or abscess without bleeding: Principal | ICD-10-CM | POA: Diagnosis present

## 2014-03-14 DIAGNOSIS — K5900 Constipation, unspecified: Secondary | ICD-10-CM | POA: Diagnosis present

## 2014-03-14 DIAGNOSIS — Z8249 Family history of ischemic heart disease and other diseases of the circulatory system: Secondary | ICD-10-CM

## 2014-03-14 DIAGNOSIS — Z6841 Body Mass Index (BMI) 40.0 and over, adult: Secondary | ICD-10-CM

## 2014-03-14 DIAGNOSIS — R1032 Left lower quadrant pain: Secondary | ICD-10-CM | POA: Diagnosis present

## 2014-03-14 DIAGNOSIS — D72829 Elevated white blood cell count, unspecified: Secondary | ICD-10-CM | POA: Diagnosis present

## 2014-03-14 DIAGNOSIS — R112 Nausea with vomiting, unspecified: Secondary | ICD-10-CM | POA: Diagnosis present

## 2014-03-14 DIAGNOSIS — K5792 Diverticulitis of intestine, part unspecified, without perforation or abscess without bleeding: Secondary | ICD-10-CM | POA: Diagnosis present

## 2014-03-14 LAB — COMPREHENSIVE METABOLIC PANEL
ALT: 22 U/L (ref 0–53)
AST: 16 U/L (ref 0–37)
Albumin: 3.9 g/dL (ref 3.5–5.2)
Alkaline Phosphatase: 62 U/L (ref 39–117)
BUN: 17 mg/dL (ref 6–23)
CALCIUM: 9.4 mg/dL (ref 8.4–10.5)
CO2: 21 mEq/L (ref 19–32)
CREATININE: 1.25 mg/dL (ref 0.50–1.35)
Chloride: 99 mEq/L (ref 96–112)
GFR calc non Af Amer: 67 mL/min — ABNORMAL LOW (ref >90)
GFR, EST AFRICAN AMERICAN: 78 mL/min — AB (ref >90)
GLUCOSE: 128 mg/dL — AB (ref 70–99)
Potassium: 3.9 mEq/L (ref 3.7–5.3)
SODIUM: 137 meq/L (ref 137–147)
TOTAL PROTEIN: 7.8 g/dL (ref 6.0–8.3)
Total Bilirubin: 0.8 mg/dL (ref 0.3–1.2)

## 2014-03-14 LAB — CBC WITH DIFFERENTIAL/PLATELET
Basophils Absolute: 0 10*3/uL (ref 0.0–0.1)
Basophils Relative: 0 % (ref 0–1)
EOS ABS: 0.1 10*3/uL (ref 0.0–0.7)
EOS PCT: 1 % (ref 0–5)
HCT: 46.1 % (ref 39.0–52.0)
Hemoglobin: 16.4 g/dL (ref 13.0–17.0)
LYMPHS ABS: 1.9 10*3/uL (ref 0.7–4.0)
Lymphocytes Relative: 13 % (ref 12–46)
MCH: 31.6 pg (ref 26.0–34.0)
MCHC: 35.6 g/dL (ref 30.0–36.0)
MCV: 88.8 fL (ref 78.0–100.0)
MONO ABS: 1.3 10*3/uL — AB (ref 0.1–1.0)
Monocytes Relative: 9 % (ref 3–12)
Neutro Abs: 11 10*3/uL — ABNORMAL HIGH (ref 1.7–7.7)
Neutrophils Relative %: 77 % (ref 43–77)
PLATELETS: 262 10*3/uL (ref 150–400)
RBC: 5.19 MIL/uL (ref 4.22–5.81)
RDW: 12.6 % (ref 11.5–15.5)
WBC: 14.4 10*3/uL — ABNORMAL HIGH (ref 4.0–10.5)

## 2014-03-14 MED ORDER — ONDANSETRON 8 MG PO TBDP
8.0000 mg | ORAL_TABLET | Freq: Once | ORAL | Status: AC
  Administered 2014-03-14: 8 mg via ORAL
  Filled 2014-03-14: qty 1

## 2014-03-14 MED ORDER — HYDROMORPHONE HCL PF 1 MG/ML IJ SOLN
1.0000 mg | Freq: Once | INTRAMUSCULAR | Status: AC
  Administered 2014-03-14: 1 mg via INTRAVENOUS
  Filled 2014-03-14: qty 1

## 2014-03-14 MED ORDER — SODIUM CHLORIDE 0.9 % IV BOLUS (SEPSIS)
1000.0000 mL | Freq: Once | INTRAVENOUS | Status: AC
  Administered 2014-03-14: 1000 mL via INTRAVENOUS

## 2014-03-14 MED ORDER — ONDANSETRON HCL 4 MG/2ML IJ SOLN
4.0000 mg | Freq: Once | INTRAMUSCULAR | Status: AC
  Administered 2014-03-14: 4 mg via INTRAVENOUS
  Filled 2014-03-14: qty 2

## 2014-03-14 MED ORDER — IOHEXOL 300 MG/ML  SOLN
50.0000 mL | Freq: Once | INTRAMUSCULAR | Status: AC | PRN
  Administered 2014-03-14: 50 mL via ORAL

## 2014-03-14 NOTE — ED Notes (Signed)
Pt reports lower mid ab pain that started 2 days ago and is getting worse. Pt has hx of diverticulitis. Pt reports nausea and loose bowel but no vomiting. Pt alert and ambulatory to triage. Pt states that he feels as if he is going to pass out.

## 2014-03-15 ENCOUNTER — Encounter (HOSPITAL_COMMUNITY): Payer: Self-pay | Admitting: *Deleted

## 2014-03-15 DIAGNOSIS — K5732 Diverticulitis of large intestine without perforation or abscess without bleeding: Principal | ICD-10-CM

## 2014-03-15 DIAGNOSIS — M109 Gout, unspecified: Secondary | ICD-10-CM | POA: Diagnosis present

## 2014-03-15 DIAGNOSIS — K5792 Diverticulitis of intestine, part unspecified, without perforation or abscess without bleeding: Secondary | ICD-10-CM | POA: Diagnosis present

## 2014-03-15 DIAGNOSIS — R1032 Left lower quadrant pain: Secondary | ICD-10-CM | POA: Diagnosis present

## 2014-03-15 DIAGNOSIS — D72829 Elevated white blood cell count, unspecified: Secondary | ICD-10-CM

## 2014-03-15 LAB — CBC
HCT: 41.9 % (ref 39.0–52.0)
Hemoglobin: 14.2 g/dL (ref 13.0–17.0)
MCH: 30.7 pg (ref 26.0–34.0)
MCHC: 33.9 g/dL (ref 30.0–36.0)
MCV: 90.7 fL (ref 78.0–100.0)
Platelets: 226 10*3/uL (ref 150–400)
RBC: 4.62 MIL/uL (ref 4.22–5.81)
RDW: 12.7 % (ref 11.5–15.5)
WBC: 13.6 10*3/uL — AB (ref 4.0–10.5)

## 2014-03-15 LAB — URINALYSIS, ROUTINE W REFLEX MICROSCOPIC
BILIRUBIN URINE: NEGATIVE
Glucose, UA: NEGATIVE mg/dL
Hgb urine dipstick: NEGATIVE
Ketones, ur: NEGATIVE mg/dL
LEUKOCYTES UA: NEGATIVE
NITRITE: NEGATIVE
PH: 5.5 (ref 5.0–8.0)
Protein, ur: NEGATIVE mg/dL
SPECIFIC GRAVITY, URINE: 1.023 (ref 1.005–1.030)
UROBILINOGEN UA: 0.2 mg/dL (ref 0.0–1.0)

## 2014-03-15 LAB — BASIC METABOLIC PANEL
BUN: 17 mg/dL (ref 6–23)
CO2: 27 mEq/L (ref 19–32)
CREATININE: 1.36 mg/dL — AB (ref 0.50–1.35)
Calcium: 8.9 mg/dL (ref 8.4–10.5)
Chloride: 98 mEq/L (ref 96–112)
GFR calc non Af Amer: 61 mL/min — ABNORMAL LOW (ref >90)
GFR, EST AFRICAN AMERICAN: 70 mL/min — AB (ref >90)
Glucose, Bld: 112 mg/dL — ABNORMAL HIGH (ref 70–99)
Potassium: 4.4 mEq/L (ref 3.7–5.3)
Sodium: 137 mEq/L (ref 137–147)

## 2014-03-15 MED ORDER — CIPROFLOXACIN IN D5W 400 MG/200ML IV SOLN
400.0000 mg | Freq: Once | INTRAVENOUS | Status: AC
  Administered 2014-03-15: 400 mg via INTRAVENOUS
  Filled 2014-03-15: qty 200

## 2014-03-15 MED ORDER — ENOXAPARIN SODIUM 40 MG/0.4ML ~~LOC~~ SOLN: 40.0000 mg | SUBCUTANEOUS | Status: DC

## 2014-03-15 MED ORDER — SODIUM CHLORIDE 0.9 % IV SOLN
INTRAVENOUS | Status: DC
  Administered 2014-03-15 – 2014-03-16 (×3): via INTRAVENOUS
  Administered 2014-03-17: 1000 mL via INTRAVENOUS
  Administered 2014-03-17 – 2014-03-19 (×5): via INTRAVENOUS

## 2014-03-15 MED ORDER — ENOXAPARIN SODIUM 60 MG/0.6ML ~~LOC~~ SOLN
60.0000 mg | Freq: Every day | SUBCUTANEOUS | Status: DC
  Administered 2014-03-15 – 2014-03-20 (×6): 60 mg via SUBCUTANEOUS
  Filled 2014-03-15 (×6): qty 0.6

## 2014-03-15 MED ORDER — HYDROMORPHONE HCL PF 1 MG/ML IJ SOLN: 1.0000 mg | INTRAMUSCULAR | Status: DC | PRN

## 2014-03-15 MED ORDER — ONDANSETRON HCL 4 MG PO TABS: 4.0000 mg | ORAL_TABLET | Freq: Four times a day (QID) | ORAL | Status: DC | PRN

## 2014-03-15 MED ORDER — ACETAMINOPHEN 325 MG PO TABS
650.0000 mg | ORAL_TABLET | Freq: Four times a day (QID) | ORAL | Status: DC | PRN
  Administered 2014-03-15 – 2014-03-17 (×2): 650 mg via ORAL
  Filled 2014-03-15 (×2): qty 2

## 2014-03-15 MED ORDER — HYDROMORPHONE HCL PF 1 MG/ML IJ SOLN: 0.5000 mg | INTRAMUSCULAR | Status: DC | PRN

## 2014-03-15 MED ORDER — ONDANSETRON HCL 4 MG/2ML IJ SOLN: 4.0000 mg | Freq: Three times a day (TID) | INTRAMUSCULAR | Status: DC | PRN

## 2014-03-15 MED ORDER — METRONIDAZOLE IN NACL 5-0.79 MG/ML-% IV SOLN
500.0000 mg | Freq: Three times a day (TID) | INTRAVENOUS | Status: DC
  Administered 2014-03-15 – 2014-03-16 (×5): 500 mg via INTRAVENOUS
  Filled 2014-03-15 (×6): qty 100

## 2014-03-15 MED ORDER — CIPROFLOXACIN IN D5W 400 MG/200ML IV SOLN
400.0000 mg | Freq: Two times a day (BID) | INTRAVENOUS | Status: DC
  Administered 2014-03-15 – 2014-03-16 (×3): 400 mg via INTRAVENOUS
  Filled 2014-03-15 (×4): qty 200

## 2014-03-15 MED ORDER — ALUM & MAG HYDROXIDE-SIMETH 200-200-20 MG/5ML PO SUSP: 30.0000 mL | Freq: Four times a day (QID) | ORAL | Status: DC | PRN

## 2014-03-15 MED ORDER — ONDANSETRON HCL 4 MG/2ML IJ SOLN
4.0000 mg | Freq: Four times a day (QID) | INTRAMUSCULAR | Status: DC | PRN
  Administered 2014-03-16: 4 mg via INTRAVENOUS
  Filled 2014-03-15: qty 2

## 2014-03-15 MED ORDER — IOHEXOL 300 MG/ML  SOLN
100.0000 mL | Freq: Once | INTRAMUSCULAR | Status: AC | PRN
  Administered 2014-03-15: 100 mL via INTRAVENOUS

## 2014-03-15 MED ORDER — METRONIDAZOLE IN NACL 5-0.79 MG/ML-% IV SOLN
500.0000 mg | Freq: Once | INTRAVENOUS | Status: DC
  Filled 2014-03-15: qty 100

## 2014-03-15 MED ORDER — OXYCODONE HCL 5 MG PO TABS
5.0000 mg | ORAL_TABLET | ORAL | Status: DC | PRN
  Administered 2014-03-15 – 2014-03-17 (×7): 5 mg via ORAL
  Filled 2014-03-15 (×7): qty 1

## 2014-03-15 MED ORDER — ACETAMINOPHEN 650 MG RE SUPP: 650.0000 mg | Freq: Four times a day (QID) | RECTAL | Status: DC | PRN

## 2014-03-15 NOTE — Care Management Note (Signed)
   CARE MANAGEMENT NOTE 03/15/2014  Patient:  Alec Kelly, Alec Kelly   Account Number:  [id number]  Date Initiated:  03/15/2014  Documentation initiated by:  Aurther Harlin  Subjective/Objective Assessment:   48 yo male admitted with diverticulitis. No PCP on record.     Action/Plan:   Home when stable   Anticipated DC Date:     Anticipated DC Plan:  HOME/SELF CARE  In-house referral  Advertising account planner  CM consult  [place] [facility]      Choice offered to / List presented to:  NA   DME arranged  NA      DME agency  NA     [place] arranged  NA      [place] agency  NA   Status of service:  In process, will continue to follow Medicare Important Message given?   (If response is "NO", the following Medicare IM given date fields will be blank) Date Medicare IM given:   Date Additional Medicare IM given:    Discharge Disposition:    Per UR Regulation:  Reviewed for med. necessity/level of care/duration of stay  If discussed at [place] of Stay Meetings, dates discussed:    Comments:  03/15/14 [place] [phone] Chart reviewed for utilization of services. Financial counselor to consult. Patient qualifies for medication assistance upon discharge. No PCP on record, patientscheduled an appointment at the [facility].

## 2014-03-15 NOTE — ED Notes (Signed)
Dr. Jenkins at bedside. 

## 2014-03-15 NOTE — ED Provider Notes (Signed)
CSN: [id number]     Arrival date & time 03/14/14  2101 History   First MD Initiated Contact with Patient 03/14/14 2227     Chief Complaint  Patient presents with  . Abdominal Pain     (Consider location/radiation/quality/duration/timing/severity/associated sxs/prior Treatment) Patient is a 48 y.o. male presenting with abdominal pain. The history is provided by the patient and medical records.  Abdominal Pain Associated symptoms: nausea    This is a 48 year old male with past medical history significant for kidney stones, diverticulitis, gout, presenting to the ED for left lower quadrant abdominal pain, onset 3 days ago. Patient states pain started on Sunday afternoon when he was driving back home from out of town.  States he noticed some discomfort when hitting bumps in the road.  States over the past 2 days the pain has been progressively worsening, described as a sharp, stabbing sensation in his left lower abdomen. He endorses some associated nausea and loose stools but denies vomiting. No hematochezia.  Denies fevers or chills. Patient has prior history of diverticulitis with bowel perforation 2014 which resolved with IV antibiotics. Prior abdominal surgeries include appendectomy, hernia repair, lithotripsy.  Patient tachycardic on arrival.  Past Medical History  Diagnosis Date  . Kidney stones   . Diverticulitis   . Diverticulitis of sigmoid colon 04/07/2013    With perforation  . Obesity, Class III, BMI 40-49.9 (morbid obesity) 04/07/2013  . 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 Tonsillectomy    . Appendectomy    . Hernia repair    . Lithotripsy     Family History  Problem Relation Age of Onset  . Heart failure Mother   . Diabetes Mother   . Hypertension Mother   . Hyperlipidemia Mother   . COPD Mother   . Cancer Father     lung & prostate  . Hyperlipidemia Father   . Heart disease Father    History  Substance Use  Topics  . Smoking status: Never Smoker   . Smokeless tobacco: Never Used  . Alcohol Use: Yes    Review of Systems  Gastrointestinal: Positive for nausea and abdominal pain.  All other systems reviewed and are negative.   Allergies  Review of patient's allergies indicates no known allergies.  Home Medications  No current outpatient prescriptions on file. BP 111/73  Pulse 108  Temp(Src) 99.7 F (37.6 C) (Oral)  Resp 14  SpO2 93%  Physical Exam  Nursing note and vitals reviewed. Constitutional: He is oriented to person, place, and time. He appears well-developed and well-nourished. No distress.  HENT:  Head: Normocephalic and atraumatic.  Mouth/Throat: Oropharynx is clear and moist.  Eyes: Conjunctivae and EOM are normal. Pupils are equal, round, and reactive to light.  Neck: Normal range of motion. Neck supple.  Cardiovascular: Normal rate, regular rhythm and normal heart sounds.   Pulmonary/Chest: Effort normal and breath sounds normal. No respiratory distress. He has no wheezes.  Abdominal: Soft. Bowel sounds are normal. There is tenderness in the periumbilical area, suprapubic area and left lower quadrant. There is no rigidity, no guarding and no CVA tenderness.  Abdomen soft, nondistended, tenderness of periumbilical, suprapubic, and left lower quadrant without rebound or guarding  Musculoskeletal: Normal range of motion. He exhibits no edema.  Neurological: He is alert and oriented to person, place, and time.  Skin: Skin is warm and dry. He is not diaphoretic.  Psychiatric: He has a normal  mood and affect.    ED Course  Procedures (including critical care time) Labs Review Labs Reviewed  CBC WITH DIFFERENTIAL - Abnormal; Notable for the following:    WBC 14.4 (*)    Neutro Abs 11.0 (*)    Monocytes Absolute 1.3 (*)    All other components within normal limits  COMPREHENSIVE METABOLIC PANEL - Abnormal; Notable for the following:    Glucose, Bld 128 (*)    GFR  calc non Af Amer 67 (*)    GFR calc Af Amer 78 (*)    All other components within normal limits  URINALYSIS, ROUTINE W REFLEX MICROSCOPIC   Imaging Review Ct Abdomen Pelvis W Contrast  03/15/2014   CLINICAL DATA:  Left lower quadrant pain. History of prior diverticulitis.  EXAM: CT ABDOMEN AND PELVIS WITH CONTRAST  TECHNIQUE: Multidetector CT imaging of the abdomen and pelvis was performed using the standard protocol following bolus administration of intravenous contrast.  CONTRAST:  16mL OMNIPAQUE IOHEXOL 300 MG/ML SOLN, 179mL OMNIPAQUE IOHEXOL 300 MG/ML SOLN  COMPARISON:  Prior CT abdomen/ pelvis 04/06/2013  FINDINGS: Lower Chest: The visualized lung bases are clear. Visualized cardiac structures within normal limits for size. No pericardial effusion. Incompletely imaged calcification of the mitral valve annulus. Circumferential thickening of the distal esophagus is similar compared to prior.  Abdomen/Pelvis: Unremarkable CT appearance of the stomach, duodenum, spleen, adrenal glands, pancreas and liver. Gallbladder is unremarkable. No intra or extrahepatic biliary ductal dilatation. No hydronephrosis. Punctate nonobstructing left nephrolithiasis again noted. No enhancing renal lesion.  Sigmoid colonic diverticulosis. There is extensive wall thickening of the sigmoid colon with surrounding interstitial inflammatory stranding in the pericolonic fat consistent with acute diverticulitis. There is a small amount of a non loculated free fluid extending into the pelvis. No loculation or a free air to suggest perforation or abscess. Sigmoid mesenteric lymphadenopathy and mild left iliac adenopathy are similar compared to prior. Left external iliac station node unchanged at 7 mm. Mildly prominent sigmoid mesenteric nodes measure up to 1 cm which is also unchanged compared to prior.  The bladder is decompressed. Unremarkable prostate gland and seminal vesicles. No evidence of small bowel obstruction.  Bones/Soft  Tissues: No acute fracture or aggressive appearing lytic or blastic osseous lesion.  Vascular: Minimal atherosclerotic vascular calcifications. No aneurysmal dilatation.  IMPRESSION: 1. Acute uncomplicated sigmoid diverticulitis. 2. Stable sigmoid mesocolon and left external iliac lymphadenopathy. The nodes are almost certainly reactive given the near 1 year stability. 3. Nonobstructing left nephrolithiasis. 4. Similar appearance of circumferential wall thickening of the distal esophagus. Query clinical history of gastroesophageal reflux disease and esophagitis?   Electronically Signed   By: Jacqulynn Cadet M.D.   On: 03/15/2014 00:45     EKG Interpretation None      MDM   Final diagnoses:  Sigmoid diverticulitis   Labs as above, leukocytosis of 14.4.  CT revealing acute uncomplicated diverticulitis without perforation or abscess formation. Patient's vital signs are improving after IV fluids but remains tachycardic at rate of 108. Patient will be started on IV cipro and flagyl and admitted for further management.  Discussed with Dr. Arnoldo Morale who will admit to med-surg.  Temp admission orders placed.   Larene Pickett, PA-C 03/15/14 [phone]

## 2014-03-15 NOTE — Progress Notes (Signed)
[facility] PROGRESS NOTE  VINNIE GOMBERT EXB:[id number] DOB: May 16, 1966 DOA: 03/14/2014 PCP: No PCP Per Patient Brief HPI: Alec Kelly is a 48 y.o. male with a history of Diverticulosis, and Gout who presents to the ED with complaints of LLQ ABD Pain, Nausea and Vomiting and loose stools x 3 days. He denies fevers and chills. He denies hematemesis, melena or hematochezia, He was evaluated in the ED an on CT scan, he was found to have Sigmoid Diverticulitis and was placed on IV Cipro and Flagyl and referred for medical admission.   Assessment/Plan:   1. Acute Diverticulitis- IV Cipro and IV Flagyl, IVFs, and Pain Control PRN,and Anti- Emetics PRN.  2. LLQ ABD Pain- Due to #1.  3. Nausea and Vomiting- Anti-Emetics PRN.  4. Leukocytosis- due to #1.  5. Gout- stable   Code Status: FULL CODE  Family Communication: No Family Present  Disposition Plan: Inpatient     Consultants:  none  Procedures:  none  Antibiotics: cipro Flagyl  HPI/Subjective:  abdominal pain improved.   Objective: Filed Vitals:   03/15/14 0601  BP: 109/72  Pulse: 79  Temp: 98.3 F (36.8 C)  Resp: 20    Intake/Output Summary (Last 24 hours) at 03/15/14 1303 Last data filed at 03/15/14 0605  Gross per 24 hour  Intake    840 ml  Output    600 ml  Net    240 ml   Filed Weights   03/15/14 0232  Weight: 126.5 kg (278 lb 14.1 oz)    Exam: Alert afebrile comfortable.  CHEST: Normal respiration, clear to auscultation bilaterally  HEART: Regular rate and rhythm; no murmurs rubs or gallops  BACK: No kyphosis or scoliosis; no CVA tenderness  ABDOMEN: Positive Bowel Sounds,Obese, soft, +LLQ ABD Tenderness, No Guarding, No Rebound Tenderness, No masses, no organomegaly, no intertriginous candida.   Data Reviewed: Basic Metabolic Panel:  Recent Labs Lab 03/14/14 2144 03/15/14 0440  NA 137 137  K 3.9 4.4  CL 99 98  CO2 21 27  GLUCOSE 128* 112*  BUN 17 17  CREATININE 1.25 1.36*   CALCIUM 9.4 8.9   Liver Function Tests:  Recent Labs Lab 03/14/14 2144  AST 16  ALT 22  ALKPHOS 62  BILITOT 0.8  PROT 7.8  ALBUMIN 3.9   No results found for this basename: LIPASE, AMYLASE,  in the last 168 hours No results found for this basename: AMMONIA,  in the last 168 hours CBC:  Recent Labs Lab 03/14/14 2144 03/15/14 0440  WBC 14.4* 13.6*  NEUTROABS 11.0*  --   HGB 16.4 14.2  HCT 46.1 41.9  MCV 88.8 90.7  PLT 262 226   Cardiac Enzymes: No results found for this basename: CKTOTAL, CKMB, CKMBINDEX, TROPONINI,  in the last 168 hours BNP (last 3 results) No results found for this basename: PROBNP,  in the last 8760 hours CBG: No results found for this basename: GLUCAP,  in the last 168 hours  No results found for this or any previous visit (from the past 240 hour(s)).   Studies: Ct Abdomen Pelvis W Contrast  03/15/2014   CLINICAL DATA:  Left lower quadrant pain. History of prior diverticulitis.  EXAM: CT ABDOMEN AND PELVIS WITH CONTRAST  TECHNIQUE: Multidetector CT imaging of the abdomen and pelvis was performed using the standard protocol following bolus administration of intravenous contrast.  CONTRAST:  65mL OMNIPAQUE IOHEXOL 300 MG/ML SOLN, 133mL OMNIPAQUE IOHEXOL 300 MG/ML SOLN  COMPARISON:  Prior CT abdomen/ pelvis  04/06/2013  FINDINGS: Lower Chest: The visualized lung bases are clear. Visualized cardiac structures within normal limits for size. No pericardial effusion. Incompletely imaged calcification of the mitral valve annulus. Circumferential thickening of the distal esophagus is similar compared to prior.  Abdomen/Pelvis: Unremarkable CT appearance of the stomach, duodenum, spleen, adrenal glands, pancreas and liver. Gallbladder is unremarkable. No intra or extrahepatic biliary ductal dilatation. No hydronephrosis. Punctate nonobstructing left nephrolithiasis again noted. No enhancing renal lesion.  Sigmoid colonic diverticulosis. There is extensive wall  thickening of the sigmoid colon with surrounding interstitial inflammatory stranding in the pericolonic fat consistent with acute diverticulitis. There is a small amount of a non loculated free fluid extending into the pelvis. No loculation or a free air to suggest perforation or abscess. Sigmoid mesenteric lymphadenopathy and mild left iliac adenopathy are similar compared to prior. Left external iliac station node unchanged at 7 mm. Mildly prominent sigmoid mesenteric nodes measure up to 1 cm which is also unchanged compared to prior.  The bladder is decompressed. Unremarkable prostate gland and seminal vesicles. No evidence of small bowel obstruction.  Bones/Soft Tissues: No acute fracture or aggressive appearing lytic or blastic osseous lesion.  Vascular: Minimal atherosclerotic vascular calcifications. No aneurysmal dilatation.  IMPRESSION: 1. Acute uncomplicated sigmoid diverticulitis. 2. Stable sigmoid mesocolon and left external iliac lymphadenopathy. The nodes are almost certainly reactive given the near 1 year stability. 3. Nonobstructing left nephrolithiasis. 4. Similar appearance of circumferential wall thickening of the distal esophagus. Query clinical history of gastroesophageal reflux disease and esophagitis?   Electronically Signed   By: Jacqulynn Cadet M.D.   On: 03/15/2014 00:45    Scheduled Meds: . ciprofloxacin  400 mg Intravenous Q12H  . enoxaparin (LOVENOX) injection  60 mg Subcutaneous Daily  . metronidazole  500 mg Intravenous Once  . metronidazole  500 mg Intravenous Q8H   Continuous Infusions: . sodium chloride 100 mL/hr at 03/15/14 1110    Principal Problem:   Acute diverticulitis Active Problems:   Obesity, Class III, BMI 40-49.9 (morbid obesity)   LLQ abdominal pain   Leukocytosis   Gout    Time spent: 35 min    Velton Roselle  [facility] Pager [phone] If 7PM-7AM, please contact night-coverage at [email or web address], password [facility] 03/15/2014, 1:03 PM  LOS:  1 day

## 2014-03-15 NOTE — H&P (Signed)
[facility] History and Physical  Alec Kelly JXB:[id number] DOB: Apr 30, 1966 DOA: 03/14/2014  Referring physician: EDP PCP: No PCP Per Patient  Specialists:   Chief Complaint:  ABD pain  HPI: Alec Kelly is a 48 y.o. male with a history of Diverticulosis, and Gout who presents to the ED with complaints of LLQ ABD Pain, Nausea and Vomiting and loose stools x 3 days. He denies fevers and chills.  He denies hematemesis, melena or hematochezia,   He was evaluated in the ED an on CT scan, he was found to have Sigmoid Diverticulitis and was placed on IV Cipro and Flagyl and referred for medical admission.       Review of Systems:  Constitutional: No Weight Loss, No Weight Gain, Night Sweats, ?Fevers, Chills, Fatigue, or Generalized Weakness HEENT: No Headaches, Difficulty Swallowing,Tooth/Dental Problems,Sore Throat,  No Sneezing, Rhinitis, Ear Ache, Nasal Congestion, or Post Nasal Drip,  Cardio-vascular:  No Chest pain, Orthopnea, PND, Edema in lower extremities, Anasarca, Dizziness, Palpitations  Resp: No Dyspnea, No DOE, No Productive Cough, No Non-Productive Cough, No Hemoptysis, No Change in Color of Mucus,  No Wheezing.    GI: No Heartburn, Indigestion, +LLQ Abdominal Pain, Nausea, Vomiting, Diarrhea, Change in Bowel Habits,  Loss of Appetite  GU: No Dysuria, Change in Color of Urine, No Urgency or Frequency.  No flank pain.  Musculoskeletal: No Joint Pain or Swelling.  No Decreased Range of Motion. No Back Pain.  Neurologic: No Syncope, No Seizures, Muscle Weakness, Paresthesia, Vision Disturbance or Loss, No Diplopia, No Vertigo, No Difficulty Walking,  Skin: No Rash or Lesions. Psych: No Change in Mood or Affect. No Depression or Anxiety. No Memory loss. No Confusion or Hallucinations   Past Medical History  Diagnosis Date  . Kidney stones   . Diverticulitis   . Diverticulitis of sigmoid colon 04/07/2013    With perforation  . Obesity, Class III, BMI 40-49.9 (morbid  obesity) 04/07/2013  . 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 Tonsillectomy    . Appendectomy    . Hernia repair    . Lithotripsy       Prior to Admission medications   Not on File    No Known Allergies   Social History:  reports that he has never smoked. He has never used smokeless tobacco. He reports that he drinks alcohol. He reports that he does not use illicit drugs.     Family History  Problem Relation Age of Onset  . Heart failure Mother   . Diabetes Mother   . Hypertension Mother   . Hyperlipidemia Mother   . COPD Mother   . Cancer Father     lung & prostate  . Hyperlipidemia Father   . Heart disease Father        Physical Exam:  GEN:  Pleasant Morbidly Obese  48 y.o. Caucasian male  examined  and in no acute distress; cooperative with exam Filed Vitals:   03/14/14 2118 03/15/14 0003  BP: 122/77 111/73  Pulse: 122 108  Temp: 99.7 F (37.6 C)   TempSrc: Oral   Resp: 22 14  SpO2: 95% 93%   Blood pressure 111/73, pulse 108, temperature 99.7 F (37.6 C), temperature source Oral, resp. rate 14, SpO2 93.00%. PSYCH: He is alert and oriented x4; does not appear anxious does not appear depressed; affect is normal HEENT: Normocephalic and Atraumatic, Mucous membranes pink; PERRLA; EOM intact; Fundi:  Benign;  No scleral icterus, Nares: Patent, Oropharynx: Clear, Fair Dentition, Neck:  FROM, no cervical lymphadenopathy nor thyromegaly or carotid bruit; no JVD; Breasts:: Not examined CHEST WALL: No tenderness CHEST: Normal respiration, clear to auscultation bilaterally HEART: Regular rate and rhythm; no murmurs rubs or gallops BACK: No kyphosis or scoliosis; no CVA tenderness ABDOMEN: Positive Bowel Sounds,Obese, soft, +LLQ ABD Tenderness, No Guarding, No Rebound Tenderness,   No masses, no organomegaly, no intertriginous candida. Rectal Exam: Not done EXTREMITIES: No cyanosis, clubbing or edema; no  ulcerations. Genitalia: not examined PULSES: 2+ and symmetric SKIN: Normal hydration no rash or ulceration CNS:  Alert and Orieinted x 4, No Focal Deficits Vascular: pulses palpable throughout     Labs on Admission:  Basic Metabolic Panel:  Recent Labs Lab 03/14/14 2144  NA 137  K 3.9  CL 99  CO2 21  GLUCOSE 128*  BUN 17  CREATININE 1.25  CALCIUM 9.4   Liver Function Tests:  Recent Labs Lab 03/14/14 2144  AST 16  ALT 22  ALKPHOS 62  BILITOT 0.8  PROT 7.8  ALBUMIN 3.9   No results found for this basename: LIPASE, AMYLASE,  in the last 168 hours No results found for this basename: AMMONIA,  in the last 168 hours CBC:  Recent Labs Lab 03/14/14 2144  WBC 14.4*  NEUTROABS 11.0*  HGB 16.4  HCT 46.1  MCV 88.8  PLT 262   Cardiac Enzymes: No results found for this basename: CKTOTAL, CKMB, CKMBINDEX, TROPONINI,  in the last 168 hours  BNP (last 3 results) No results found for this basename: PROBNP,  in the last 8760 hours CBG: No results found for this basename: GLUCAP,  in the last 168 hours  Radiological Exams on Admission: Ct Abdomen Pelvis W Contrast  03/15/2014   CLINICAL DATA:  Left lower quadrant pain. History of prior diverticulitis.  EXAM: CT ABDOMEN AND PELVIS WITH CONTRAST  TECHNIQUE: Multidetector CT imaging of the abdomen and pelvis was performed using the standard protocol following bolus administration of intravenous contrast.  CONTRAST:  37mL OMNIPAQUE IOHEXOL 300 MG/ML SOLN, 140mL OMNIPAQUE IOHEXOL 300 MG/ML SOLN  COMPARISON:  Prior CT abdomen/ pelvis 04/06/2013  FINDINGS: Lower Chest: The visualized lung bases are clear. Visualized cardiac structures within normal limits for size. No pericardial effusion. Incompletely imaged calcification of the mitral valve annulus. Circumferential thickening of the distal esophagus is similar compared to prior.  Abdomen/Pelvis: Unremarkable CT appearance of the stomach, duodenum, spleen, adrenal glands, pancreas  and liver. Gallbladder is unremarkable. No intra or extrahepatic biliary ductal dilatation. No hydronephrosis. Punctate nonobstructing left nephrolithiasis again noted. No enhancing renal lesion.  Sigmoid colonic diverticulosis. There is extensive wall thickening of the sigmoid colon with surrounding interstitial inflammatory stranding in the pericolonic fat consistent with acute diverticulitis. There is a small amount of a non loculated free fluid extending into the pelvis. No loculation or a free air to suggest perforation or abscess. Sigmoid mesenteric lymphadenopathy and mild left iliac adenopathy are similar compared to prior. Left external iliac station node unchanged at 7 mm. Mildly prominent sigmoid mesenteric nodes measure up to 1 cm which is also unchanged compared to prior.  The bladder is decompressed. Unremarkable prostate gland and seminal vesicles. No evidence of small bowel obstruction.  Bones/Soft Tissues: No acute fracture or aggressive appearing lytic or blastic osseous lesion.  Vascular: Minimal atherosclerotic vascular calcifications. No aneurysmal dilatation.  IMPRESSION: 1. Acute uncomplicated sigmoid diverticulitis. 2. Stable sigmoid mesocolon and left external iliac lymphadenopathy. The nodes  are almost certainly reactive given the near 1 year stability. 3. Nonobstructing left nephrolithiasis. 4. Similar appearance of circumferential wall thickening of the distal esophagus. Query clinical history of gastroesophageal reflux disease and esophagitis?   Electronically Signed   By: Jacqulynn Cadet M.D.   On: 03/15/2014 00:45      Assessment/Plan:   48 y.o. male with  Principal Problem:   Acute diverticulitis Active Problems:   Obesity, Class III, BMI 40-49.9 (morbid obesity)   LLQ abdominal pain   Leukocytosis   Gout     1.   Acute Diverticulitis-   IV Cipro and IV Flagyl, IVFs, and Pain Control PRN,and Anti- Emetics PRN.     2.   LLQ ABD Pain- Due to #1.    3.   Nausea and  Vomiting-  Anti-Emetics PRN.     4.   Leukocytosis- due to #1.     5.   Gout- stable      Code Status:    FULL CODE Family Communication:    No Family Present Disposition Plan:     Inpatient    Time spent:  [place] [facility] Pager [phone]  If 7PM-7AM, please contact night-coverage [email or web address] Password [email or web address] 03/15/2014, 1:53 AM

## 2014-03-15 NOTE — Progress Notes (Signed)
Rx Brief Lovenox note  Pt with wt=122 kg, BMI=43   Rx adjusted Lovenox to 60mg  daily in pt with BMI>30  Tx, Dorrene German 03/15/2014 2:32 AM

## 2014-03-15 NOTE — ED Notes (Signed)
Patient transported to CT 

## 2014-03-16 ENCOUNTER — Encounter (HOSPITAL_COMMUNITY): Payer: Self-pay | Admitting: General Surgery

## 2014-03-16 DIAGNOSIS — K5732 Diverticulitis of large intestine without perforation or abscess without bleeding: Secondary | ICD-10-CM

## 2014-03-16 DIAGNOSIS — R1032 Left lower quadrant pain: Secondary | ICD-10-CM

## 2014-03-16 DIAGNOSIS — D72829 Elevated white blood cell count, unspecified: Secondary | ICD-10-CM

## 2014-03-16 MED ORDER — IBUPROFEN 400 MG PO TABS
400.0000 mg | ORAL_TABLET | Freq: Three times a day (TID) | ORAL | Status: DC
  Administered 2014-03-16 – 2014-03-20 (×11): 400 mg via ORAL
  Filled 2014-03-16 (×16): qty 1

## 2014-03-16 MED ORDER — PANTOPRAZOLE SODIUM 40 MG PO TBEC
40.0000 mg | DELAYED_RELEASE_TABLET | Freq: Two times a day (BID) | ORAL | Status: DC
  Administered 2014-03-16 – 2014-03-20 (×8): 40 mg via ORAL
  Filled 2014-03-16 (×9): qty 1

## 2014-03-16 MED ORDER — IBUPROFEN 400 MG PO TABS
400.0000 mg | ORAL_TABLET | Freq: Once | ORAL | Status: AC
  Administered 2014-03-16: 400 mg via ORAL
  Filled 2014-03-16: qty 1

## 2014-03-16 MED ORDER — SODIUM CHLORIDE 0.9 % IV SOLN
1.0000 g | Freq: Every day | INTRAVENOUS | Status: DC
  Administered 2014-03-16 – 2014-03-18 (×3): 1 g via INTRAVENOUS
  Filled 2014-03-16 (×4): qty 1

## 2014-03-16 MED ORDER — COLCHICINE 0.6 MG PO TABS
0.6000 mg | ORAL_TABLET | Freq: Two times a day (BID) | ORAL | Status: DC
  Administered 2014-03-16 – 2014-03-17 (×4): 0.6 mg via ORAL
  Filled 2014-03-16 (×7): qty 1

## 2014-03-16 MED ORDER — COLCHICINE 0.6 MG PO TABS
0.6000 mg | ORAL_TABLET | Freq: Once | ORAL | Status: AC
  Administered 2014-03-16: 0.6 mg via ORAL
  Filled 2014-03-16: qty 1

## 2014-03-16 NOTE — Consult Note (Signed)
Alec Kelly 12/09/1966  [id number].    Requesting MD: Dr. Venia Minks Chief Complaint/Reason for Consult: Acute sigmoid diverticulitis  HPI:  48 year old male with PMH significant for kidney stones, diverticulitis, gout who presenting to [facility] for LLQ abdominal pain, onset 03/12/14. Patient states pain started on Sunday afternoon when he was driving back home from out of town. States he noticed some discomfort when hitting bumps in the road. States over the past 2 days the pain has been progressively worsening, described as a sharp, stabbing sensation in his left lower abdomen. He had planned on going out of town, but decided against it since he wasn't feeling well.  He c/o nausea and loose stools but denies vomiting. No hematochezia. Denies fevers, chills, chest pain, SOB, changes in urinary symptoms.  He states he has intermittent trouble with constipation and diarrhea.  The patient is well known to our practice.  His first attack is documented in 09/2012.  He resolved without surgical intervention.  His most recent attack was in  03/2013.  He was treated during that episode as an outpatient with oral antibiotics.  He recurred and had to be admitted to the hospital for several days for IV antibiotics secondary to perforation and abscess.  He followed up with Dr. Blackman and Dr. Toth in the office who recommended a colonoscopy once diverticulitis resolved and eventually sigmoid colon resection.  The patient failed to follow up with GI for the CSP and has not had any diverticulitis since that time until now.  Prior abdominal surgeries include appendectomy, hernia repair, lithotripsy.   He has been managed by medicine for 2 days, but has not had significant improvement.  He was trailed on clear liquids, but had worsening pain.  He currently c/o 7/10 LLQ abdominal pain and nausea.  He originally was having diarrhea, but has had no BM in 2 days.  No radicular symptoms.  IV pain medication seems to  help.  ROS: All systems reviewed and otherwise negative except for as above  Family History  Problem Relation Age of Onset  . Heart failure Mother   . Diabetes Mother   . Hypertension Mother   . Hyperlipidemia Mother   . COPD Mother   . Cancer Father     lung & prostate  . Hyperlipidemia Father   . Heart disease Father     Past Medical History  Diagnosis Date  . Kidney stones   . Diverticulitis   . Diverticulitis of sigmoid colon 04/07/2013    With perforation  . Obesity, Class III, BMI 40-49.9 (morbid obesity) 04/07/2013  . 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 Tonsillectomy    . Appendectomy      13 -61 years old - Dr. Lennie Hummer  . Hernia repair  01/16/4113    umbilical  - At [facility], Dr. Lennie Hummer, Repair of umbilical hernia with mesh.  . Lithotripsy      Social History:  reports that he has never smoked. He has never used smokeless tobacco. He reports that he drinks alcohol. He reports that he does not use illicit drugs.  Allergies: No Known Allergies  No prescriptions prior to admission    Blood pressure 117/78, pulse 74, temperature 98 F (36.7 C), temperature source Oral, resp. rate 20, height 5' 8"  (1.727 m), weight 278 lb 14.1 oz (126.5 kg), SpO2 93.00%. Physical Exam: General: pleasant, WD/WN obese white male who is laying in bed  in NAD HEENT: head is normocephalic, atraumatic.  Sclera are noninjected.  PERRL.  Ears and nose without any masses or lesions.  Mouth is pink and moist Heart: regular, rate, and rhythm.  No obvious murmurs, gallops, or rubs noted.  Palpable pedal pulses bilaterally Lungs: CTAB, no wheezes, rhonchi, or rales noted.  Respiratory effort nonlabored Abd: Obese, soft, quite tender in the lower abdomen >LLQ, but mildly throughout, +BS, no masses, hernias, or organomegaly, RLQ horizontal scar and periumbilical scar well healed MS: all 4 extremities are symmetrical with no cyanosis,  clubbing, or edema. Skin: warm and dry with no masses, lesions, or rashes Psych: A&Ox3 with an appropriate affect.   Results for orders placed during the hospital encounter of 03/14/14 (from the past 48 hour(s))  CBC WITH DIFFERENTIAL     Status: Abnormal   Collection Time    03/14/14  9:44 PM      Result Value Ref Range   WBC 14.4 (*) 4.0 - 10.5 K/uL   RBC 5.19  4.22 - 5.81 MIL/uL   Hemoglobin 16.4  13.0 - 17.0 g/dL   HCT 46.1  39.0 - 52.0 %   MCV 88.8  78.0 - 100.0 fL   MCH 31.6  26.0 - 34.0 pg   MCHC 35.6  30.0 - 36.0 g/dL   RDW 12.6  11.5 - 15.5 %   Platelets 262  150 - 400 K/uL   Neutrophils Relative % 77  43 - 77 %   Neutro Abs 11.0 (*) 1.7 - 7.7 K/uL   Lymphocytes Relative 13  12 - 46 %   Lymphs Abs 1.9  0.7 - 4.0 K/uL   Monocytes Relative 9  3 - 12 %   Monocytes Absolute 1.3 (*) 0.1 - 1.0 K/uL   Eosinophils Relative 1  0 - 5 %   Eosinophils Absolute 0.1  0.0 - 0.7 K/uL   Basophils Relative 0  0 - 1 %   Basophils Absolute 0.0  0.0 - 0.1 K/uL  COMPREHENSIVE METABOLIC PANEL     Status: Abnormal   Collection Time    03/14/14  9:44 PM      Result Value Ref Range   Sodium 137  137 - 147 mEq/L   Potassium 3.9  3.7 - 5.3 mEq/L   Chloride 99  96 - 112 mEq/L   CO2 21  19 - 32 mEq/L   Glucose, Bld 128 (*) 70 - 99 mg/dL   BUN 17  6 - 23 mg/dL   Creatinine, Ser 1.25  0.50 - 1.35 mg/dL   Calcium 9.4  8.4 - 10.5 mg/dL   Total Protein 7.8  6.0 - 8.3 g/dL   Albumin 3.9  3.5 - 5.2 g/dL   AST 16  0 - 37 U/L   ALT 22  0 - 53 U/L   Alkaline Phosphatase 62  39 - 117 U/L   Total Bilirubin 0.8  0.3 - 1.2 mg/dL   GFR calc non Af Amer 67 (*) >90 mL/min   GFR calc Af Amer 78 (*) >90 mL/min   Comment: (NOTE)     The eGFR has been calculated using the CKD EPI equation.     This calculation has not been validated in all clinical situations.     eGFR's persistently <90 mL/min signify possible Chronic Kidney     Disease.  URINALYSIS, ROUTINE W REFLEX MICROSCOPIC     Status: None    Collection Time    03/15/14 12:13 AM  Result Value Ref Range   Color, Urine YELLOW  YELLOW   APPearance CLEAR  CLEAR   Specific Gravity, Urine 1.023  1.005 - 1.030   pH 5.5  5.0 - 8.0   Glucose, UA NEGATIVE  NEGATIVE mg/dL   Hgb urine dipstick NEGATIVE  NEGATIVE   Bilirubin Urine NEGATIVE  NEGATIVE   Ketones, ur NEGATIVE  NEGATIVE mg/dL   Protein, ur NEGATIVE  NEGATIVE mg/dL   Urobilinogen, UA 0.2  0.0 - 1.0 mg/dL   Nitrite NEGATIVE  NEGATIVE   Leukocytes, UA NEGATIVE  NEGATIVE   Comment: MICROSCOPIC NOT DONE ON URINES WITH NEGATIVE PROTEIN, BLOOD, LEUKOCYTES, NITRITE, OR GLUCOSE <1000 mg/dL.  BASIC METABOLIC PANEL     Status: Abnormal   Collection Time    03/15/14  4:40 AM      Result Value Ref Range   Sodium 137  137 - 147 mEq/L   Potassium 4.4  3.7 - 5.3 mEq/L   Chloride 98  96 - 112 mEq/L   CO2 27  19 - 32 mEq/L   Glucose, Bld 112 (*) 70 - 99 mg/dL   BUN 17  6 - 23 mg/dL   Creatinine, Ser 1.36 (*) 0.50 - 1.35 mg/dL   Calcium 8.9  8.4 - 10.5 mg/dL   GFR calc non Af Amer 61 (*) >90 mL/min   GFR calc Af Amer 70 (*) >90 mL/min   Comment: (NOTE)     The eGFR has been calculated using the CKD EPI equation.     This calculation has not been validated in all clinical situations.     eGFR's persistently <90 mL/min signify possible Chronic Kidney     Disease.  CBC     Status: Abnormal   Collection Time    03/15/14  4:40 AM      Result Value Ref Range   WBC 13.6 (*) 4.0 - 10.5 K/uL   RBC 4.62  4.22 - 5.81 MIL/uL   Hemoglobin 14.2  13.0 - 17.0 g/dL   HCT 41.9  39.0 - 52.0 %   MCV 90.7  78.0 - 100.0 fL   MCH 30.7  26.0 - 34.0 pg   MCHC 33.9  30.0 - 36.0 g/dL   RDW 12.7  11.5 - 15.5 %   Platelets 226  150 - 400 K/uL   Ct Abdomen Pelvis W Contrast  03/15/2014   CLINICAL DATA:  Left lower quadrant pain. History of prior diverticulitis.  EXAM: CT ABDOMEN AND PELVIS WITH CONTRAST  TECHNIQUE: Multidetector CT imaging of the abdomen and pelvis was performed using the standard  protocol following bolus administration of intravenous contrast.  CONTRAST:  60m OMNIPAQUE IOHEXOL 300 MG/ML SOLN, 1010mOMNIPAQUE IOHEXOL 300 MG/ML SOLN  COMPARISON:  Prior CT abdomen/ pelvis 04/06/2013  FINDINGS: Lower Chest: The visualized lung bases are clear. Visualized cardiac structures within normal limits for size. No pericardial effusion. Incompletely imaged calcification of the mitral valve annulus. Circumferential thickening of the distal esophagus is similar compared to prior.  Abdomen/Pelvis: Unremarkable CT appearance of the stomach, duodenum, spleen, adrenal glands, pancreas and liver. Gallbladder is unremarkable. No intra or extrahepatic biliary ductal dilatation. No hydronephrosis. Punctate nonobstructing left nephrolithiasis again noted. No enhancing renal lesion.  Sigmoid colonic diverticulosis. There is extensive wall thickening of the sigmoid colon with surrounding interstitial inflammatory stranding in the pericolonic fat consistent with acute diverticulitis. There is a small amount of a non loculated free fluid extending into the pelvis. No loculation or a free air to suggest  perforation or abscess. Sigmoid mesenteric lymphadenopathy and mild left iliac adenopathy are similar compared to prior. Left external iliac station node unchanged at 7 mm. Mildly prominent sigmoid mesenteric nodes measure up to 1 cm which is also unchanged compared to prior.  The bladder is decompressed. Unremarkable prostate gland and seminal vesicles. No evidence of small bowel obstruction.  Bones/Soft Tissues: No acute fracture or aggressive appearing lytic or blastic osseous lesion.  Vascular: Minimal atherosclerotic vascular calcifications. No aneurysmal dilatation.  IMPRESSION: 1. Acute uncomplicated sigmoid diverticulitis. 2. Stable sigmoid mesocolon and left external iliac lymphadenopathy. The nodes are almost certainly reactive given the near 1 year stability. 3. Nonobstructing left nephrolithiasis. 4. Similar  appearance of circumferential wall thickening of the distal esophagus. Query clinical history of gastroesophageal reflux disease and esophagitis?   Electronically Signed   By: Jacqulynn Cadet M.D.   On: 03/15/2014 00:45      Assessment/Plan Uncomplicated recurrent sigmoid diverticulitis Esophagitis H/o sigmoid diverticulitis with perforation - 03/2014 Leukocytosis Morbid obesity - Body mass index is 42.41 kg/(m^2). Gout with current flair - per medicine  Plan: 1.  He had diverticulitis last one 03/2013, also in 09/2012.  He was non-compliant with follow up per Dr. Earl Lagos. Toth's instructions to obtain a colonoscopy and recommendation for elective surgical resection. 2.  Hopefully can treat this conservatively including:  NPO until pain much improved, bowel rest, IVF, pain control, antiemetics, antibiotics (Cipro/flagyl for now, but may have to change to zosyn or invanz if not improved by tomorrow) 3.  SCD's and lovenox for DVT proph 4.  Ambulate and IS 5.  If acutely worsens then would need Hartmann's procedure with colostomy which the patient wants to avoid. 6.  Start Protonix for esophagitis 7.  Patient c/o his gout being very painful.  Will leave this to Dr. Karleen Hampshire.   Coralie Keens, [facility] 03/16/2014, 1:32 PM Pager: [phone]

## 2014-03-16 NOTE — ED Provider Notes (Signed)
Medical screening examination/treatment/procedure(s) were performed by non-physician practitioner and as supervising physician I was immediately available for consultation/collaboration.   EKG Interpretation None       Jasper Riling. Alvino Chapel, MD 03/16/14 [phone]

## 2014-03-16 NOTE — Consult Note (Signed)
General surgery note:  I've interviewed and examined this patient and reviewed his past history, current hospital progress, and imaging studies. I agree with the assessment and treatment plan outlined by Ms. Dort, PA  Initially, his CT scan shows recurrent sigmoid diverticulitis but without any evidence of obstruction, abscess, or perforation. Despite bowel rest and antibiotics she continues to have pain and he states that he has not passed any gas or flatus. No nausea or vomiting, however. Interestingly, he also reports some intermittent left shoulder pain.  On exam he is a large man. Abdomen is soft but he is tender in the left lower quadrant and suprapubic area and to a lesser extent in the right lower quadrant. He has excellent bowel sounds.   Not distended.Right lower quadrant scar from remote appendectomy.    Assessment/plan:  Recurrent sigmoid diverticulitis. Not clinically responding as expected to bowel rest and IV antibiotics. Continue n.p.o. Except ice chips Switch antibiotics Invanz Repeat CT scan with contrast tomorrow  Esophagitis  by CT scan. We have taken the liberty of starting him on double dose proton pump inhibitors.  Morbid obesity, BMI 42.4  gout with current flare, being treated by medicine  History sigmoid diverticulitis with perforation abscess, resolved following percutaneous drainage  Edsel Petrin. Dalbert Batman, M.D., [facility] General and Minimally invasive Surgery Breast and Colorectal Surgery Office:   [phone] Pager:   [phone]

## 2014-03-16 NOTE — Progress Notes (Signed)
MD, Patient reports having a gout flare up this am. [facility] notified and a one time order of colchicine and ibuprofen given.  Patient unsure of the name of the gout medication he has taken at home but reports it is a small purple pill.  Please follow up with patient regarding this.  Thank  You, Alec Kelly

## 2014-03-16 NOTE — Progress Notes (Signed)
[facility] PROGRESS NOTE  HYMIE GORR ZJI:[id number] DOB: 04-Nov-1966 DOA: 03/14/2014 PCP: No PCP Per Patient Brief HPI: Alec Kelly is a 48 y.o. male with a history of Diverticulosis, and Gout who presents to the ED with complaints of LLQ ABD Pain, Nausea and Vomiting and loose stools x 3 days. He denies fevers and chills. He denies hematemesis, melena or hematochezia, He was evaluated in the ED an on CT scan, he was found to have Sigmoid Diverticulitis and was placed on IV Cipro and Flagyl and referred for medical admission.   Assessment/Plan:   1. Acute Diverticulitis- IV Cipro and IV Flagyl, IVFs, and Pain Control PRN,and Anti- Emetics PRN. NPO, . Surgery consulted appreciate recommendations.  2. LLQ ABD Pain- Due to #1.  3. Nausea and Vomiting- Anti-Emetics PRN.  4. Leukocytosis- due to #1.  5. Gout flare up: started on colchicine and ibuprofen DVT prophylaxis.    Code Status: FULL CODE  Family Communication: No Family Present  Disposition Plan: Inpatient     Consultants: surgery Procedures:  none  Antibiotics: cipro Flagyl  HPI/Subjective:  abdominal pain improved. Reports gouty flare up.   Objective: Filed Vitals:   03/16/14 0640  BP: 117/78  Pulse: 74  Temp: 98 F (36.7 C)  Resp: 20    Intake/Output Summary (Last 24 hours) at 03/16/14 1452 Last data filed at 03/16/14 1229  Gross per 24 hour  Intake 2793.33 ml  Output   2920 ml  Net -126.67 ml   Filed Weights   03/15/14 0232  Weight: 126.5 kg (278 lb 14.1 oz)    Exam: Alert afebrile comfortable.  CHEST: Normal respiration, clear to auscultation bilaterally  HEART: Regular rate and rhythm; no murmurs rubs or gallops  BACK: No kyphosis or scoliosis; no CVA tenderness  ABDOMEN: Positive Bowel Sounds,Obese, soft, +LLQ ABD Tenderness, No Guarding, No Rebound Tenderness, No masses, no organomegaly, no intertriginous candida.   Data Reviewed: Basic Metabolic Panel:  Recent Labs Lab  03/14/14 2144 03/15/14 0440  NA 137 137  K 3.9 4.4  CL 99 98  CO2 21 27  GLUCOSE 128* 112*  BUN 17 17  CREATININE 1.25 1.36*  CALCIUM 9.4 8.9   Liver Function Tests:  Recent Labs Lab 03/14/14 2144  AST 16  ALT 22  ALKPHOS 62  BILITOT 0.8  PROT 7.8  ALBUMIN 3.9   No results found for this basename: LIPASE, AMYLASE,  in the last 168 hours No results found for this basename: AMMONIA,  in the last 168 hours CBC:  Recent Labs Lab 03/14/14 2144 03/15/14 0440  WBC 14.4* 13.6*  NEUTROABS 11.0*  --   HGB 16.4 14.2  HCT 46.1 41.9  MCV 88.8 90.7  PLT 262 226   Cardiac Enzymes: No results found for this basename: CKTOTAL, CKMB, CKMBINDEX, TROPONINI,  in the last 168 hours BNP (last 3 results) No results found for this basename: PROBNP,  in the last 8760 hours CBG: No results found for this basename: GLUCAP,  in the last 168 hours  No results found for this or any previous visit (from the past 240 hour(s)).   Studies: Ct Abdomen Pelvis W Contrast  03/15/2014   CLINICAL DATA:  Left lower quadrant pain. History of prior diverticulitis.  EXAM: CT ABDOMEN AND PELVIS WITH CONTRAST  TECHNIQUE: Multidetector CT imaging of the abdomen and pelvis was performed using the standard protocol following bolus administration of intravenous contrast.  CONTRAST:  19mL OMNIPAQUE IOHEXOL 300 MG/ML SOLN, 117mL OMNIPAQUE IOHEXOL  300 MG/ML SOLN  COMPARISON:  Prior CT abdomen/ pelvis 04/06/2013  FINDINGS: Lower Chest: The visualized lung bases are clear. Visualized cardiac structures within normal limits for size. No pericardial effusion. Incompletely imaged calcification of the mitral valve annulus. Circumferential thickening of the distal esophagus is similar compared to prior.  Abdomen/Pelvis: Unremarkable CT appearance of the stomach, duodenum, spleen, adrenal glands, pancreas and liver. Gallbladder is unremarkable. No intra or extrahepatic biliary ductal dilatation. No hydronephrosis. Punctate  nonobstructing left nephrolithiasis again noted. No enhancing renal lesion.  Sigmoid colonic diverticulosis. There is extensive wall thickening of the sigmoid colon with surrounding interstitial inflammatory stranding in the pericolonic fat consistent with acute diverticulitis. There is a small amount of a non loculated free fluid extending into the pelvis. No loculation or a free air to suggest perforation or abscess. Sigmoid mesenteric lymphadenopathy and mild left iliac adenopathy are similar compared to prior. Left external iliac station node unchanged at 7 mm. Mildly prominent sigmoid mesenteric nodes measure up to 1 cm which is also unchanged compared to prior.  The bladder is decompressed. Unremarkable prostate gland and seminal vesicles. No evidence of small bowel obstruction.  Bones/Soft Tissues: No acute fracture or aggressive appearing lytic or blastic osseous lesion.  Vascular: Minimal atherosclerotic vascular calcifications. No aneurysmal dilatation.  IMPRESSION: 1. Acute uncomplicated sigmoid diverticulitis. 2. Stable sigmoid mesocolon and left external iliac lymphadenopathy. The nodes are almost certainly reactive given the near 1 year stability. 3. Nonobstructing left nephrolithiasis. 4. Similar appearance of circumferential wall thickening of the distal esophagus. Query clinical history of gastroesophageal reflux disease and esophagitis?   Electronically Signed   By: Jacqulynn Cadet M.D.   On: 03/15/2014 00:45    Scheduled Meds: . ciprofloxacin  400 mg Intravenous Q12H  . colchicine  0.6 mg Oral BID  . enoxaparin (LOVENOX) injection  60 mg Subcutaneous Daily  . ibuprofen  400 mg Oral TID  . metronidazole  500 mg Intravenous Once  . metronidazole  500 mg Intravenous Q8H   Continuous Infusions: . sodium chloride 100 mL/hr at 03/16/14 4944    Principal Problem:   Acute diverticulitis Active Problems:   Obesity, Class III, BMI 40-49.9 (morbid obesity)   LLQ abdominal pain    Leukocytosis   Gout    Time spent: 35 min    Erlean Mealor  [facility] Pager [phone] If 7PM-7AM, please contact night-coverage at [email or web address], password [facility] 03/16/2014, 2:52 PM  LOS: 2 days

## 2014-03-17 ENCOUNTER — Encounter (HOSPITAL_COMMUNITY): Payer: Self-pay | Admitting: Radiology

## 2014-03-17 ENCOUNTER — Inpatient Hospital Stay (HOSPITAL_COMMUNITY): Payer: Self-pay

## 2014-03-17 LAB — CBC
HEMATOCRIT: 39.4 % (ref 39.0–52.0)
HEMOGLOBIN: 13.4 g/dL (ref 13.0–17.0)
MCH: 30.7 pg (ref 26.0–34.0)
MCHC: 34 g/dL (ref 30.0–36.0)
MCV: 90.2 fL (ref 78.0–100.0)
Platelets: 181 10*3/uL (ref 150–400)
RBC: 4.37 MIL/uL (ref 4.22–5.81)
RDW: 12.3 % (ref 11.5–15.5)
WBC: 6.4 10*3/uL (ref 4.0–10.5)

## 2014-03-17 LAB — BASIC METABOLIC PANEL
BUN: 12 mg/dL (ref 6–23)
CHLORIDE: 101 meq/L (ref 96–112)
CO2: 29 mEq/L (ref 19–32)
Calcium: 8.6 mg/dL (ref 8.4–10.5)
Creatinine, Ser: 1.21 mg/dL (ref 0.50–1.35)
GFR calc Af Amer: 81 mL/min — ABNORMAL LOW (ref >90)
GFR, EST NON AFRICAN AMERICAN: 70 mL/min — AB (ref >90)
Glucose, Bld: 93 mg/dL (ref 70–99)
POTASSIUM: 4.4 meq/L (ref 3.7–5.3)
SODIUM: 139 meq/L (ref 137–147)

## 2014-03-17 MED ORDER — IOHEXOL 300 MG/ML  SOLN
100.0000 mL | Freq: Once | INTRAMUSCULAR | Status: AC | PRN
  Administered 2014-03-17: 100 mL via INTRAVENOUS

## 2014-03-17 MED ORDER — IOHEXOL 300 MG/ML  SOLN: 25.0000 mL | INTRAMUSCULAR | Status: AC

## 2014-03-17 NOTE — Progress Notes (Signed)
Patient ID: Alec Kelly, male   DOB: 1966/09/12, 48 y.o.   MRN: [id number] [facility] Surgery Progress Note:   * No surgery found *  Subjective: Mental status is clear.  History reviewed.   Objective: Vital signs in last 24 hours: Temp:  [97.6 F (36.4 C)-98.1 F (36.7 C)] 97.6 F (36.4 C) (04/03 0547) Pulse Rate:  [60-63] 60 (04/03 0547) Resp:  [16-20] 16 (04/02 2140) BP: (109-125)/(66-82) 116/72 mmHg (04/03 0547) SpO2:  [94 %-97 %] 94 % (04/03 0547)  Intake/Output from previous day: 04/02 0701 - 04/03 0700 In: 2673.3 [I.V.:2623.3; IV Piggyback:50] Out: 2875 [Urine:2875] Intake/Output this shift:    Physical Exam: Work of breathing is not labored.  Abdomen tenderness is slightly less  Lab Results:  Results for orders placed during the hospital encounter of 03/14/14 (from the past 48 hour(s))  BASIC METABOLIC PANEL     Status: Abnormal   Collection Time    03/17/14  3:52 AM      Result Value Ref Range   Sodium 139  137 - 147 mEq/L   Potassium 4.4  3.7 - 5.3 mEq/L   Chloride 101  96 - 112 mEq/L   CO2 29  19 - 32 mEq/L   Glucose, Bld 93  70 - 99 mg/dL   BUN 12  6 - 23 mg/dL   Creatinine, Ser 1.21  0.50 - 1.35 mg/dL   Calcium 8.6  8.4 - 10.5 mg/dL   GFR calc non Af Amer 70 (*) >90 mL/min   GFR calc Af Amer 81 (*) >90 mL/min   Comment: (NOTE)     The eGFR has been calculated using the CKD EPI equation.     This calculation has not been validated in all clinical situations.     eGFR's persistently <90 mL/min signify possible Chronic Kidney     Disease.  CBC     Status: None   Collection Time    03/17/14  3:52 AM      Result Value Ref Range   WBC 6.4  4.0 - 10.5 K/uL   RBC 4.37  4.22 - 5.81 MIL/uL   Hemoglobin 13.4  13.0 - 17.0 g/dL   HCT 39.4  39.0 - 52.0 %   MCV 90.2  78.0 - 100.0 fL   MCH 30.7  26.0 - 34.0 pg   MCHC 34.0  30.0 - 36.0 g/dL   RDW 12.3  11.5 - 15.5 %   Platelets 181  150 - 400 K/uL    Radiology/Results: No results  found.  Anti-infectives: Anti-infectives   Start     Dose/Rate Route Frequency Ordered Stop   03/16/14 1700  ertapenem (INVANZ) 1 g in sodium chloride 0.9 % 50 mL IVPB     1 g 100 mL/hr over 30 Minutes Intravenous Daily 03/16/14 1626     03/15/14 1000  ciprofloxacin (CIPRO) IVPB 400 mg  Status:  Discontinued     400 mg 200 mL/hr over 60 Minutes Intravenous Every 12 hours 03/15/14 0224 03/16/14 1626   03/15/14 0400  metroNIDAZOLE (FLAGYL) IVPB 500 mg  Status:  Discontinued     500 mg 100 mL/hr over 60 Minutes Intravenous Every 8 hours 03/15/14 0224 03/16/14 1626   03/15/14 0100  ciprofloxacin (CIPRO) IVPB 400 mg     400 mg 200 mL/hr over 60 Minutes Intravenous  Once 03/15/14 0057 03/15/14 0224   03/15/14 0100  metroNIDAZOLE (FLAGYL) IVPB 500 mg     500 mg 100 mL/hr over 60  Minutes Intravenous  Once 03/15/14 0057        Assessment/Plan: Problem List: Patient Active Problem List   Diagnosis Date Noted  . Sigmoid diverticulitis 03/15/2014  . Acute diverticulitis 03/15/2014  . LLQ abdominal pain 03/15/2014  . Leukocytosis 03/15/2014  . Gout 03/15/2014  . Blood in stool 05/13/2013  . Diverticulitis of colon with perforation 04/07/2013  . Obesity, Class III, BMI 40-49.9 (morbid obesity) 04/07/2013  . Esophageal thickening 04/07/2013  . Idiopathic gout of knee 04/06/2013    CT report pending.  WBC down and clinically improved.  Clears started PO.   * No surgery found *    LOS: 3 days   Matt B. Hassell Done, MD, [facility] [phone] beeper [phone]  03/17/2014 10:57 AM

## 2014-03-17 NOTE — Progress Notes (Signed)
[facility] PROGRESS NOTE  Alec Kelly NLZ:[id number] DOB: [birth year] DOA: 03/14/2014 PCP: No PCP Per Patient Brief HPI: Alec Kelly is a 48 y.o. male with a history of Diverticulosis, and Gout who presents to the ED with complaints of LLQ ABD Pain, Nausea and Vomiting and loose stools x 3 days. He denies fevers and chills. He denies hematemesis, melena or hematochezia, He was evaluated in the ED an on CT scan, he was found to have Sigmoid Diverticulitis and was placed on IV Cipro and Flagyl and referred for medical admission.   Assessment/Plan:   1. Acute Diverticulitis- IV Cipro and IV Flagyl, IVFs, and Pain Control PRN,and Anti- Emetics PRN. REpeat CT shows persistent diverticulitis but no abscess orperforation. He was started on clears today. , . Surgery consulted appreciate recommendations.  2. LLQ ABD Pain- Due to #1.  3. Nausea and Vomiting- Anti-Emetics PRN.  4. Leukocytosis- due to #1.  5. Gout flare up: started on colchicine and ibuprofen DVT prophylaxis.    Code Status: FULL CODE  Family Communication: No Family Present  Disposition Plan: Inpatient     Consultants: surgery Procedures:  none  Antibiotics: cipro Flagyl  HPI/Subjective:  abdominal pain improved. Reports gouty flare up.   Objective: Filed Vitals:   03/17/14 1500  BP: 128/87  Pulse: 60  Temp: 97.9 F (36.6 C)  Resp: 18    Intake/Output Summary (Last 24 hours) at 03/17/14 1739 Last data filed at 03/17/14 1637  Gross per 24 hour  Intake 4001.67 ml  Output   2676 ml  Net 1325.67 ml   Filed Weights   03/15/14 0232  Weight: 126.5 kg (278 lb 14.1 oz)    Exam: Alert afebrile comfortable.  CHEST: Normal respiration, clear to auscultation bilaterally  HEART: Regular rate and rhythm; no murmurs rubs or gallops  BACK: No kyphosis or scoliosis; no CVA tenderness  ABDOMEN: Positive Bowel Sounds,Obese, soft, +LLQ ABD Tenderness, No Guarding, No Rebound Tenderness, No masses, no  organomegaly, no intertriginous candida.   Data Reviewed: Basic Metabolic Panel:  Recent Labs Lab 03/14/14 2144 03/15/14 0440 03/17/14 0352  NA 137 137 139  K 3.9 4.4 4.4  CL 99 98 101  CO2 21 27 29   GLUCOSE 128* 112* 93  BUN 17 17 12   CREATININE 1.25 1.36* 1.21  CALCIUM 9.4 8.9 8.6   Liver Function Tests:  Recent Labs Lab 03/14/14 2144  AST 16  ALT 22  ALKPHOS 62  BILITOT 0.8  PROT 7.8  ALBUMIN 3.9   No results found for this basename: LIPASE, AMYLASE,  in the last 168 hours No results found for this basename: AMMONIA,  in the last 168 hours CBC:  Recent Labs Lab 03/14/14 2144 03/15/14 0440 03/17/14 0352  WBC 14.4* 13.6* 6.4  NEUTROABS 11.0*  --   --   HGB 16.4 14.2 13.4  HCT 46.1 41.9 39.4  MCV 88.8 90.7 90.2  PLT 262 226 181   Cardiac Enzymes: No results found for this basename: CKTOTAL, CKMB, CKMBINDEX, TROPONINI,  in the last 168 hours BNP (last 3 results) No results found for this basename: PROBNP,  in the last 8760 hours CBG: No results found for this basename: GLUCAP,  in the last 168 hours  No results found for this or any previous visit (from the past 240 hour(s)).   Studies: Ct Abdomen Pelvis W Contrast  03/17/2014   CLINICAL DATA:  Low abdominal pain with nausea.  Diverticulitis.  EXAM: CT ABDOMEN AND PELVIS WITH CONTRAST  TECHNIQUE: Multidetector CT imaging of the abdomen and pelvis was performed using the standard protocol following bolus administration of intravenous contrast.  CONTRAST:  130mL OMNIPAQUE IOHEXOL 300 MG/ML  SOLN  COMPARISON:  CT ABD/PELVIS W CM dated 03/15/2014; CT ABD/PELVIS W CM dated 04/06/2013  FINDINGS: There is mildly increased atelectasis at both lung bases. There is no significant pleural or pericardial effusion. There are probable aortic valvular calcifications. Distal esophageal wall thickening is again noted.  The liver, spleen, gallbladder, pancreas and adrenal glands remain unremarkable in appearance. There is a  nonobstructing calculus in the upper pole of the left kidney. No renal mass or hydronephrosis is demonstrated.  Changes related to acute sigmoid diverticulitis have not significantly changed from yesterday. There is bowel wall thickening, surrounding inflammatory change, minimal extraluminal fluid and adjacent reactive lymphadenopathy. No drainable fluid collection or free intraperitoneal air is seen There is no evidence of bowel obstruction.  The bladder, prostate gland and seminal vesicles appear unremarkable. There are no worrisome osseous findings. Next  IMPRESSION: 1. Stable findings of acute sigmoid diverticulitis. No evidence of perforation, abscess or bowel obstruction. 2. Stable distal esophageal wall thickening and probable reactive mesenteric lymphadenopathy. 3. Suspected aortic valvular calcifications.   Electronically Signed   By: Camie Patience M.D.   On: 03/17/2014 10:56    Scheduled Meds: . colchicine  0.6 mg Oral BID  . enoxaparin (LOVENOX) injection  60 mg Subcutaneous Daily  . ertapenem  1 g Intravenous Daily  . ibuprofen  400 mg Oral TID  . metronidazole  500 mg Intravenous Once  . pantoprazole  40 mg Oral BID   Continuous Infusions: . sodium chloride 1,000 mL (03/17/14 1213)    Principal Problem:   Acute diverticulitis Active Problems:   Obesity, Class III, BMI 40-49.9 (morbid obesity)   LLQ abdominal pain   Leukocytosis   Gout    Time spent: 35 min    Alec Kelly  [facility] Pager [phone] If 7PM-7AM, please contact night-coverage at [email or web address], password [facility] 03/17/2014, 5:39 PM  LOS: 3 days

## 2014-03-18 DIAGNOSIS — M109 Gout, unspecified: Secondary | ICD-10-CM

## 2014-03-18 NOTE — Progress Notes (Signed)
[facility] PROGRESS NOTE  Alec Kelly:[id number] DOB: [birth year] DOA: 03/14/2014 PCP: No PCP Per Patient Brief HPI: Alec Kelly is a 48 y.o. male with a history of Diverticulosis, and Gout who presents to the ED with complaints of LLQ ABD Pain, Nausea and Vomiting and loose stools x 3 days. He denies fevers and chills. He denies hematemesis, melena or hematochezia, He was evaluated in the ED an on CT scan, he was found to have Sigmoid Diverticulitis and was placed on IV Cipro and Flagyl and referred for medical admission.   Assessment/Plan:   1. Acute Diverticulitis-patient completed 2 days of IV cipro and flagyl and antibiotics were changed to invanz by surgery.  IVFs, and Pain Control PRN,and Anti- Emetics PRN. REpeat CT shows persistent diverticulitis but no abscess orperforation. He was started on clears on 4/3 and advanced as tolerated. , . Surgery consulted appreciate recommendations.  2. LLQ ABD Pain- Due to #1.  3. Nausea and Vomiting- Anti-Emetics PRN.  4. Leukocytosis- due to #1.  5. Gout flare up: started on colchicine and ibuprofen DVT prophylaxis.    Code Status: FULL CODE  Family Communication: No Family Present  Disposition Plan: Inpatient     Consultants: surgery Procedures:  none  Antibiotics: cipro Flagyl invanz 4/3  HPI/Subjective:  abdominal pain improved.j toe pain improved .   Objective: Filed Vitals:   03/18/14 0548  BP: 132/82  Pulse: 58  Temp: 98.2 F (36.8 C)  Resp: 18    Intake/Output Summary (Last 24 hours) at 03/18/14 1336 Last data filed at 03/18/14 0500  Gross per 24 hour  Intake 2048.33 ml  Output   1301 ml  Net 747.33 ml   Filed Weights   03/15/14 0232  Weight: 126.5 kg (278 lb 14.1 oz)    Exam: Alert afebrile comfortable.  CHEST: Normal respiration, clear to auscultation bilaterally  HEART: Regular rate and rhythm; no murmurs rubs or gallops  BACK: No kyphosis or scoliosis; no CVA tenderness   ABDOMEN: Positive Bowel Sounds,Obese, soft, +LLQ ABD Tenderness improved, No Guarding, No Rebound Tenderness, No masses, no organomegaly, no intertriginous candida.   Data Reviewed: Basic Metabolic Panel:  Recent Labs Lab 03/14/14 2144 03/15/14 0440 03/17/14 0352  NA 137 137 139  K 3.9 4.4 4.4  CL 99 98 101  CO2 21 27 29   GLUCOSE 128* 112* 93  BUN 17 17 12   CREATININE 1.25 1.36* 1.21  CALCIUM 9.4 8.9 8.6   Liver Function Tests:  Recent Labs Lab 03/14/14 2144  AST 16  ALT 22  ALKPHOS 62  BILITOT 0.8  PROT 7.8  ALBUMIN 3.9   No results found for this basename: LIPASE, AMYLASE,  in the last 168 hours No results found for this basename: AMMONIA,  in the last 168 hours CBC:  Recent Labs Lab 03/14/14 2144 03/15/14 0440 03/17/14 0352  WBC 14.4* 13.6* 6.4  NEUTROABS 11.0*  --   --   HGB 16.4 14.2 13.4  HCT 46.1 41.9 39.4  MCV 88.8 90.7 90.2  PLT 262 226 181   Cardiac Enzymes: No results found for this basename: CKTOTAL, CKMB, CKMBINDEX, TROPONINI,  in the last 168 hours BNP (last 3 results) No results found for this basename: PROBNP,  in the last 8760 hours CBG: No results found for this basename: GLUCAP,  in the last 168 hours  No results found for this or any previous visit (from the past 240 hour(s)).   Studies: Ct Abdomen Pelvis W Contrast  03/17/2014  CLINICAL DATA:  Low abdominal pain with nausea.  Diverticulitis.  EXAM: CT ABDOMEN AND PELVIS WITH CONTRAST  TECHNIQUE: Multidetector CT imaging of the abdomen and pelvis was performed using the standard protocol following bolus administration of intravenous contrast.  CONTRAST:  132mL OMNIPAQUE IOHEXOL 300 MG/ML  SOLN  COMPARISON:  CT ABD/PELVIS W CM dated 03/15/2014; CT ABD/PELVIS W CM dated 04/06/2013  FINDINGS: There is mildly increased atelectasis at both lung bases. There is no significant pleural or pericardial effusion. There are probable aortic valvular calcifications. Distal esophageal wall thickening is  again noted.  The liver, spleen, gallbladder, pancreas and adrenal glands remain unremarkable in appearance. There is a nonobstructing calculus in the upper pole of the left kidney. No renal mass or hydronephrosis is demonstrated.  Changes related to acute sigmoid diverticulitis have not significantly changed from yesterday. There is bowel wall thickening, surrounding inflammatory change, minimal extraluminal fluid and adjacent reactive lymphadenopathy. No drainable fluid collection or free intraperitoneal air is seen There is no evidence of bowel obstruction.  The bladder, prostate gland and seminal vesicles appear unremarkable. There are no worrisome osseous findings. Next  IMPRESSION: 1. Stable findings of acute sigmoid diverticulitis. No evidence of perforation, abscess or bowel obstruction. 2. Stable distal esophageal wall thickening and probable reactive mesenteric lymphadenopathy. 3. Suspected aortic valvular calcifications.   Electronically Signed   By: Camie Patience M.D.   On: 03/17/2014 10:56    Scheduled Meds: . colchicine  0.6 mg Oral BID  . enoxaparin (LOVENOX) injection  60 mg Subcutaneous Daily  . ertapenem  1 g Intravenous Daily  . ibuprofen  400 mg Oral TID  . metronidazole  500 mg Intravenous Once  . pantoprazole  40 mg Oral BID   Continuous Infusions: . sodium chloride 100 mL/hr at 03/18/14 0930    Principal Problem:   Acute diverticulitis Active Problems:   Obesity, Class III, BMI 40-49.9 (morbid obesity)   LLQ abdominal pain   Leukocytosis   Gout    Time spent: 35 min    [name]  [facility] Pager [phone] If 7PM-7AM, please contact night-coverage at [email or web address], password [facility] 03/18/2014, 1:36 PM  LOS: 4 days

## 2014-03-18 NOTE — Progress Notes (Signed)
Patient ID: Alec Kelly, male   DOB: Dec 30, 1965, 48 y.o.   MRN: [id number] [facility] Progress Note:   * No surgery found *  Subjective: Mental status is clear Objective: Vital signs in last 24 hours: Temp:  [97.6 F (36.4 C)-98.2 F (36.8 C)] 98.2 F (36.8 C) (04/04 0548) Pulse Rate:  [58-97] 58 (04/04 0548) Resp:  [18] 18 (04/04 0548) BP: (119-132)/(81-94) 132/82 mmHg (04/04 0548) SpO2:  [94 %-99 %] 94 % (04/04 0548)  Intake/Output from previous day: 04/03 0701 - 04/04 0700 In: 2048.3 [P.O.:1260; I.V.:788.3] Out: 1301 [Urine:1300; Stool:1] Intake/Output this shift:    Physical Exam: Work of breathing is not labored.  Abdomen is less tender.   Lab Results:  Results for orders placed during the hospital encounter of 03/14/14 (from the past 48 hour(s))  BASIC METABOLIC PANEL     Status: Abnormal   Collection Time    03/17/14  3:52 AM      Result Value Ref Range   Sodium 139  137 - 147 mEq/L   Potassium 4.4  3.7 - 5.3 mEq/L   Chloride 101  96 - 112 mEq/L   CO2 29  19 - 32 mEq/L   Glucose, Bld 93  70 - 99 mg/dL   BUN 12  6 - 23 mg/dL   Creatinine, Ser 1.21  0.50 - 1.35 mg/dL   Calcium 8.6  8.4 - 10.5 mg/dL   GFR calc non Af Amer 70 (*) >90 mL/min   GFR calc Af Amer 81 (*) >90 mL/min   Comment: (NOTE)     The eGFR has been calculated using the CKD EPI equation.     This calculation has not been validated in all clinical situations.     eGFR's persistently <90 mL/min signify possible Chronic Kidney     Disease.  CBC     Status: None   Collection Time    03/17/14  3:52 AM      Result Value Ref Range   WBC 6.4  4.0 - 10.5 K/uL   RBC 4.37  4.22 - 5.81 MIL/uL   Hemoglobin 13.4  13.0 - 17.0 g/dL   HCT 39.4  39.0 - 52.0 %   MCV 90.2  78.0 - 100.0 fL   MCH 30.7  26.0 - 34.0 pg   MCHC 34.0  30.0 - 36.0 g/dL   RDW 12.3  11.5 - 15.5 %   Platelets 181  150 - 400 K/uL    Radiology/Results: Ct Abdomen Pelvis W Contrast  03/17/2014   CLINICAL DATA:  Low  abdominal pain with nausea.  Diverticulitis.  EXAM: CT ABDOMEN AND PELVIS WITH CONTRAST  TECHNIQUE: Multidetector CT imaging of the abdomen and pelvis was performed using the standard protocol following bolus administration of intravenous contrast.  CONTRAST:  166m OMNIPAQUE IOHEXOL 300 MG/ML  SOLN  COMPARISON:  CT ABD/PELVIS W CM dated 03/15/2014; CT ABD/PELVIS W CM dated 04/06/2013  FINDINGS: There is mildly increased atelectasis at both lung bases. There is no significant pleural or pericardial effusion. There are probable aortic valvular calcifications. Distal esophageal wall thickening is again noted.  The liver, spleen, gallbladder, pancreas and adrenal glands remain unremarkable in appearance. There is a nonobstructing calculus in the upper pole of the left kidney. No renal mass or hydronephrosis is demonstrated.  Changes related to acute sigmoid diverticulitis have not significantly changed from yesterday. There is bowel wall thickening, surrounding inflammatory change, minimal extraluminal fluid and adjacent reactive lymphadenopathy. No drainable fluid collection  or free intraperitoneal air is seen There is no evidence of bowel obstruction.  The bladder, prostate gland and seminal vesicles appear unremarkable. There are no worrisome osseous findings. Next  IMPRESSION: 1. Stable findings of acute sigmoid diverticulitis. No evidence of perforation, abscess or bowel obstruction. 2. Stable distal esophageal wall thickening and probable reactive mesenteric lymphadenopathy. 3. Suspected aortic valvular calcifications.   Electronically Signed   By: Camie Patience M.D.   On: 03/17/2014 10:56    Anti-infectives: Anti-infectives   Start     Dose/Rate Route Frequency Ordered Stop   03/16/14 1700  ertapenem (INVANZ) 1 g in sodium chloride 0.9 % 50 mL IVPB     1 g 100 mL/hr over 30 Minutes Intravenous Daily 03/16/14 1626     03/15/14 1000  ciprofloxacin (CIPRO) IVPB 400 mg  Status:  Discontinued     400 mg 200  mL/hr over 60 Minutes Intravenous Every 12 hours 03/15/14 0224 03/16/14 1626   03/15/14 0400  metroNIDAZOLE (FLAGYL) IVPB 500 mg  Status:  Discontinued     500 mg 100 mL/hr over 60 Minutes Intravenous Every 8 hours 03/15/14 0224 03/16/14 1626   03/15/14 0100  ciprofloxacin (CIPRO) IVPB 400 mg     400 mg 200 mL/hr over 60 Minutes Intravenous  Once 03/15/14 0057 03/15/14 0224   03/15/14 0100  metroNIDAZOLE (FLAGYL) IVPB 500 mg     500 mg 100 mL/hr over 60 Minutes Intravenous  Once 03/15/14 0057        Assessment/Plan: Problem List: Patient Active Problem List   Diagnosis Date Noted  . Sigmoid diverticulitis 03/15/2014  . Acute diverticulitis 03/15/2014  . LLQ abdominal pain 03/15/2014  . Leukocytosis 03/15/2014  . Gout 03/15/2014  . Blood in stool 05/13/2013  . Diverticulitis of colon with perforation 04/07/2013  . Obesity, Class III, BMI 40-49.9 (morbid obesity) 04/07/2013  . Esophageal thickening 04/07/2013  . Idiopathic gout of knee 04/06/2013    Tolerating clear liquids with small bm and gas.  Will advance to full liquids.  No drainable abscess seen.   * No surgery found *    LOS: 4 days   Matt B. Hassell Done, MD, [facility] [phone] beeper [phone]  03/18/2014 8:07 AM

## 2014-03-19 MED ORDER — METRONIDAZOLE 500 MG PO TABS
500.0000 mg | ORAL_TABLET | Freq: Three times a day (TID) | ORAL | Status: DC
  Administered 2014-03-19 – 2014-03-20 (×4): 500 mg via ORAL
  Filled 2014-03-19 (×6): qty 1

## 2014-03-19 MED ORDER — CIPROFLOXACIN HCL 500 MG PO TABS
500.0000 mg | ORAL_TABLET | Freq: Two times a day (BID) | ORAL | Status: DC
  Administered 2014-03-19 – 2014-03-20 (×3): 500 mg via ORAL
  Filled 2014-03-19 (×5): qty 1

## 2014-03-19 NOTE — Progress Notes (Signed)
[facility] PROGRESS NOTE  CAROLS CLEMENCE PQZ:[id number] DOB: 07-28-1966 DOA: 03/14/2014 PCP: No PCP Per Patient Brief HPI: Alec Kelly is a 48 y.o. male with a history of Diverticulosis, and Gout who presents to the ED with complaints of LLQ ABD Pain, Nausea and Vomiting and loose stools x 3 days. He denies fevers and chills. He denies hematemesis, melena or hematochezia, He was evaluated in the ED an on CT scan, he was found to have Sigmoid Diverticulitis and was placed on IV Cipro and Flagyl and referred for medical admission.   Assessment/Plan:   1. Acute Diverticulitis-patient completed 2 days of IV cipro and flagyl and antibiotics were changed to invanz by surgery.  IVFs, and Pain Control PRN,and Anti- Emetics PRN. REpeat CT shows persistent diverticulitis but no abscess orperforation. He was started on clears on 4/3 and advanced as tolerated. , . Surgery consulted appreciate recommendations.  2. LLQ ABD Pain- Due to #1.  3. Nausea and Vomiting- Anti-Emetics PRN.  4. Leukocytosis- due to #1.  5. Gout flare up: started on colchicine and ibuprofen DVT prophylaxis.    Code Status: FULL CODE  Family Communication: No Family Present  Disposition Plan: Inpatient     Consultants: surgery Procedures:  none  Antibiotics: cipro Flagyl invanz 4/3  HPI/Subjective:  abdominal pain improved.j toe pain improved .   Objective: Filed Vitals:   03/19/14 1840  BP: 119/82  Pulse: 68  Temp:   Resp: 18    Intake/Output Summary (Last 24 hours) at 03/19/14 1929 Last data filed at 03/19/14 1842  Gross per 24 hour  Intake    480 ml  Output   1000 ml  Net   -520 ml   Filed Weights   03/15/14 0232  Weight: 126.5 kg (278 lb 14.1 oz)    Exam: Alert afebrile comfortable.  CHEST: Normal respiration, clear to auscultation bilaterally  HEART: Regular rate and rhythm; no murmurs rubs or gallops  BACK: No kyphosis or scoliosis; no CVA tenderness  ABDOMEN: Positive Bowel  Sounds,Obese, soft, +LLQ ABD Tenderness improved, No Guarding, No Rebound Tenderness, No masses, no organomegaly, no intertriginous candida.   Data Reviewed: Basic Metabolic Panel:  Recent Labs Lab 03/14/14 2144 03/15/14 0440 03/17/14 0352  NA 137 137 139  K 3.9 4.4 4.4  CL 99 98 101  CO2 21 27 29   GLUCOSE 128* 112* 93  BUN 17 17 12   CREATININE 1.25 1.36* 1.21  CALCIUM 9.4 8.9 8.6   Liver Function Tests:  Recent Labs Lab 03/14/14 2144  AST 16  ALT 22  ALKPHOS 62  BILITOT 0.8  PROT 7.8  ALBUMIN 3.9   No results found for this basename: LIPASE, AMYLASE,  in the last 168 hours No results found for this basename: AMMONIA,  in the last 168 hours CBC:  Recent Labs Lab 03/14/14 2144 03/15/14 0440 03/17/14 0352  WBC 14.4* 13.6* 6.4  NEUTROABS 11.0*  --   --   HGB 16.4 14.2 13.4  HCT 46.1 41.9 39.4  MCV 88.8 90.7 90.2  PLT 262 226 181   Cardiac Enzymes: No results found for this basename: CKTOTAL, CKMB, CKMBINDEX, TROPONINI,  in the last 168 hours BNP (last 3 results) No results found for this basename: PROBNP,  in the last 8760 hours CBG: No results found for this basename: GLUCAP,  in the last 168 hours  No results found for this or any previous visit (from the past 240 hour(s)).   Studies: No results found.  Scheduled  Meds: . ciprofloxacin  500 mg Oral BID  . enoxaparin (LOVENOX) injection  60 mg Subcutaneous Daily  . ibuprofen  400 mg Oral TID  . metroNIDAZOLE  500 mg Oral 3 times per day  . pantoprazole  40 mg Oral BID   Continuous Infusions: . sodium chloride 100 mL/hr at 03/19/14 0503    Principal Problem:   Acute diverticulitis Active Problems:   Obesity, Class III, BMI 40-49.9 (morbid obesity)   LLQ abdominal pain   Leukocytosis   Gout    Time spent: 35 min    Alec Kelly  [facility] Pager [phone] If 7PM-7AM, please contact night-coverage at [email or web address], password [facility] 03/19/2014, 7:29 PM  LOS: 5 days

## 2014-03-19 NOTE — Progress Notes (Signed)
Patient ID: Alec Kelly, male   DOB: 07-08-1966, 48 y.o.   MRN: [id number] [facility] Progress Note:   * No surgery found *  Subjective: Mental status is sleepy but clear Objective: Vital signs in last 24 hours: Temp:  [97.5 F (36.4 C)-98 F (36.7 C)] 97.5 F (36.4 C) (04/05 0505) Pulse Rate:  [55-65] 55 (04/05 0505) Resp:  [16-20] 16 (04/05 0505) BP: (116-130)/(77-92) 123/77 mmHg (04/05 0505) SpO2:  [95 %-98 %] 95 % (04/05 0505)  Intake/Output from previous day: 04/04 0701 - 04/05 0700 In: 480 [P.O.:480] Out: 2000 [Urine:2000] Intake/Output this shift:    Physical Exam: Work of breathing is not labored.  Tenderness is less  Lab Results:  No results found for this or any previous visit (from the past 48 hour(s)).  Radiology/Results: Ct Abdomen Pelvis W Contrast  03/17/2014   CLINICAL DATA:  Low abdominal pain with nausea.  Diverticulitis.  EXAM: CT ABDOMEN AND PELVIS WITH CONTRAST  TECHNIQUE: Multidetector CT imaging of the abdomen and pelvis was performed using the standard protocol following bolus administration of intravenous contrast.  CONTRAST:  131mL OMNIPAQUE IOHEXOL 300 MG/ML  SOLN  COMPARISON:  CT ABD/PELVIS W CM dated 03/15/2014; CT ABD/PELVIS W CM dated 04/06/2013  FINDINGS: There is mildly increased atelectasis at both lung bases. There is no significant pleural or pericardial effusion. There are probable aortic valvular calcifications. Distal esophageal wall thickening is again noted.  The liver, spleen, gallbladder, pancreas and adrenal glands remain unremarkable in appearance. There is a nonobstructing calculus in the upper pole of the left kidney. No renal mass or hydronephrosis is demonstrated.  Changes related to acute sigmoid diverticulitis have not significantly changed from yesterday. There is bowel wall thickening, surrounding inflammatory change, minimal extraluminal fluid and adjacent reactive lymphadenopathy. No drainable fluid collection or free  intraperitoneal air is seen There is no evidence of bowel obstruction.  The bladder, prostate gland and seminal vesicles appear unremarkable. There are no worrisome osseous findings. Next  IMPRESSION: 1. Stable findings of acute sigmoid diverticulitis. No evidence of perforation, abscess or bowel obstruction. 2. Stable distal esophageal wall thickening and probable reactive mesenteric lymphadenopathy. 3. Suspected aortic valvular calcifications.   Electronically Signed   By: Camie Patience M.D.   On: 03/17/2014 10:56    Anti-infectives: Anti-infectives   Start     Dose/Rate Route Frequency Ordered Stop   03/19/14 0900  ciprofloxacin (CIPRO) tablet 500 mg     500 mg Oral 2 times daily 03/19/14 0846     03/19/14 0900  metroNIDAZOLE (FLAGYL) tablet 500 mg     500 mg Oral 3 times per day 03/19/14 0846     03/16/14 1700  ertapenem (INVANZ) 1 g in sodium chloride 0.9 % 50 mL IVPB  Status:  Discontinued     1 g 100 mL/hr over 30 Minutes Intravenous Daily 03/16/14 1626 03/19/14 0846   03/15/14 1000  ciprofloxacin (CIPRO) IVPB 400 mg  Status:  Discontinued     400 mg 200 mL/hr over 60 Minutes Intravenous Every 12 hours 03/15/14 0224 03/16/14 1626   03/15/14 0400  metroNIDAZOLE (FLAGYL) IVPB 500 mg  Status:  Discontinued     500 mg 100 mL/hr over 60 Minutes Intravenous Every 8 hours 03/15/14 0224 03/16/14 1626   03/15/14 0100  ciprofloxacin (CIPRO) IVPB 400 mg     400 mg 200 mL/hr over 60 Minutes Intravenous  Once 03/15/14 0057 03/15/14 0224   03/15/14 0100  metroNIDAZOLE (FLAGYL) IVPB 500 mg  Status:  Discontinued     500 mg 100 mL/hr over 60 Minutes Intravenous  Once 03/15/14 0057 03/19/14 0846      Assessment/Plan: Problem List: Patient Active Problem List   Diagnosis Date Noted  . Sigmoid diverticulitis 03/15/2014  . Acute diverticulitis 03/15/2014  . LLQ abdominal pain 03/15/2014  . Leukocytosis 03/15/2014  . Gout 03/15/2014  . Blood in stool 05/13/2013  . Diverticulitis of colon with  perforation 04/07/2013  . Obesity, Class III, BMI 40-49.9 (morbid obesity) 04/07/2013  . Esophageal thickening 04/07/2013  . Idiopathic gout of knee 04/06/2013    Will begin transition to low residue diet and oral antibiotics * No surgery found *    LOS: 5 days   Matt B. Hassell Done, MD, [facility] [phone] beeper [phone]  03/19/2014 8:49 AM

## 2014-03-20 DIAGNOSIS — K209 Esophagitis, unspecified without bleeding: Secondary | ICD-10-CM

## 2014-03-20 MED ORDER — OXYCODONE HCL 5 MG PO TABS: 5.0000 mg | ORAL_TABLET | Freq: Four times a day (QID) | ORAL | Status: DC | PRN

## 2014-03-20 MED ORDER — PANTOPRAZOLE SODIUM 40 MG PO TBEC: 40.0000 mg | DELAYED_RELEASE_TABLET | Freq: Two times a day (BID) | ORAL | Status: DC

## 2014-03-20 MED ORDER — METRONIDAZOLE 500 MG PO TABS: 500.0000 mg | ORAL_TABLET | Freq: Three times a day (TID) | ORAL | Status: DC

## 2014-03-20 MED ORDER — CIPROFLOXACIN HCL 500 MG PO TABS: 500.0000 mg | ORAL_TABLET | Freq: Two times a day (BID) | ORAL | Status: DC

## 2014-03-20 NOTE — Progress Notes (Signed)
Nutrition Brief Note/Patient Education  RD received consult for Low Residue/Diverticulitis diet education  Wt Readings from Last 15 Encounters:  03/15/14 278 lb 14.1 oz (126.5 kg)  10/08/13 270 lb (122.471 kg)  05/20/13 254 lb (115.214 kg)  05/13/13 251 lb (113.853 kg)  04/28/13 249 lb (112.946 kg)  04/18/13 242 lb (109.77 kg)  03/30/13 265 lb (120.203 kg)  03/27/13 265 lb (120.203 kg)  09/25/12 260 lb (117.935 kg)    Body mass index is 42.41 kg/(m^2). Patient meets criteria for Obesity III/Morbid Obesity based on current BMI.   Current diet order is Soft, patient is consuming approximately 75% of meals at this time. Labs and medications reviewed.   Provided pt with "Fiber Restricted Nutrition Therapy" handout and "5 Sample Menus for Gradually Adding Fiber" handout from the Academy of [employer] and Dietetics. Encouraged pt to consume low fiber foods to assist in recovery, and to avoid high fiber, fat, sugar, and caffeineated beverages.   Diet recall indicates pt consume large amounts of sodas, processed meats, with a variety of granola bars and fruit cups for snacks. Pt is a truck driver and seemed apprehensive to how he was going to comply with low fiber diet. MD advised pt be on low fiber for 3 weeks upon d/c. Recommend gradually incorporation of high fiber foods back into diet, by introducing one high fiber food each day over 5 day span  Expect poor to fair compliance, pt verbalized understanding and importance of complying to diet. To allievate  apprehension of diet, discussed low fiber meals and snacks that would be easy to eat prepare ahead of time and transportable for his occupation  Provided pt with RD contact information No additional nutrition interventions warranted at this time. If nutrition issues arise, please consult RD.   Atlee Abide MS RD LDN Clinical Dietitian [phone]

## 2014-03-20 NOTE — Plan of Care (Signed)
Problem: Phase II Progression Outcomes Goal: IV changed to normal saline lock Outcome: Completed/Met Date Met:  03/20/14 IV dc'd

## 2014-03-20 NOTE — Progress Notes (Signed)
Subjective: Pt feels great.  No N/V or abdominal pain.  Ambulating well and tolerating soft diet.  Dietitian came by.  He wants to know what he can eat in the truck.    Objective: Vital signs in last 24 hours: Temp:  [97.3 F (36.3 C)-97.8 F (36.6 C)] 97.3 F (36.3 C) (04/06 0514) Pulse Rate:  [58-68] 68 (04/06 0514) Resp:  [18] 18 (04/06 0514) BP: (119-129)/(77-88) 128/88 mmHg (04/06 0514) SpO2:  [97 %-99 %] 97 % (04/06 0514) Last BM Date: 03/18/14  Intake/Output from previous day: 04/05 0701 - 04/06 0700 In: 480 [P.O.:480] Out: 400 [Urine:400] Intake/Output this shift: Total I/O In: 3275 [P.O.:480; I.V.:2795] Out: -   PE: Gen:  Alert, NAD, pleasant Abd: Soft, NT/ND, +BS, no HSM, abdominal scars noted in RLQ and around umbilicus   Lab Results:  No results found for this basename: WBC, HGB, HCT, PLT,  in the last 72 hours BMET No results found for this basename: NA, K, CL, CO2, GLUCOSE, BUN, CREATININE, CALCIUM,  in the last 72 hours PT/INR No results found for this basename: LABPROT, INR,  in the last 72 hours CMP     Component Value Date/Time   NA 139 03/17/2014 0352   K 4.4 03/17/2014 0352   CL 101 03/17/2014 0352   CO2 29 03/17/2014 0352   GLUCOSE 93 03/17/2014 0352   BUN 12 03/17/2014 0352   CREATININE 1.21 03/17/2014 0352   CALCIUM 8.6 03/17/2014 0352   PROT 7.8 03/14/2014 2144   ALBUMIN 3.9 03/14/2014 2144   AST 16 03/14/2014 2144   ALT 22 03/14/2014 2144   ALKPHOS 62 03/14/2014 2144   BILITOT 0.8 03/14/2014 2144   GFRNONAA 70* 03/17/2014 0352   GFRAA 81* 03/17/2014 0352   Lipase     Component Value Date/Time   LIPASE 22 03/30/2013 1906       Studies/Results: No results found.  Anti-infectives: Anti-infectives   Start     Dose/Rate Route Frequency Ordered Stop   03/20/14 0000  ciprofloxacin (CIPRO) 500 MG tablet     500 mg Oral 2 times daily 03/20/14 1125     03/20/14 0000  metroNIDAZOLE (FLAGYL) 500 MG tablet     500 mg Oral Every 8 hours 03/20/14 1125     03/19/14 1000  metroNIDAZOLE (FLAGYL) tablet 500 mg     500 mg Oral 3 times per day 03/19/14 0846     03/19/14 0930  ciprofloxacin (CIPRO) tablet 500 mg     500 mg Oral 2 times daily 03/19/14 0846     03/16/14 1700  ertapenem (INVANZ) 1 g in sodium chloride 0.9 % 50 mL IVPB  Status:  Discontinued     1 g 100 mL/hr over 30 Minutes Intravenous Daily 03/16/14 1626 03/19/14 0846   03/15/14 1000  ciprofloxacin (CIPRO) IVPB 400 mg  Status:  Discontinued     400 mg 200 mL/hr over 60 Minutes Intravenous Every 12 hours 03/15/14 0224 03/16/14 1626   03/15/14 0400  metroNIDAZOLE (FLAGYL) IVPB 500 mg  Status:  Discontinued     500 mg 100 mL/hr over 60 Minutes Intravenous Every 8 hours 03/15/14 0224 03/16/14 1626   03/15/14 0100  ciprofloxacin (CIPRO) IVPB 400 mg     400 mg 200 mL/hr over 60 Minutes Intravenous  Once 03/15/14 0057 03/15/14 0224   03/15/14 0100  metroNIDAZOLE (FLAGYL) IVPB 500 mg  Status:  Discontinued     500 mg 100 mL/hr over 60 Minutes Intravenous  Once 03/15/14 0057  03/19/14 0846       Assessment/Plan Uncomplicated recurrent sigmoid diverticulitis  Esophagitis  H/o sigmoid diverticulitis with perforation - 03/2014  Leukocytosis  Morbid obesity - Body mass index is 42.41 kg/(m^2).  Gout with current flair   Plan:  1. He had diverticulitis last one 03/2013, also in 09/2012. He was non-compliant with follow up per Dr. Earl Lagos. Toth's instructions to obtain a colonoscopy and recommendation for elective surgical resection.  2. He appears to have improved with conservative treatment.  He is tolerating a soft diet.  He has been seen by the dietitian.  Pain well controlled and much improved. 3.  Okay to d/c home with 2 weeks of antibiotics - cipro/flagyl and pain meds 4.  He will need strict follow up with Dr. Ninfa Linden in 2 weeks.  The office will call with his appt time/date.  He will need a colonoscopy in 6-8 weeks with a GI.   5.  Stick to low fiber/low residue diet for now  until surgery and eventually high fiber diet. 6.  Will need to continue Protonix at discharge.    LOS: 6 days    Coralie Keens 03/20/2014, 11:26 AM Pager: [phone]

## 2014-03-20 NOTE — Discharge Summary (Signed)
Physician Discharge Summary  Alec Kelly EYC:[id number] DOB: [birth year] DOA: 03/14/2014  PCP: No PCP Per Patient  Admit date: 03/14/2014 Discharge date: 03/20/2014  Time spent: 30 minutes  Recommendations for Outpatient Follow-up:  1. Follow upwithPCP in one week 2. Follow up with surgery as recommended.   Discharge Diagnoses:  Principal Problem:   Acute diverticulitis Active Problems:   Obesity, Class III, BMI 40-49.9 (morbid obesity)   LLQ abdominal pain   Leukocytosis   Gout   Discharge Condition: improved.   Diet recommendation: soft / low residue diet  Filed Weights   03/15/14 0232  Weight: 126.5 kg (278 lb 14.1 oz)    History of present illness:  Alec Kelly is a 48 y.o. male with a history of Diverticulosis, and Gout who presents to the ED with complaints of LLQ ABD Pain, Nausea and Vomiting and loose stools x 3 days. He denies fevers and chills. He denies hematemesis, melena or hematochezia, He was evaluated in the ED an on CT scan, he was found to have Sigmoid Diverticulitis and was placed on IV Cipro and Flagyl and referred for medical admission.    Hospital Course:  1. Acute Diverticulitis-patient completed 2 days of IV cipro and flagyl and antibiotics were changed to invanz by surgery. IVFs, and Pain Control PRN,and Anti- Emetics PRN. REpeat CT shows persistent diverticulitis but no abscess orperforation. He was started on clears on 4/3 and advanced as tolerated. , . Surgery consulted  And recommendations given. He was on soft diet fore more than 24 hours and he has tolerated it without any issues. Surgery has recommended 2 weeks of antibiotics and pain control and follow up as outpatient with them.  2. LLQ ABD Pain- resolved.  3. Nausea and Vomiting- resolved 4. Leukocytosis- resolved with antibiotics.  5. Gout flare up: started on colchicine and ibuprofen and pain resolved.    Procedures:  CT abd and pelvis  Consultations:  surgery  Discharge  Exam: Filed Vitals:   03/20/14 0514  BP: 128/88  Pulse: 68  Temp: 97.3 F (36.3 C)  Resp: 18   Alert afebrile comfortable.  CHEST: Normal respiration, clear to auscultation bilaterally  HEART: Regular rate and rhythm; no murmurs rubs or gallops  BACK: No kyphosis or scoliosis; no CVA tenderness  ABDOMEN: Positive Bowel Sounds,Obese, soft, +LLQ ABD Tenderness improved, No Guarding, No Rebound Tenderness, No masses, no organomegaly, no intertriginous candida.    Discharge Instructions You were cared for by a hospitalist during your hospital stay. If you have any questions about your discharge medications or the care you received while you were in the hospital after you are discharged, you can call the unit and asked to speak with the hospitalist on call if the hospitalist that took care of you is not available. Once you are discharged, your primary care physician will handle any further medical issues. Please note that NO REFILLS for any discharge medications will be authorized once you are discharged, as it is imperative that you return to your primary care physician (or establish a relationship with a primary care physician if you do not have one) for your aftercare needs so that they can reassess your need for medications and monitor your lab values.  Discharge Orders   Future Appointments Provider Department Dept Phone   03/31/2014 11:30 AM [facility] [phone]   04/10/2014 10:20 AM Harl Bowie, [facility], [place] [phone]   05/03/2014 4:00 PM Vernon Prey  Annitta Needs, [place] [phone]   Future Orders Complete By Expires   Discharge instructions  As directed    Comments:     Follow up with surgery as recommended.  Follow upw ith PCP in one week.       Medication List         ciprofloxacin 500 MG tablet  Commonly known as:  CIPRO  Take 1 tablet (500 mg total) by  mouth 2 (two) times daily.     metroNIDAZOLE 500 MG tablet  Commonly known as:  FLAGYL  Take 1 tablet (500 mg total) by mouth every 8 (eight) hours.     oxyCODONE 5 MG immediate release tablet  Commonly known as:  Oxy IR/ROXICODONE  Take 1 tablet (5 mg total) by mouth every 6 (six) hours as needed for moderate pain.     pantoprazole 40 MG tablet  Commonly known as:  PROTONIX  Take 1 tablet (40 mg total) by mouth 2 (two) times daily.       No Known Allergies     Follow-up Information   Follow up with [facility]     On 03/31/2014. (appointment for orange card @ 11:30am)    Contact information:   [place] [address] [phone]      Follow up with [facility]     On 05/03/2014. (PCP appointment or follow up anytime as walk-in )    Contact information:   [place] [address] [phone]      Follow up with Harl Bowie, MD On 04/10/2014. ([place] 10:20AM, ARRIVE BY 9:50AM FOR CHECK IN AND TO FILL OUT PAPERWORK)    Specialty:  General Surgery   Contact information:   [address] [place] [address] [phone]       Follow up with No PCP Per Patient.   Specialty:  General Practice       The results of significant diagnostics from this hospitalization (including imaging, microbiology, ancillary and laboratory) are listed below for reference.    Significant Diagnostic Studies: Ct Abdomen Pelvis W Contrast  03/17/2014   CLINICAL DATA:  Low abdominal pain with nausea.  Diverticulitis.  EXAM: CT ABDOMEN AND PELVIS WITH CONTRAST  TECHNIQUE: Multidetector CT imaging of the abdomen and pelvis was performed using the standard protocol following bolus administration of intravenous contrast.  CONTRAST:  132mL OMNIPAQUE IOHEXOL 300 MG/ML  SOLN  COMPARISON:  CT ABD/PELVIS W CM dated 03/15/2014; CT ABD/PELVIS W CM dated 04/06/2013  FINDINGS:  There is mildly increased atelectasis at both lung bases. There is no significant pleural or pericardial effusion. There are probable aortic valvular calcifications. Distal esophageal wall thickening is again noted.  The liver, spleen, gallbladder, pancreas and adrenal glands remain unremarkable in appearance. There is a nonobstructing calculus in the upper pole of the left kidney. No renal mass or hydronephrosis is demonstrated.  Changes related to acute sigmoid diverticulitis have not significantly changed from yesterday. There is bowel wall thickening, surrounding inflammatory change, minimal extraluminal fluid and adjacent reactive lymphadenopathy. No drainable fluid collection or free intraperitoneal air is seen There is no evidence of bowel obstruction.  The bladder, prostate gland and seminal vesicles appear unremarkable. There are no worrisome osseous findings. Next  IMPRESSION: 1. Stable findings of acute sigmoid diverticulitis. No evidence of perforation, abscess or bowel obstruction. 2. Stable distal esophageal wall thickening and  probable reactive mesenteric lymphadenopathy. 3. Suspected aortic valvular calcifications.   Electronically Signed   By: Camie Patience M.D.   On: 03/17/2014 10:56   Ct Abdomen Pelvis W Contrast  03/15/2014   CLINICAL DATA:  Left lower quadrant pain. History of prior diverticulitis.  EXAM: CT ABDOMEN AND PELVIS WITH CONTRAST  TECHNIQUE: Multidetector CT imaging of the abdomen and pelvis was performed using the standard protocol following bolus administration of intravenous contrast.  CONTRAST:  8mL OMNIPAQUE IOHEXOL 300 MG/ML SOLN, 162mL OMNIPAQUE IOHEXOL 300 MG/ML SOLN  COMPARISON:  Prior CT abdomen/ pelvis 04/06/2013  FINDINGS: Lower Chest: The visualized lung bases are clear. Visualized cardiac structures within normal limits for size. No pericardial effusion. Incompletely imaged calcification of the mitral valve annulus. Circumferential thickening of the distal esophagus is  similar compared to prior.  Abdomen/Pelvis: Unremarkable CT appearance of the stomach, duodenum, spleen, adrenal glands, pancreas and liver. Gallbladder is unremarkable. No intra or extrahepatic biliary ductal dilatation. No hydronephrosis. Punctate nonobstructing left nephrolithiasis again noted. No enhancing renal lesion.  Sigmoid colonic diverticulosis. There is extensive wall thickening of the sigmoid colon with surrounding interstitial inflammatory stranding in the pericolonic fat consistent with acute diverticulitis. There is a small amount of a non loculated free fluid extending into the pelvis. No loculation or a free air to suggest perforation or abscess. Sigmoid mesenteric lymphadenopathy and mild left iliac adenopathy are similar compared to prior. Left external iliac station node unchanged at 7 mm. Mildly prominent sigmoid mesenteric nodes measure up to 1 cm which is also unchanged compared to prior.  The bladder is decompressed. Unremarkable prostate gland and seminal vesicles. No evidence of small bowel obstruction.  Bones/Soft Tissues: No acute fracture or aggressive appearing lytic or blastic osseous lesion.  Vascular: Minimal atherosclerotic vascular calcifications. No aneurysmal dilatation.  IMPRESSION: 1. Acute uncomplicated sigmoid diverticulitis. 2. Stable sigmoid mesocolon and left external iliac lymphadenopathy. The nodes are almost certainly reactive given the near 1 year stability. 3. Nonobstructing left nephrolithiasis. 4. Similar appearance of circumferential wall thickening of the distal esophagus. Query clinical history of gastroesophageal reflux disease and esophagitis?   Electronically Signed   By: Jacqulynn Cadet M.D.   On: 03/15/2014 00:45    Microbiology: No results found for this or any previous visit (from the past 240 hour(s)).   Labs: Basic Metabolic Panel:  Recent Labs Lab 03/14/14 2144 03/15/14 0440 03/17/14 0352  NA 137 137 139  K 3.9 4.4 4.4  CL 99 98 101   CO2 21 27 29   GLUCOSE 128* 112* 93  BUN 17 17 12   CREATININE 1.25 1.36* 1.21  CALCIUM 9.4 8.9 8.6   Liver Function Tests:  Recent Labs Lab 03/14/14 2144  AST 16  ALT 22  ALKPHOS 62  BILITOT 0.8  PROT 7.8  ALBUMIN 3.9   No results found for this basename: LIPASE, AMYLASE,  in the last 168 hours No results found for this basename: AMMONIA,  in the last 168 hours CBC:  Recent Labs Lab 03/14/14 2144 03/15/14 0440 03/17/14 0352  WBC 14.4* 13.6* 6.4  NEUTROABS 11.0*  --   --   HGB 16.4 14.2 13.4  HCT 46.1 41.9 39.4  MCV 88.8 90.7 90.2  PLT 262 226 181   Cardiac Enzymes: No results found for this basename: CKTOTAL, CKMB, CKMBINDEX, TROPONINI,  in the last 168 hours BNP: BNP (last 3 results) No results found for this basename: PROBNP,  in the last 8760 hours CBG: No results found for this basename:  GLUCAP,  in the last 168 hours     Signed:  Alfonzo Arca  [facility] 03/20/2014, 1:15 PM

## 2014-03-20 NOTE — Discharge Instructions (Signed)
Diverticulitis °A diverticulum is a small pouch or sac on the colon. Diverticulosis is the presence of these diverticula on the colon. Diverticulitis is the irritation (inflammation) or infection of diverticula. °CAUSES  °The colon and its diverticula contain bacteria. If food particles block the tiny opening to a diverticulum, the bacteria inside can grow and cause an increase in pressure. This leads to infection and inflammation and is called diverticulitis. °SYMPTOMS  °· Abdominal pain and tenderness. Usually, the pain is located on the left side of your abdomen. However, it could be located elsewhere. °· Fever. °· Bloating. °· Feeling sick to your stomach (nausea). °· Throwing up (vomiting). °· Abnormal stools. °DIAGNOSIS  °Your caregiver will take a history and perform a physical exam. Since many things can cause abdominal pain, other tests may be necessary. Tests may include: °· Blood tests. °· Urine tests. °· X-ray of the abdomen. °· CT scan of the abdomen. °Sometimes, surgery is needed to determine if diverticulitis or other conditions are causing your symptoms. °TREATMENT  °Most of the time, you can be treated without surgery. Treatment includes: °· Resting the bowels by only having liquids for a few days. As you improve, you will need to eat a low-fiber diet. °· Intravenous (IV) fluids if you are losing body fluids (dehydrated). °· Antibiotic medicines that treat infections may be given. °· Pain and nausea medicine, if needed. °· Surgery if the inflamed diverticulum has burst. °HOME CARE INSTRUCTIONS  °· Try a clear liquid diet (broth, tea, or water for as long as directed by your caregiver). You may then gradually begin a low-fiber diet as tolerated.  °A low-fiber diet is a diet with less than 10 grams of fiber. Choose the foods below to reduce fiber in the diet: °· White breads, cereals, rice, and pasta. °· Cooked fruits and vegetables or soft fresh fruits and vegetables without the skin. °· Ground or  well-cooked tender beef, ham, veal, lamb, pork, or poultry. °· Eggs and seafood. °· After your diverticulitis symptoms have improved, your caregiver may put you on a high-fiber diet. A high-fiber diet includes 14 grams of fiber for every 1000 calories consumed. For a standard 2000 calorie diet, you would need 28 grams of fiber. Follow these diet guidelines to help you increase the fiber in your diet. It is important to slowly increase the amount fiber in your diet to avoid gas, constipation, and bloating. °· Choose whole-grain breads, cereals, pasta, and brown rice. °· Choose fresh fruits and vegetables with the skin on. Do not overcook vegetables because the more vegetables are cooked, the more fiber is lost. °· Choose more nuts, seeds, legumes, dried peas, beans, and lentils. °· Look for food products that have greater than 3 grams of fiber per serving on the Nutrition Facts label. °· Take all medicine as directed by your caregiver. °· If your caregiver has given you a follow-up appointment, it is very important that you go. Not going could result in lasting (chronic) or permanent injury, pain, and disability. If there is any problem keeping the appointment, call to reschedule. °SEEK MEDICAL CARE IF:  °· Your pain does not improve. °· You have a hard time advancing your diet beyond clear liquids. °· Your bowel movements do not return to normal. °SEEK IMMEDIATE MEDICAL CARE IF:  °· Your pain becomes worse. °· You have an oral temperature above 102° F (38.9° C), not controlled by medicine. °· You have repeated vomiting. °· You have bloody or black, tarry stools. °·   Symptoms that brought you to your caregiver become worse or are not getting better. MAKE SURE YOU:   Understand these instructions.  Will watch your condition.  Will get help right away if you are not doing well or get worse. Document Released: 09/10/2005 Document Revised: 02/23/2012 Document Reviewed: 01/06/2011 Exit[employer] Patient Information  2014 [employer].   Low-Fiber Diet Fiber is found in fruits, vegetables, and grains. A low-fiber diet restricts fibrous foods that are not digested in the small intestine. A diet containing about 10 grams of fiber is considered low fiber.  PURPOSE  To prevent blockage of a partially obstructed or narrowed gastrointestinal tract.  To reduce fecal weight and volume.  To slow the movement of feces. WHEN IS THIS DIET USED?  It may be used during the acute phase of Crohn disease, ulcerative colitis, regional enteritis, or diverticulitis.  It may be used if your intestinal or esophageal tubes are narrowing (stenosis).  It may be used as a transitional diet following surgery, injury (trauma), or illness. CHOOSING FOODS Check labels, especially on foods from the starch list. Often times, dietary fiber content is listed on the nutrition facts panel. Please ask your Registered Dietitian if you have questions about specific foods that are related to your condition, especially if the food is not listed on this handout. Breads and Starches  Allowed: White, French, and pita breads, plain rolls, buns, or sweet rolls, doughnuts, waffles, pancakes, bagels. Plain muffins, biscuits, matzoth. Soda, saltine, graham crackers. Pretzels, rusks, melba toast, zwieback. Cooked cereals: cornmeal, farina, or cream cereals. Dry cereals: refined corn, wheat, rice, and oat cereals (check label). Potatoes prepared any way without skins, refined macaroni, spaghetti, noodles, refined rice.  Avoid: Whole-wheat bread, rolls, and crackers. Multigrains, rye, bran seeds, nuts, or coconut. Cereals containing whole grains, multigrains, bran, coconut, nuts, raisins. Cooked or dry oatmeal. Coarse wheat cereals, granola. Cereals advertised as "high fiber." Potato skins. Whole-grain pasta, wild or brown rice. Popcorn. Vegetables  Allowed: Strained tomato and vegetable juices. Fresh lettuce, cucumber, spinach. Well-cooked or  canned: asparagus, bean sprouts, broccoli, cut green beans, cauliflower, pumpkin, beets, mushrooms, yellow squash, tomato, tomato sauce, zucchini, turnips.Keep servings limited to  cup.  Avoid: Fresh, cooked, or canned: artichokes, baked beans, beet greens, Brussels sprouts, corn, kale, legumes, peas, sweet potatoes. Avoid large servings of any vegetables. Fruit  Allowed: All fruit juices except prune juice. Cooked or canned fruits without skin and seeds: apricots, applesauce, cantaloupe, cherries, grapefruit, grapes, kiwi, mandarin oranges, peaches, pears, fruit cocktail, pineapple, plums, watermelon. Fresh without skin: banana, grapes, cantaloupe, avocado, cherries, pineapple, kiwi, nectarines, peaches, blueberries. Keep servings limited to  cup or 1 piece.  Avoid: Fresh: apples with or without skin, apricots, mangoes, pears, raspberries, strawberries. Prune juice and juices with pulp, stewed or dried prunes. Dried fruits, raisins, dates. Avoid large servings of all fresh fruits. Meat and Protein Substitutes  Allowed: Ground or well-cooked tender beef, ham, veal, lamb, pork, poultry. Eggs, plain cheese. Fish, oysters, shrimp, lobster, other seafood. Liver, organ meats. Smooth nut butters.  Avoid: Tough, fibrous meats with gristle. Chunky nut butter.Cheese with seeds, nuts, or other foods not allowed. Nuts, seeds, legumes, dried peas, beans, lentils. Dairy  Allowed: All milk products except those not allowed.  Avoid: Yogurt or cheese that contains nuts, seeds, or added fruit. Soups and Combination Foods  Allowed: Bouillon, broth, or cream soups made from allowed foods. Any strained soup. Casseroles or mixed dishes made with allowed foods.  Avoid: Soups made from vegetables that are   not allowed or that contain other foods not allowed. Desserts and Sweets  Allowed:Plain cakes and cookies, pie made with allowed fruit, pudding, custard, cream pie. Gelatin, fruit, ice, sherbet, frozen ice  pops. Ice cream, ice milk without nuts. Plain hard candy, honey, jelly, molasses, syrup, sugar, chocolate syrup, gumdrops, marshmallows.  Avoid: Desserts, cookies, or candies that contain nuts, peanut butter, dried fruits. Jams, preserves with seeds, marmalade. Fats and Oils  Allowed:Margarine, butter, cream, mayonnaise, salad oils, plain salad dressings made from allowed foods.  Avoid: Seeds, nuts, olives. Beverages  Allowed: All, except those listed to avoid.  Avoid: Fruit juices with high pulp, prune juice. Condiments  Allowed:Ketchup, mustard, horseradish, vinegar, cream sauce, cheese sauce, cocoa powder. Spices in moderation: allspice, basil, bay leaves, celery powder or leaves, cinnamon, cumin powder, curry powder, ginger, mace, marjoram, onion or garlic powder, oregano, paprika, parsley flakes, ground pepper, rosemary, sage, savory, tarragon, thyme, turmeric.  Avoid: Coconut, pickles. SAMPLE MENU Breakfast   cup orange juice.  1 boiled egg.  1 slice white toast.  Margarine.   cup cornflakes.  1 cup milk.  Beverage. Lunch   cup chicken noodle soup.  2 to 3 oz sliced roast beef.  2 slices white bread.  Mayonnaise.   cup tomato juice.  1 small banana.  Beverage. Dinner  3 oz baked chicken.   cup scalloped potatoes.   cup cooked beets.  White dinner roll.  Margarine.   cup canned peaches.  Beverage. Document Released: 05/23/2002 Document Revised: 08/03/2013 Document Reviewed: 12/18/2011 Exit[employer] Patient Information 2014 [employer].  

## 2014-03-20 NOTE — Progress Notes (Signed)
Explained discharge instructions to pt, wrote down when next med doses are due. Prescriptions-3 given to pt. Wife in to pick pt. Up and bring home.

## 2014-03-21 ENCOUNTER — Encounter (INDEPENDENT_AMBULATORY_CARE_PROVIDER_SITE_OTHER): Payer: Self-pay

## 2014-03-31 ENCOUNTER — Ambulatory Visit: Payer: Self-pay

## 2014-04-02 ENCOUNTER — Encounter (HOSPITAL_COMMUNITY): Payer: Self-pay | Admitting: Emergency Medicine

## 2014-04-02 ENCOUNTER — Emergency Department (HOSPITAL_COMMUNITY): Payer: Self-pay

## 2014-04-02 ENCOUNTER — Emergency Department (HOSPITAL_COMMUNITY)
Admission: EM | Admit: 2014-04-02 | Discharge: 2014-04-02 | Disposition: A | Discharge status: Home / Self Care | Payer: Self-pay | Attending: Emergency Medicine | Admitting: Emergency Medicine

## 2014-04-02 DIAGNOSIS — K5732 Diverticulitis of large intestine without perforation or abscess without bleeding: Secondary | ICD-10-CM | POA: Insufficient documentation

## 2014-04-02 DIAGNOSIS — K5792 Diverticulitis of intestine, part unspecified, without perforation or abscess without bleeding: Secondary | ICD-10-CM

## 2014-04-02 DIAGNOSIS — M549 Dorsalgia, unspecified: Secondary | ICD-10-CM | POA: Insufficient documentation

## 2014-04-02 DIAGNOSIS — Z792 Long term (current) use of antibiotics: Secondary | ICD-10-CM | POA: Insufficient documentation

## 2014-04-02 DIAGNOSIS — Z79899 Other long term (current) drug therapy: Secondary | ICD-10-CM | POA: Insufficient documentation

## 2014-04-02 DIAGNOSIS — Z87442 Personal history of urinary calculi: Secondary | ICD-10-CM | POA: Insufficient documentation

## 2014-04-02 LAB — COMPREHENSIVE METABOLIC PANEL
ALK PHOS: 60 U/L (ref 39–117)
ALT: 37 U/L (ref 0–53)
AST: 31 U/L (ref 0–37)
Albumin: 3.8 g/dL (ref 3.5–5.2)
BUN: 18 mg/dL (ref 6–23)
CO2: 24 meq/L (ref 19–32)
Calcium: 9 mg/dL (ref 8.4–10.5)
Chloride: 101 mEq/L (ref 96–112)
Creatinine, Ser: 1.16 mg/dL (ref 0.50–1.35)
GFR calc non Af Amer: 73 mL/min — ABNORMAL LOW (ref >90)
GFR, EST AFRICAN AMERICAN: 85 mL/min — AB (ref >90)
GLUCOSE: 98 mg/dL (ref 70–99)
POTASSIUM: 4.6 meq/L (ref 3.7–5.3)
SODIUM: 139 meq/L (ref 137–147)
TOTAL PROTEIN: 7.3 g/dL (ref 6.0–8.3)
Total Bilirubin: 0.4 mg/dL (ref 0.3–1.2)

## 2014-04-02 LAB — CBC WITH DIFFERENTIAL/PLATELET
BASOS ABS: 0.1 10*3/uL (ref 0.0–0.1)
Basophils Relative: 1 % (ref 0–1)
EOS ABS: 0.2 10*3/uL (ref 0.0–0.7)
Eosinophils Relative: 2 % (ref 0–5)
HCT: 43.2 % (ref 39.0–52.0)
HEMOGLOBIN: 15.1 g/dL (ref 13.0–17.0)
Lymphocytes Relative: 22 % (ref 12–46)
Lymphs Abs: 2.4 10*3/uL (ref 0.7–4.0)
MCH: 30.8 pg (ref 26.0–34.0)
MCHC: 35 g/dL (ref 30.0–36.0)
MCV: 88.2 fL (ref 78.0–100.0)
MONOS PCT: 9 % (ref 3–12)
Monocytes Absolute: 1 10*3/uL (ref 0.1–1.0)
NEUTROS PCT: 67 % (ref 43–77)
Neutro Abs: 7.2 10*3/uL (ref 1.7–7.7)
Platelets: 293 10*3/uL (ref 150–400)
RBC: 4.9 MIL/uL (ref 4.22–5.81)
RDW: 12.6 % (ref 11.5–15.5)
WBC: 10.9 10*3/uL — ABNORMAL HIGH (ref 4.0–10.5)

## 2014-04-02 LAB — LIPASE, BLOOD: Lipase: 146 U/L — ABNORMAL HIGH (ref 11–59)

## 2014-04-02 MED ORDER — SODIUM CHLORIDE 0.9 % IV BOLUS (SEPSIS)
250.0000 mL | Freq: Once | INTRAVENOUS | Status: AC
  Administered 2014-04-02: 250 mL via INTRAVENOUS

## 2014-04-02 MED ORDER — HYDROCODONE-ACETAMINOPHEN 5-325 MG PO TABS: 1.0000 | ORAL_TABLET | Freq: Four times a day (QID) | ORAL | Status: DC | PRN

## 2014-04-02 MED ORDER — ONDANSETRON HCL 4 MG/2ML IJ SOLN
4.0000 mg | Freq: Once | INTRAMUSCULAR | Status: AC
  Administered 2014-04-02: 4 mg via INTRAVENOUS
  Filled 2014-04-02: qty 2

## 2014-04-02 MED ORDER — AMOXICILLIN-POT CLAVULANATE 875-125 MG PO TABS: 1.0000 | ORAL_TABLET | Freq: Two times a day (BID) | ORAL | Status: DC

## 2014-04-02 MED ORDER — SODIUM CHLORIDE 0.9 % IV SOLN
3.0000 g | Freq: Once | INTRAVENOUS | Status: AC
  Administered 2014-04-02: 3 g via INTRAVENOUS
  Filled 2014-04-02: qty 3

## 2014-04-02 MED ORDER — IOHEXOL 300 MG/ML  SOLN: 100.0000 mL | Freq: Once | INTRAMUSCULAR | Status: DC | PRN

## 2014-04-02 MED ORDER — SODIUM CHLORIDE 0.9 % IV SOLN: INTRAVENOUS | Status: DC

## 2014-04-02 MED ORDER — ONDANSETRON 4 MG PO TBDP: 4.0000 mg | ORAL_TABLET | Freq: Three times a day (TID) | ORAL | Status: DC | PRN

## 2014-04-02 NOTE — ED Notes (Signed)
Patient transported to CT 

## 2014-04-02 NOTE — ED Notes (Signed)
Pt states he was admitted for diverticulitis 2 weeks ago. Pt states he was discharged, but pain has not gone away. Pt states he has not had opportunity to follow up with GI MD. Pt denies symptoms have gotten worse, denies n/v/d.

## 2014-04-02 NOTE — Discharge Instructions (Signed)
Diverticulitis °A diverticulum is a small pouch or sac on the colon. Diverticulosis is the presence of these diverticula on the colon. Diverticulitis is the irritation (inflammation) or infection of diverticula. °CAUSES  °The colon and its diverticula contain bacteria. If food particles block the tiny opening to a diverticulum, the bacteria inside can grow and cause an increase in pressure. This leads to infection and inflammation and is called diverticulitis. °SYMPTOMS  °· Abdominal pain and tenderness. Usually, the pain is located on the left side of your abdomen. However, it could be located elsewhere. °· Fever. °· Bloating. °· Feeling sick to your stomach (nausea). °· Throwing up (vomiting). °· Abnormal stools. °DIAGNOSIS  °Your caregiver will take a history and perform a physical exam. Since many things can cause abdominal pain, other tests may be necessary. Tests may include: °· Blood tests. °· Urine tests. °· X-ray of the abdomen. °· CT scan of the abdomen. °Sometimes, surgery is needed to determine if diverticulitis or other conditions are causing your symptoms. °TREATMENT  °Most of the time, you can be treated without surgery. Treatment includes: °· Resting the bowels by only having liquids for a few days. As you improve, you will need to eat a low-fiber diet. °· Intravenous (IV) fluids if you are losing body fluids (dehydrated). °· Antibiotic medicines that treat infections may be given. °· Pain and nausea medicine, if needed. °· Surgery if the inflamed diverticulum has burst. °HOME CARE INSTRUCTIONS  °· Try a clear liquid diet (broth, tea, or water for as long as directed by your caregiver). You may then gradually begin a low-fiber diet as tolerated.  °A low-fiber diet is a diet with less than 10 grams of fiber. Choose the foods below to reduce fiber in the diet: °· White breads, cereals, rice, and pasta. °· Cooked fruits and vegetables or soft fresh fruits and vegetables without the skin. °· Ground or  well-cooked tender beef, ham, veal, lamb, pork, or poultry. °· Eggs and seafood. °· After your diverticulitis symptoms have improved, your caregiver may put you on a high-fiber diet. A high-fiber diet includes 14 grams of fiber for every 1000 calories consumed. For a standard 2000 calorie diet, you would need 28 grams of fiber. Follow these diet guidelines to help you increase the fiber in your diet. It is important to slowly increase the amount fiber in your diet to avoid gas, constipation, and bloating. °· Choose whole-grain breads, cereals, pasta, and brown rice. °· Choose fresh fruits and vegetables with the skin on. Do not overcook vegetables because the more vegetables are cooked, the more fiber is lost. °· Choose more nuts, seeds, legumes, dried peas, beans, and lentils. °· Look for food products that have greater than 3 grams of fiber per serving on the Nutrition Facts label. °· Take all medicine as directed by your caregiver. °· If your caregiver has given you a follow-up appointment, it is very important that you go. Not going could result in lasting (chronic) or permanent injury, pain, and disability. If there is any problem keeping the appointment, call to reschedule. °SEEK MEDICAL CARE IF:  °· Your pain does not improve. °· You have a hard time advancing your diet beyond clear liquids. °· Your bowel movements do not return to normal. °SEEK IMMEDIATE MEDICAL CARE IF:  °· Your pain becomes worse. °· You have an oral temperature above 102° F (38.9° C), not controlled by medicine. °· You have repeated vomiting. °· You have bloody or black, tarry stools. °·   Symptoms that brought you to your caregiver become worse or are not getting better. MAKE SURE YOU:   Understand these instructions.  Will watch your condition.  Will get help right away if you are not doing well or get worse. Document Released: 09/10/2005 Document Revised: 02/23/2012 Document Reviewed: 01/06/2011 [facility] Patient Information  2014 [place].  Again a bike as directed. Recommend clear liquids for the next 24 hours then bland diet. Not improved in 2 days must return. Return for any new or worse symptoms. Keep your appointment with [facility]. Work note provided.

## 2014-04-02 NOTE — ED Provider Notes (Signed)
CSN: [id number]     Arrival date & time 04/02/14  1537 History   First MD Initiated Contact with Patient 04/02/14 1541     Chief Complaint  Patient presents with  . Abdominal Pain     (Consider location/radiation/quality/duration/timing/severity/associated sxs/prior Treatment) Patient is a 48 y.o. male presenting with abdominal pain. The history is provided by the patient.  Abdominal Pain Associated symptoms: nausea   Associated symptoms: no chest pain, no diarrhea, no dysuria, no fever, no shortness of breath and no vomiting    patient admitted the March 31 for acute diverticulitis. Was in the hospital for 6 days. Receiving IV antibiotics. Completed by mouth antibiotics a few days ago. Patient states that since discharge has always had some discomfort in the lower part of the abdomen. Patient's had a history of diverticulitis in the past. Patient has referral to [facility] surgery. Patient with nausea no vomiting no fevers. Patient states that although symptoms have been persistent he has certainly not gotten worse compared to how he was when he was admitted on March 31.  Past Medical History  Diagnosis Date  . Kidney stones   . Diverticulitis   . Diverticulitis of sigmoid colon 04/07/2013    With perforation  . Obesity, Class III, BMI 40-49.9 (morbid obesity) 04/07/2013  . 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 Tonsillectomy    . Appendectomy      33-36 years old - Dr. Lennie Hummer  . Hernia repair  04/15/7615    umbilical  - At [facility], Dr. Lennie Hummer, Repair of umbilical hernia with mesh.  . Lithotripsy     Family History  Problem Relation Age of Onset  . Heart failure Mother   . Diabetes Mother   . Hypertension Mother   . Hyperlipidemia Mother   . COPD Mother   . Cancer Father     lung & prostate  . Hyperlipidemia Father   . Heart disease Father    History  Substance Use Topics  . Smoking status: Never Smoker   .  Smokeless tobacco: Never Used  . Alcohol Use: Yes    Review of Systems  Constitutional: Negative for fever.  HENT: Negative for congestion.   Eyes: Negative for redness.  Respiratory: Negative for shortness of breath.   Cardiovascular: Negative for chest pain.  Gastrointestinal: Positive for nausea and abdominal pain. Negative for vomiting, diarrhea and blood in stool.  Genitourinary: Negative for dysuria.  Musculoskeletal: Positive for back pain.  Skin: Negative for rash.  Neurological: Negative for headaches.  Hematological: Does not bruise/bleed easily.  Psychiatric/Behavioral: Negative for confusion.      Allergies  Ivp dye  Home Medications   Prior to Admission medications   Medication Sig Start Date End Date Taking? Authorizing Provider  ciprofloxacin (CIPRO) 500 MG tablet Take 1 tablet (500 mg total) by mouth 2 (two) times daily. 03/20/14  Yes Megan Dort, PA-C  ibuprofen (ADVIL,MOTRIN) 200 MG tablet Take 400-800 mg by mouth every 6 (six) hours as needed for mild pain or moderate pain.   Yes Historical Provider, MD  metroNIDAZOLE (FLAGYL) 500 MG tablet Take 1 tablet (500 mg total) by mouth every 8 (eight) hours. 03/20/14  Yes Megan Dort, PA-C  oxyCODONE (OXY IR/ROXICODONE) 5 MG immediate release tablet Take 1 tablet (5 mg total) by mouth every 6 (six) hours as needed for moderate pain. 03/20/14  Yes Megan Dort, PA-C  pantoprazole (PROTONIX) 40 MG  tablet Take 1 tablet (40 mg total) by mouth 2 (two) times daily. 03/20/14  Yes Megan Dort, PA-C  amoxicillin-clavulanate (AUGMENTIN) 875-125 MG per tablet Take 1 tablet by mouth every 12 (twelve) hours. 04/02/14   Mervin Kung, MD  HYDROcodone-acetaminophen (NORCO/VICODIN) 5-325 MG per tablet Take 1-2 tablets by mouth every 6 (six) hours as needed for moderate pain. 04/02/14   Mervin Kung, MD  ondansetron (ZOFRAN ODT) 4 MG disintegrating tablet Take 1 tablet (4 mg total) by mouth every 8 (eight) hours as needed for nausea or  vomiting. 04/02/14   Mervin Kung, MD   BP 120/79  Pulse 94  Temp(Src) 98.9 F (37.2 C) (Oral)  Resp 18  SpO2 95% Physical Exam  Nursing note and vitals reviewed. Constitutional: He is oriented to person, place, and time. He appears well-developed and well-nourished. No distress.  HENT:  Head: Normocephalic and atraumatic.  Mouth/Throat: Oropharynx is clear and moist.  Eyes: Conjunctivae and EOM are normal. Pupils are equal, round, and reactive to light.  Neck: Normal range of motion. Neck supple.  Cardiovascular: Normal rate, regular rhythm and normal heart sounds.   No murmur heard. Pulmonary/Chest: Effort normal and breath sounds normal. No respiratory distress.  Abdominal: Soft. Bowel sounds are normal. He exhibits no mass. There is tenderness. There is no rebound and no guarding.  Minimal tenderness to palpation suprapubic area.  Musculoskeletal: Normal range of motion.  Neurological: He is alert and oriented to person, place, and time. No cranial nerve deficit. He exhibits normal muscle tone. Coordination normal.  Skin: Skin is warm. No rash noted.    ED Course  Procedures (including critical care time) Labs Review Labs Reviewed  COMPREHENSIVE METABOLIC PANEL - Abnormal; Notable for the following:    GFR calc non Af Amer 73 (*)    GFR calc Af Amer 85 (*)    All other components within normal limits  LIPASE, BLOOD - Abnormal; Notable for the following:    Lipase 146 (*)    All other components within normal limits  CBC WITH DIFFERENTIAL - Abnormal; Notable for the following:    WBC 10.9 (*)    All other components within normal limits   Results for orders placed during the hospital encounter of 04/02/14  COMPREHENSIVE METABOLIC PANEL      Result Value Ref Range   Sodium 139  137 - 147 mEq/L   Potassium 4.6  3.7 - 5.3 mEq/L   Chloride 101  96 - 112 mEq/L   CO2 24  19 - 32 mEq/L   Glucose, Bld 98  70 - 99 mg/dL   BUN 18  6 - 23 mg/dL   Creatinine, Ser 1.16   0.50 - 1.35 mg/dL   Calcium 9.0  8.4 - 10.5 mg/dL   Total Protein 7.3  6.0 - 8.3 g/dL   Albumin 3.8  3.5 - 5.2 g/dL   AST 31  0 - 37 U/L   ALT 37  0 - 53 U/L   Alkaline Phosphatase 60  39 - 117 U/L   Total Bilirubin 0.4  0.3 - 1.2 mg/dL   GFR calc non Af Amer 73 (*) >90 mL/min   GFR calc Af Amer 85 (*) >90 mL/min  LIPASE, BLOOD      Result Value Ref Range   Lipase 146 (*) 11 - 59 U/L  CBC WITH DIFFERENTIAL      Result Value Ref Range   WBC 10.9 (*) 4.0 - 10.5 K/uL  RBC 4.90  4.22 - 5.81 MIL/uL   Hemoglobin 15.1  13.0 - 17.0 g/dL   HCT 43.2  39.0 - 52.0 %   MCV 88.2  78.0 - 100.0 fL   MCH 30.8  26.0 - 34.0 pg   MCHC 35.0  30.0 - 36.0 g/dL   RDW 12.6  11.5 - 15.5 %   Platelets 293  150 - 400 K/uL   Neutrophils Relative % 67  43 - 77 %   Neutro Abs 7.2  1.7 - 7.7 K/uL   Lymphocytes Relative 22  12 - 46 %   Lymphs Abs 2.4  0.7 - 4.0 K/uL   Monocytes Relative 9  3 - 12 %   Monocytes Absolute 1.0  0.1 - 1.0 K/uL   Eosinophils Relative 2  0 - 5 %   Eosinophils Absolute 0.2  0.0 - 0.7 K/uL   Basophils Relative 1  0 - 1 %   Basophils Absolute 0.1  0.0 - 0.1 K/uL     Imaging Review Ct Abdomen Pelvis W Contrast  04/02/2014   CLINICAL DATA:  Abdominal pain. Recent hospitalization for diverticulitis.  EXAM: CT ABDOMEN AND PELVIS WITH CONTRAST  TECHNIQUE: Multidetector CT imaging of the abdomen and pelvis was performed using the standard protocol following bolus administration of intravenous contrast.  CONTRAST:  100 mL of Omnipaque 300.  COMPARISON:  CT of the abdomen and pelvis 03/17/2014.  FINDINGS: Lung Bases: Severe calcifications of the aortic valve. Circumferential thickening of the distal third of the esophagus.  Abdomen/Pelvis: Numerous colonic diverticulae are again noted, and in the region of the proximal sigmoid colon there is marked colonic wall thickening and some pericolonic inflammatory changes in the associated mesocolon, compatible with residual diverticulitis. No definite  diverticular abscess or findings to suggest frank perforation are noted at this time. Numerous reactive sized lymph nodes are noted in the sigmoid mesocolon.  The appearance of the liver, gallbladder, pancreas, spleen, bilateral adrenal glands and the right kidney is unremarkable. 2 mm nonobstructive calculus in the upper pole collecting system of the left kidney. No significant volume of ascites. No pneumoperitoneum. No pathologic distention of small bowel. Prostate gland and urinary bladder are unremarkable in appearance.  Musculoskeletal: There are no aggressive appearing lytic or blastic lesions noted in the visualized portions of the skeleton.  IMPRESSION: 1. Persistent diverticulitis in the proximal sigmoid colon, with some adjacent inflammatory changes and reactive lymphadenopathy in the sigmoid mesocolon. No signs of diverticular abscess or evidence to suggest frank perforation at this time. 2. Profound calcification of the aortic valve. Nonemergent echocardiographic correlation for evidence of underlying valvular dysfunction strongly suggested, as this degree of calcification is highly associated with aortic stenosis. 3. Circumferential thickening of the distal esophagus. A small hiatal hernia was noted on the prior study, presumably sliding-type hiatal hernia. This may simply reflect reflux esophagitis, however, if there is any clinical concern for Barrett's metaplasia or esophageal neoplasia, further evaluation with nonemergent endoscopy would be recommended. 4. 2 mm nonobstructive calculus in the upper pole collecting system of the left kidney.   Electronically Signed   By: Vinnie Langton M.D.   On: 04/02/2014 18:11     EKG Interpretation None      MDM   Final diagnoses:  Acute diverticulitis    Consideration was given for admission. The patient would prefer not to be admitted. Patient was admitted for diverticulitis March 31 was in the hospital for 6 days. Sent home on by mouth  antibiotics. And  all by mouth antibiotics for the past few days. Patient states he never felt 100% better. But he is improved from when he was admitted the last time. He's continued to have some suprapubic pain does radiate to the back area. Pain is probably 6/10 associated with nausea no vomiting no fever. Patient's had a history of diverticulitis in the past but it was about a year ago. Patient has referral to [facility] surgery is pending for next week. Repeat CT scan today shows persistence of diverticulitis no complicating factors. Patient is also afebrile. No significant leukocytosis. No significant electrolyte abnormalities. Lipase is elevated but not really having epigastric abdominal pain. Patient treated here with Unasyn patient had been treated at home in the past with Flagyl and Cipro. Will continue with Augmentin. Secondly patient could be considered a failure of outpatient treatment or perhaps this is a recurrence. Clinically seems to be a persistent small during and since patient has no complicating factors and labs are reasonable we'll give a trial of by mouth at home. Patient knows to return if not improved in 2 days or if worse at all. As stated patient we treated with a by mouth antibiotic Augmentin. 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 Patient's currently nontoxic no acute distress. No acute surgical abdominal process on exam.  In addition the CT head 2 other incidental findings one is some thickening of the distal esophagus which could represent Barrett's esophagus. Patient has been told that he has had this in the past. Patient given information and recommendation for upper GI. In addition patient's aortic valve shows evidence of calcification told that echocardiogram would be appropriate. Patient's initial followup will be with general surgery.    Mervin Kung, MD 04/02/14 2102

## 2014-04-02 NOTE — ED Notes (Signed)
Patient ok for POs per Dr Rogene Houston. Patent given 8oz OJ per patient request.

## 2014-04-04 ENCOUNTER — Encounter: Payer: Self-pay | Admitting: Internal Medicine

## 2014-04-06 ENCOUNTER — Telehealth (INDEPENDENT_AMBULATORY_CARE_PROVIDER_SITE_OTHER): Payer: Self-pay

## 2014-04-06 NOTE — Telephone Encounter (Signed)
Pt calling to be seen for acute diverticulitis.  Reminded him he has an appt with Dr. Ninfa Linden on 04/10/14.  He has not yet seen GI.  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  I encouraged him to try since we have not made much progress with attempting to get him scheduled for surgery.  Pt agreed.  Dr. Ninfa Linden made aware.

## 2014-04-10 ENCOUNTER — Ambulatory Visit (INDEPENDENT_AMBULATORY_CARE_PROVIDER_SITE_OTHER): Payer: Self-pay | Admitting: Surgery

## 2014-04-10 ENCOUNTER — Encounter (INDEPENDENT_AMBULATORY_CARE_PROVIDER_SITE_OTHER): Payer: Self-pay | Admitting: Surgery

## 2014-04-10 VITALS — BP 130/76 | HR 73 | Temp 97.8°F | Resp 14 | Ht 68.0 in | Wt 279.8 lb

## 2014-04-10 DIAGNOSIS — K5732 Diverticulitis of large intestine without perforation or abscess without bleeding: Secondary | ICD-10-CM

## 2014-04-10 MED ORDER — AMOXICILLIN-POT CLAVULANATE 875-125 MG PO TABS: 1.0000 | ORAL_TABLET | Freq: Two times a day (BID) | ORAL | Status: DC

## 2014-04-10 NOTE — Progress Notes (Signed)
Subjective:     Patient ID: Alec Kelly, male   DOB: 04-Feb-1966, 48 y.o.   MRN: [id number]  HPI He is here for a hospital visit status post a note her recent admission to the hospital for diverticulitis with abscess.  He was seen by Dr. Dalbert Batman during that hospitalization. I saw him last in June of 2014. He has had multiple bouts of diverticulitis and has had abscess drained several times. 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 He has been fairly noncompliant with this followup. He has still not seen a gastroenterologist which we have requested. Apparently, he now has an appointment in June with gastroenterology to consider endoscopy. He still has mild left lower quadrant discomfort. During his hospitalization, his CAT scan is also showed thickening of the distal esophagus as well as heavy calcifications on his aortic valve. He does report some mild chest pain or shortness of breath. He has no difficulty swallowing  Review of Systems     Objective:   Physical Exam On exam, he is morbidly obese. He has a significant systolic ejection murmur. His abdomen is morbidly obese with minimal tenderness today    Assessment:     Diverticulitis with multiple bouts and issues of noncompliance     Plan:     Again, I stressed the importance of him continuing his workup and having gastrology see him. I believe he needs both upper and lower endoscopy. I also believe he'll need preoperative cardiac evaluation regarding his possible aortic stenosis. I am changing him from Cipro and Flagyl to Augmentin. I will see him back after his endoscopy. As he has seen multiple partners in the group, he may also be worked in with either Dr. Dalbert Batman, Hassell Done, [place], or [place] who have also seen him if I not available and because of the patient's limited time in town.

## 2014-04-11 ENCOUNTER — Telehealth (INDEPENDENT_AMBULATORY_CARE_PROVIDER_SITE_OTHER): Payer: Self-pay

## 2014-04-11 DIAGNOSIS — K5732 Diverticulitis of large intestine without perforation or abscess without bleeding: Secondary | ICD-10-CM

## 2014-04-11 MED ORDER — CIPROFLOXACIN HCL 500 MG PO TABS: 500.0000 mg | ORAL_TABLET | Freq: Two times a day (BID) | ORAL | Status: AC

## 2014-04-11 MED ORDER — METRONIDAZOLE 500 MG PO TABS: 500.0000 mg | ORAL_TABLET | Freq: Three times a day (TID) | ORAL | Status: AC

## 2014-04-11 NOTE — Telephone Encounter (Signed)
Call in cipro 500 mg po bid and flagyl 500 mg po tid for 3 weeks

## 2014-04-11 NOTE — Telephone Encounter (Signed)
Pt called stating the Augmentin flares up his gout. He is requesting a different ABX. Please advise.Marland Kitchen

## 2014-04-11 NOTE — Telephone Encounter (Signed)
Call him in more cipro and flagyl then.  cipro 500mg   bid for 3 weeks, flagyl 500mg  tid for 3 weeks

## 2014-04-11 NOTE — Addendum Note (Signed)
Addended by: Carlene Coria on: 04/11/2014 04:40 PM   Modules accepted: Orders

## 2014-04-11 NOTE — Telephone Encounter (Signed)
Called pt with the switch in ABX. Called into [facility] on [address].

## 2014-04-13 ENCOUNTER — Other Ambulatory Visit (INDEPENDENT_AMBULATORY_CARE_PROVIDER_SITE_OTHER): Payer: Self-pay

## 2014-04-13 DIAGNOSIS — Z01810 Encounter for preprocedural cardiovascular examination: Secondary | ICD-10-CM

## 2014-04-14 ENCOUNTER — Telehealth (INDEPENDENT_AMBULATORY_CARE_PROVIDER_SITE_OTHER): Payer: Self-pay | Admitting: *Deleted

## 2014-04-14 NOTE — Telephone Encounter (Signed)
I called to make pt aware of his appt with Dr. Radford Pax (Cardiologist).  Pt asked me to call back and leave him a detailed message on his voicemail, I explained to the pt if his voicemail does not identify him, I could not leave a detailed message.  Pt said that it did identify him, so I called and it does not identify so I left pt a message to call the office back and ask for nursing office.  Please advise pt of appt below:  5.15.15 with Dr. Radford Pax @ [address] # [phone] Arrive at 7:50 a.m. For 8:00 appt.  Thanks!  Alec Kelly

## 2014-04-28 ENCOUNTER — Ambulatory Visit (INDEPENDENT_AMBULATORY_CARE_PROVIDER_SITE_OTHER): Payer: BC Managed Care – PPO | Admitting: Cardiology

## 2014-04-28 ENCOUNTER — Encounter: Payer: Self-pay | Admitting: Cardiology

## 2014-04-28 VITALS — BP 131/85 | HR 62 | Ht 68.0 in | Wt 279.4 lb

## 2014-04-28 DIAGNOSIS — I447 Left bundle-branch block, unspecified: Secondary | ICD-10-CM | POA: Insufficient documentation

## 2014-04-28 DIAGNOSIS — R0602 Shortness of breath: Secondary | ICD-10-CM

## 2014-04-28 DIAGNOSIS — I359 Nonrheumatic aortic valve disorder, unspecified: Secondary | ICD-10-CM | POA: Insufficient documentation

## 2014-04-28 NOTE — Progress Notes (Signed)
  Alec Kelly, [address] [place], [place]  [id number] Phone: [phone] Fax:  [phone]  Date:  04/28/2014   ID:  Alec Kelly, DOB [birth year], MRN [id number]  PCP:  No PCP Per Patient  Cardiologist:  Fransico Him, MD     History of Present Illness: Alec Kelly is a 48 y.o. male with a no prior cardiac history until he was admitted for a bout of diverticulitis with abscess and a chest CT showed severe calcifications of the AV.  He is now here for further evaluation.  He has never had a heart murmur in the past.  He has problems with DOE with walking.  He occasionally has left sided chest pain that is nonexertional and is sharp like a knife.  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  He denies any palpitations, dizziness or syncope.  He needs preop clearance for colonoscopy and possible abdominal surgery in June.   Wt Readings from Last 3 Encounters:  04/28/14 279 lb 6.4 oz (126.735 kg)  04/10/14 279 lb 12.8 oz (126.916 kg)  03/15/14 278 lb 14.1 oz (126.5 kg)     Past Medical History  Diagnosis Date  . Kidney stones   . Diverticulitis   . Diverticulitis of sigmoid colon 04/07/2013    With perforation  . Obesity, Class III, BMI 40-49.9 (morbid obesity) 04/07/2013  . 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   No current facility-administered medications for this visit.    Allergies:    Allergies  Allergen Reactions  . Ivp Dye [Iodinated Diagnostic Agents] Nausea And Vomiting    Social History:  The patient  reports that he has never smoked. He has never used smokeless tobacco. He reports that he drinks alcohol. He reports that he does not use illicit drugs.   Family History:  The patient's family history includes COPD in his mother; Cancer in his father; Diabetes in his mother; Heart disease in his father; Heart failure in his mother; Hyperlipidemia in his father and mother; Hypertension in his mother.   ROS:   Please see the history of present illness.      All other systems reviewed and negative.   PHYSICAL EXAM: VS:  BP 131/85  Pulse 62  Ht 5\' 8"  (1.727 m)  Wt 279 lb 6.4 oz (126.735 kg)  BMI 42.49 kg/m2 Well nourished, well developed, in no acute distress HEENT: normal Neck: no JVD Cardiac:  normal S1, S2; RRR; no murmur Lungs:  clear to auscultation bilaterally, no wheezing, rhonchi or rales Abd: soft, nontender, no hepatomegaly Ext: no edema Skin: warm and dry Neuro:  CNs 2-12 intact, no focal abnormalities noted  EKG:     NSR with LBBB  ASSESSMENNT AND PLAN:  1. LBBB by EKG 2. Severe calcifications of the AV by chest CT.  He has a late peaking systolic murmur at RUSB indicating at least moderate to severe AS by exam - check 2D echo 3. DOE with CRF including family history CAD, male sex and age >14 as well as LBBB on EKG. - Stress myoview is echo does not show severe AS.  If echo shows severe AS will proceed with left and right heart cath  Followup with me in 6 months  Signed, Fransico Him, MD 04/28/2014 8:36 AM

## 2014-04-28 NOTE — Patient Instructions (Signed)
Your physician recommends that you continue on your current medications as directed. Please refer to the Current Medication list given to you today.  Your physician has requested that you have an echocardiogram. Echocardiography is a painless test that uses sound waves to create images of your heart. It provides your doctor with information about the size and shape of your heart and how well your heart's chambers and valves are working. This procedure takes approximately one hour. There are no restrictions for this procedure.  Your physician wants you to follow-up in: 6 months with Dr Mallie Snooks will receive a reminder letter in the mail two months in advance. If you don't receive a letter, please call our office to schedule the follow-up appointment.

## 2014-05-03 ENCOUNTER — Inpatient Hospital Stay: Payer: Self-pay | Admitting: Internal Medicine

## 2014-05-09 ENCOUNTER — Ambulatory Visit (HOSPITAL_COMMUNITY)
Admission: RE | Admit: 2014-05-09 | Discharge: 2014-05-09 | Disposition: A | Discharge status: Home / Self Care | Payer: Self-pay | Source: Ambulatory Visit | Attending: Cardiology | Admitting: Cardiology

## 2014-05-09 DIAGNOSIS — I359 Nonrheumatic aortic valve disorder, unspecified: Secondary | ICD-10-CM

## 2014-05-09 NOTE — Progress Notes (Signed)
Patient has appointment with Dr. Radford Pax June /01/2014.

## 2014-05-09 NOTE — Progress Notes (Signed)
2D Echo Performed 05/09/2014    Kaci Freel, RCS  

## 2014-05-11 ENCOUNTER — Other Ambulatory Visit: Payer: Self-pay | Admitting: Cardiology

## 2014-05-11 ENCOUNTER — Other Ambulatory Visit: Payer: Self-pay

## 2014-05-11 DIAGNOSIS — I350 Nonrheumatic aortic (valve) stenosis: Secondary | ICD-10-CM

## 2014-05-11 DIAGNOSIS — I359 Nonrheumatic aortic valve disorder, unspecified: Secondary | ICD-10-CM

## 2014-05-11 MED ORDER — DIPHENHYDRAMINE HCL 25 MG PO CAPS: ORAL_CAPSULE | ORAL | Status: DC

## 2014-05-11 MED ORDER — FAMOTIDINE 20 MG PO TABS: ORAL_TABLET | ORAL | Status: DC

## 2014-05-11 MED ORDER — PREDNISONE 20 MG PO TABS: ORAL_TABLET | ORAL | Status: DC

## 2014-05-11 NOTE — H&P (Signed)
    [address], [address]  [place], [place] [address]  Phone: [phone]  Fax: [phone]  Date: 04/28/2014  ID: Alec Kelly, DOB 12/19/1965, MRN [id number]  PCP: No PCP Per Patient  Cardiologist: Merary Garguilo, MD  History of Present Illness:  Alec Kelly is a 47 y.o. male with a no prior cardiac history until he was admitted for a bout of diverticulitis with abscess and a chest CT showed severe calcifications of the AV. He is now here for further evaluation. He has never had a heart murmur in the past. He has problems with DOE with walking. He occasionally has left sided chest pain that is nonexertional and is sharp like a knife. He is a truck driver. He has chronic LE edema. He denies any palpitations, dizziness or syncope. He needs preop clearance for colonoscopy and possible abdominal surgery in June.  Wt Readings from Last 3 Encounters:   04/28/14  279 lb 6.4 oz (126.735 kg)   04/10/14  279 lb 12.8 oz (126.916 kg)   03/15/14  278 lb 14.1 oz (126.5 kg)    Past Medical History   Diagnosis  Date   .  Kidney stones    .  Diverticulitis    .  Diverticulitis of sigmoid colon  04/07/2013     With perforation   .  Obesity, Class III, BMI 40-49.9 (morbid obesity)  04/07/2013   .  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    No current facility-administered medications for this visit.   Allergies:  Allergies   Allergen  Reactions   .  Ivp Dye [Iodinated Diagnostic Agents]  Nausea And Vomiting   Social History: The patient reports that he has never smoked. He has never used smokeless tobacco. He reports that he drinks alcohol. He reports that he does not use illicit drugs.  Family History: The patient's family history includes COPD in his mother; Cancer in his father; Diabetes in his mother; Heart disease in his father; Heart failure in his mother; Hyperlipidemia in his father and mother; Hypertension in his mother.  ROS: Please see  the history of present illness. All other systems reviewed and negative.  PHYSICAL EXAM:  VS: BP 131/85  Pulse 62  Ht 5' 8" (1.727 m)  Wt 279 lb 6.4 oz (126.735 kg)  BMI 42.49 kg/m2  Well nourished, well developed, in no acute distress  HEENT: normal  Neck: no JVD  Cardiac: normal S1, S2; RRR; no murmur  Lungs: clear to auscultation bilaterally, no wheezing, rhonchi or rales  Abd: soft, nontender, no hepatomegaly  Ext: no edema  Skin: warm and dry  Neuro: CNs 2-12 intact, no focal abnormalities noted  EKG: NSR with LBBB  ASSESSMENNT AND PLAN:  1. LBBB by EKG 2. Severe calcifications of the AV by chest CT. He has a late peaking systolic murmur at RUSB indicating at least moderate to severe AS by exam - check 2D echo  3. DOE with CRF including family history CAD, male sex and age >40 as well as LBBB on EKG. - Stress myoview is echo does not show severe AS. If echo shows severe AS will proceed with left and right heart cath   Signed,  Nyquan Selbe, MD  Addendum:  2D echo shows severe LV dysfunction with severe AS - will set up for right and left heart catheterization. 

## 2014-05-12 ENCOUNTER — Encounter (HOSPITAL_COMMUNITY): Payer: Self-pay | Admitting: Pharmacy Technician

## 2014-05-12 ENCOUNTER — Other Ambulatory Visit (INDEPENDENT_AMBULATORY_CARE_PROVIDER_SITE_OTHER): Payer: Self-pay

## 2014-05-12 ENCOUNTER — Ambulatory Visit
Admission: RE | Admit: 2014-05-12 | Discharge: 2014-05-12 | Disposition: A | Discharge status: Home / Self Care | Payer: Self-pay | Source: Ambulatory Visit | Attending: Cardiology | Admitting: Cardiology

## 2014-05-12 DIAGNOSIS — I359 Nonrheumatic aortic valve disorder, unspecified: Secondary | ICD-10-CM

## 2014-05-12 LAB — CBC WITH DIFFERENTIAL/PLATELET
BASOS PCT: 0.5 % (ref 0.0–3.0)
Basophils Absolute: 0 10*3/uL (ref 0.0–0.1)
EOS PCT: 2.4 % (ref 0.0–5.0)
Eosinophils Absolute: 0.2 10*3/uL (ref 0.0–0.7)
HEMATOCRIT: 40.8 % (ref 39.0–52.0)
HEMOGLOBIN: 14.1 g/dL (ref 13.0–17.0)
LYMPHS ABS: 1.8 10*3/uL (ref 0.7–4.0)
Lymphocytes Relative: 24.8 % (ref 12.0–46.0)
MCHC: 34.4 g/dL (ref 30.0–36.0)
MCV: 89.7 fl (ref 78.0–100.0)
MONO ABS: 0.5 10*3/uL (ref 0.1–1.0)
Monocytes Relative: 7.3 % (ref 3.0–12.0)
NEUTROS ABS: 4.8 10*3/uL (ref 1.4–7.7)
Neutrophils Relative %: 65 % (ref 43.0–77.0)
Platelets: 255 10*3/uL (ref 150.0–400.0)
RBC: 4.55 Mil/uL (ref 4.22–5.81)
RDW: 13.2 % (ref 11.5–15.5)
WBC: 7.4 10*3/uL (ref 4.0–10.5)

## 2014-05-12 LAB — BASIC METABOLIC PANEL
BUN: 12 mg/dL (ref 6–23)
CHLORIDE: 104 meq/L (ref 96–112)
CO2: 28 meq/L (ref 19–32)
Calcium: 9 mg/dL (ref 8.4–10.5)
Creatinine, Ser: 1.3 mg/dL (ref 0.4–1.5)
GFR: 64.37 mL/min (ref >60.00)
Glucose, Bld: 89 mg/dL (ref 70–99)
POTASSIUM: 4 meq/L (ref 3.5–5.1)
Sodium: 139 mEq/L (ref 135–145)

## 2014-05-12 LAB — PROTIME-INR
INR: 1 ratio (ref 0.8–1.0)
PROTHROMBIN TIME: 11.4 s (ref 9.6–13.1)

## 2014-05-15 ENCOUNTER — Ambulatory Visit (HOSPITAL_COMMUNITY)
Admission: RE | Admit: 2014-05-15 | Discharge: 2014-05-15 | Disposition: A | Discharge status: Home / Self Care | Payer: Self-pay | Source: Ambulatory Visit | Attending: Cardiology | Admitting: Cardiology

## 2014-05-15 ENCOUNTER — Encounter (HOSPITAL_COMMUNITY)
Admission: RE | Disposition: A | Discharge status: Home / Self Care | Payer: Self-pay | Source: Ambulatory Visit | Attending: Cardiology

## 2014-05-15 DIAGNOSIS — I359 Nonrheumatic aortic valve disorder, unspecified: Secondary | ICD-10-CM | POA: Insufficient documentation

## 2014-05-15 DIAGNOSIS — I712 Thoracic aortic aneurysm, without rupture, unspecified: Secondary | ICD-10-CM | POA: Insufficient documentation

## 2014-05-15 DIAGNOSIS — Z6841 Body Mass Index (BMI) 40.0 and over, adult: Secondary | ICD-10-CM | POA: Insufficient documentation

## 2014-05-15 DIAGNOSIS — I350 Nonrheumatic aortic (valve) stenosis: Secondary | ICD-10-CM

## 2014-05-15 DIAGNOSIS — I447 Left bundle-branch block, unspecified: Secondary | ICD-10-CM | POA: Insufficient documentation

## 2014-05-15 DIAGNOSIS — I2789 Other specified pulmonary heart diseases: Secondary | ICD-10-CM | POA: Insufficient documentation

## 2014-05-15 DIAGNOSIS — Z8249 Family history of ischemic heart disease and other diseases of the circulatory system: Secondary | ICD-10-CM | POA: Insufficient documentation

## 2014-05-15 DIAGNOSIS — Q231 Congenital insufficiency of aortic valve: Secondary | ICD-10-CM | POA: Insufficient documentation

## 2014-05-15 HISTORY — PX: CARDIAC CATHETERIZATION: SHX172

## 2014-05-15 HISTORY — PX: LEFT AND RIGHT HEART CATHETERIZATION WITH CORONARY ANGIOGRAM: SHX5449

## 2014-05-15 LAB — POCT I-STAT 3, ART BLOOD GAS (G3+)
Acid-base deficit: 3 mmol/L — ABNORMAL HIGH (ref 0.0–2.0)
BICARBONATE: 22.4 meq/L (ref 20.0–24.0)
O2 Saturation: 98 %
PCO2 ART: 38.7 mmHg (ref 35.0–45.0)
TCO2: 24 mmol/L (ref 0–100)
pH, Arterial: 7.371 (ref 7.350–7.450)
pO2, Arterial: 100 mmHg (ref 80.0–100.0)

## 2014-05-15 LAB — POCT I-STAT 3, VENOUS BLOOD GAS (G3P V)
Acid-base deficit: 1 mmol/L (ref 0.0–2.0)
BICARBONATE: 24.3 meq/L — AB (ref 20.0–24.0)
O2 Saturation: 71 %
TCO2: 26 mmol/L (ref 0–100)
pCO2, Ven: 42.6 mmHg — ABNORMAL LOW (ref 45.0–50.0)
pH, Ven: 7.363 — ABNORMAL HIGH (ref 7.250–7.300)
pO2, Ven: 38 mmHg (ref 30.0–45.0)

## 2014-05-15 LAB — POCT ACTIVATED CLOTTING TIME: ACTIVATED CLOTTING TIME: 182 s

## 2014-05-15 SURGERY — LEFT AND RIGHT HEART CATHETERIZATION WITH CORONARY ANGIOGRAM
Anesthesia: LOCAL

## 2014-05-15 MED ORDER — SODIUM CHLORIDE 0.9 % IV SOLN: 250.0000 mL | INTRAVENOUS | Status: DC | PRN

## 2014-05-15 MED ORDER — SODIUM CHLORIDE 0.9 % IJ SOLN: 3.0000 mL | INTRAMUSCULAR | Status: DC | PRN

## 2014-05-15 MED ORDER — SODIUM CHLORIDE 0.9 % IJ SOLN: 3.0000 mL | Freq: Two times a day (BID) | INTRAMUSCULAR | Status: DC

## 2014-05-15 MED ORDER — NITROGLYCERIN 0.2 MG/ML ON CALL CATH LAB
INTRAVENOUS | Status: AC
  Filled 2014-05-15: qty 1

## 2014-05-15 MED ORDER — VERAPAMIL HCL 2.5 MG/ML IV SOLN
INTRAVENOUS | Status: AC
  Filled 2014-05-15: qty 2

## 2014-05-15 MED ORDER — SODIUM CHLORIDE 0.9 % IV SOLN
INTRAVENOUS | Status: DC
  Administered 2014-05-15: 1000 mL via INTRAVENOUS

## 2014-05-15 MED ORDER — HEPARIN SODIUM (PORCINE) 1000 UNIT/ML IJ SOLN
INTRAMUSCULAR | Status: AC
  Filled 2014-05-15: qty 1

## 2014-05-15 MED ORDER — HEPARIN (PORCINE) IN NACL 2-0.9 UNIT/ML-% IJ SOLN
INTRAMUSCULAR | Status: AC
  Filled 2014-05-15: qty 1500

## 2014-05-15 MED ORDER — LIDOCAINE HCL (PF) 1 % IJ SOLN
INTRAMUSCULAR | Status: AC
  Filled 2014-05-15: qty 30

## 2014-05-15 MED ORDER — ASPIRIN 81 MG PO CHEW
81.0000 mg | CHEWABLE_TABLET | ORAL | Status: AC
  Administered 2014-05-15: 81 mg via ORAL
  Filled 2014-05-15: qty 1

## 2014-05-15 MED ORDER — MIDAZOLAM HCL 2 MG/2ML IJ SOLN
INTRAMUSCULAR | Status: AC
  Filled 2014-05-15: qty 2

## 2014-05-15 MED ORDER — FENTANYL CITRATE 0.05 MG/ML IJ SOLN
INTRAMUSCULAR | Status: AC
  Filled 2014-05-15: qty 2

## 2014-05-15 NOTE — H&P (View-Only) (Signed)
    [place], [place]  [place], [place] [address]  Phone: [phone]  Fax: [phone]  Date: 04/28/2014  ID: Alec Kelly, DOB 11-04-1966, MRN [id number]  PCP: No PCP Per Patient  Cardiologist: Fransico Him, MD  History of Present Illness:  Alec Kelly is a 48 y.o. male with a no prior cardiac history until he was admitted for a bout of diverticulitis with abscess and a chest CT showed severe calcifications of the AV. He is now here for further evaluation. He has never had a heart murmur in the past. He has problems with DOE with walking. He occasionally has left sided chest pain that is nonexertional and is sharp like a knife. 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 He denies any palpitations, dizziness or syncope. He needs preop clearance for colonoscopy and possible abdominal surgery in June.  Wt Readings from Last 3 Encounters:   04/28/14  279 lb 6.4 oz (126.735 kg)   04/10/14  279 lb 12.8 oz (126.916 kg)   03/15/14  278 lb 14.1 oz (126.5 kg)    Past Medical History   Diagnosis  Date   .  Kidney stones    .  Diverticulitis    .  Diverticulitis of sigmoid colon  04/07/2013     With perforation   .  Obesity, Class III, BMI 40-49.9 (morbid obesity)  04/07/2013   .  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    No current facility-administered medications for this visit.   Allergies:  Allergies   Allergen  Reactions   .  Ivp Dye [Iodinated Diagnostic Agents]  Nausea And Vomiting   Social History: The patient reports that he has never smoked. He has never used smokeless tobacco. He reports that he drinks alcohol. He reports that he does not use illicit drugs.  Family History: The patient's family history includes COPD in his mother; Cancer in his father; Diabetes in his mother; Heart disease in his father; Heart failure in his mother; Hyperlipidemia in his father and mother; Hypertension in his mother.  ROS: Please see  the history of present illness. All other systems reviewed and negative.  PHYSICAL EXAM:  VS: BP 131/85  Pulse 62  Ht 5\' 8"  (1.727 m)  Wt 279 lb 6.4 oz (126.735 kg)  BMI 42.49 kg/m2  Well nourished, well developed, in no acute distress  HEENT: normal  Neck: no JVD  Cardiac: normal S1, S2; RRR; no murmur  Lungs: clear to auscultation bilaterally, no wheezing, rhonchi or rales  Abd: soft, nontender, no hepatomegaly  Ext: no edema  Skin: warm and dry  Neuro: CNs 2-12 intact, no focal abnormalities noted  EKG: NSR with LBBB  ASSESSMENNT AND PLAN:  1. LBBB by EKG 2. Severe calcifications of the AV by chest CT. He has a late peaking systolic murmur at RUSB indicating at least moderate to severe AS by exam - check 2D echo  3. DOE with CRF including family history CAD, male sex and age >63 as well as LBBB on EKG. - Stress myoview is echo does not show severe AS. If echo shows severe AS will proceed with left and right heart cath   Signed,  Fransico Him, MD  Addendum:  2D echo shows severe LV dysfunction with severe AS - will set up for right and left heart catheterization.

## 2014-05-15 NOTE — Discharge Instructions (Signed)

## 2014-05-15 NOTE — Interval H&P Note (Signed)
History and Physical Interval Note:  05/15/2014 3:10 PM  Alec Kelly  has presented today for surgery, with the diagnosis of cp  The various methods of treatment have been discussed with the patient and family. After consideration of risks, benefits and other options for treatment, the patient has consented to  Procedure(s): LEFT AND RIGHT HEART CATHETERIZATION WITH CORONARY ANGIOGRAM (N/A) as a surgical intervention .  The patient's history has been reviewed, patient examined, no change in status, stable for surgery.  I have reviewed the patient's chart and labs.  Questions were answered to the patient's satisfaction.     Ander Slade [facility] 05/15/2014 3:10 PM Cath Lab Visit (complete for each Cath Lab visit)  Clinical Evaluation Leading to the Procedure:   ACS: no  Non-ACS:    Anginal Classification: CCS II  Anti-ischemic medical therapy: No Therapy  Non-Invasive Test Results: High-risk stress test findings: cardiac mortality >3%/year  Prior CABG: No previous CABG

## 2014-05-15 NOTE — CV Procedure (Signed)
    Cardiac Catheterization Procedure Note  Name: Alec Kelly MRN: [id number] DOB: [birth year]  Procedure: Right Heart Cath, Left Heart Cath, Selective Coronary Angiography, LV angiography, Aortic root angiography.  Indication: 48 yo WM with a bicuspid AV and severe aortic stenosis. EF 25-30%.    Procedural Details: The right wrist was prepped, draped, and anesthetized with 1% lidocaine. Using the modified Seldinger technique a 6 Fr slender sheath was placed in the right radial artery and a 5 French sheath was placed in the right brachial vein. A Swan-Ganz catheter was used for the right heart catheterization. Standard protocol was followed for recording of right heart pressures and sampling of oxygen saturations. Fick cardiac output was calculated. Standard Judkins catheters were used for selective coronary angiography and left ventriculography. There were no immediate procedural complications. The patient was transferred to the post catheterization recovery area for further monitoring.  Procedural Findings: Hemodynamics RA 24/22 mean 20 mm Hg RV 47/16 mm Hg PA 41/31 mean 35 mm Hg PCWP 27/25 mean 25 mm Hg LV 164/24 mm Hg AO 106/77 mean 95 mm Hg  AV gradient: Peak-58 mm Hg, mean 27 mm Hg AV area- 1.5 cm squared with index 0.6.   Oxygen saturations: PA 71% AO 98%  Cardiac Output (Fick) 6.0 L/min  Cardiac Index (Fick) 2.57 L/min/meter squared.   Coronary angiography: Coronary dominance: left  Left mainstem: Normal  Left anterior descending (LAD): Normal  Left circumflex (LCx): 20% ostial disease. Otherwise normal. Large dominant vessel.   Right coronary artery (RCA): small nondominant vessel. Normal.   Left ventriculography: Left ventricular systolic function is abnormal, LVEF is estimated at 25-30%. The LV is enlarged with global hypokinesis. There is no significant mitral regurgitation   Aortic root angiography: The AV is heavily calcified and deformed with reduced  mobility. 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  Final Conclusions:   1. Normal coronary anatomy. 2. Severe LV dysfunction. 3. Severe aortic stenosis. 4. Proximal aortic root aneurysm. 5. Mild pulmonary HTN with elevated LV filling pressures.   Recommendations: Referral to CT surgery for Bentall procedure.    Peter M Martinique, [place] 05/15/2014, 3:57 PM

## 2014-05-16 ENCOUNTER — Ambulatory Visit: Payer: Self-pay | Admitting: Cardiology

## 2014-05-16 ENCOUNTER — Telehealth: Payer: Self-pay | Admitting: *Deleted

## 2014-05-16 ENCOUNTER — Other Ambulatory Visit: Payer: Self-pay | Admitting: *Deleted

## 2014-05-16 DIAGNOSIS — I350 Nonrheumatic aortic (valve) stenosis: Secondary | ICD-10-CM

## 2014-05-16 NOTE — Telephone Encounter (Signed)
Recommendations: Referral to CT surgery for Bentall procedure.  Alec Kelly, [place]  05/15/2014, 3:57 PM   I called pt about lab results done last week. Pt was told after cath yesterday that he needed surgery on his heart valve. He was told an appt would be scheduled for him with a doctor to have this done. He has not heard anything about this yet.  . I will forward to Dr Radford Pax for recommendations.

## 2014-05-16 NOTE — Telephone Encounter (Signed)
Verified with pt that he was aware that the appointment with Dr. Cyndia Bent 6/3 at 2.  Advised Dr. Radford Pax.  Also advised pt that Dr. Radford Pax did not want him to return to work.  To discuss with Dr. Cyndia Bent tomorrow.

## 2014-05-16 NOTE — Telephone Encounter (Signed)
Please refer to Dr. Cyndia Bent for evaluation for AVR

## 2014-05-17 ENCOUNTER — Institutional Professional Consult (permissible substitution) (INDEPENDENT_AMBULATORY_CARE_PROVIDER_SITE_OTHER): Payer: Self-pay | Admitting: Surgery

## 2014-05-17 ENCOUNTER — Encounter: Payer: Self-pay | Admitting: Surgery

## 2014-05-17 ENCOUNTER — Other Ambulatory Visit: Payer: Self-pay | Admitting: *Deleted

## 2014-05-17 VITALS — BP 111/77 | HR 102 | Resp 16 | Ht 68.0 in | Wt 299.0 lb

## 2014-05-17 DIAGNOSIS — I350 Nonrheumatic aortic (valve) stenosis: Secondary | ICD-10-CM

## 2014-05-17 DIAGNOSIS — I719 Aortic aneurysm of unspecified site, without rupture: Secondary | ICD-10-CM

## 2014-05-17 DIAGNOSIS — I359 Nonrheumatic aortic valve disorder, unspecified: Secondary | ICD-10-CM

## 2014-05-18 ENCOUNTER — Encounter: Payer: Self-pay | Admitting: Surgery

## 2014-05-18 ENCOUNTER — Ambulatory Visit (INDEPENDENT_AMBULATORY_CARE_PROVIDER_SITE_OTHER): Payer: Self-pay | Admitting: Surgery

## 2014-05-18 ENCOUNTER — Ambulatory Visit (HOSPITAL_COMMUNITY)
Admission: RE | Admit: 2014-05-18 | Discharge: 2014-05-18 | Disposition: A | Discharge status: Home / Self Care | Payer: MEDICAID | Source: Ambulatory Visit | Attending: Surgery | Admitting: Surgery

## 2014-05-18 VITALS — BP 133/82 | HR 98 | Resp 20 | Ht 68.0 in | Wt 279.0 lb

## 2014-05-18 DIAGNOSIS — I359 Nonrheumatic aortic valve disorder, unspecified: Secondary | ICD-10-CM

## 2014-05-18 DIAGNOSIS — I77819 Aortic ectasia, unspecified site: Secondary | ICD-10-CM | POA: Insufficient documentation

## 2014-05-18 DIAGNOSIS — I719 Aortic aneurysm of unspecified site, without rupture: Secondary | ICD-10-CM | POA: Insufficient documentation

## 2014-05-18 DIAGNOSIS — I350 Nonrheumatic aortic (valve) stenosis: Secondary | ICD-10-CM

## 2014-05-18 MED ORDER — IOHEXOL 350 MG/ML SOLN
100.0000 mL | Freq: Once | INTRAVENOUS | Status: AC | PRN
  Administered 2014-05-18: 100 mL via INTRAVENOUS

## 2014-05-18 NOTE — Progress Notes (Signed)
PCP is No primary provider on file. Referring Provider is Sueanne Margarita, MD  Chief Complaint  Patient presents with  . Shortness of Breath    on exertion...aortic stenosis...ECHO 04/29/14.Marland KitchenMarland KitchenCATHED .Marland KitchenMarland Kitchen6/1/15...aortic root aneurysm    HPI:  The patient is a 48 year old gentleman with a strong family history of heart disease, a history of obesity and diverticulitis of the sigmoid colon who was admitted at the end of March with an episode and had an abdominal CT showing uncomplicated diverticulitis. It also showed extensive calcification of the aortic valve. He was having some symptoms of shortness of breath with exertion, nonexertional chest pain and chronic lower extremity edema and was referred to cardiology. A 2D echo on 5/26 showed a moderately thickened, severely calcified aortic valve that could be bicuspid with a mean gradient of 32 mm Hg and a peak of 52 mm Hg with an AVA of 1 cm2. There was no AI. There was severe LV dysfunction with an EF of 25-30%. Cardiac cath showed moderate pulmonary hypertension with PA pressure of 41/31, PCWP of 27/25 with a mean gradient of 27 and peak of 58. CI was 2.57. There was no significant coronary disease. The LV was dilated with global hypokinesis, no MR, and dilation of the aortic root and ascending aorta.  Past Medical History  Diagnosis Date  . Kidney stones   . Diverticulitis   . Diverticulitis of sigmoid colon 04/07/2013    With perforation  . Obesity, Class III, BMI 40-49.9 (morbid obesity) 04/07/2013  . 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 Tonsillectomy    . Appendectomy      74-71 years old - Dr. Lennie Hummer  . Hernia repair  05/21/8937    umbilical  - At [facility], Dr. Lennie Hummer, Repair of umbilical hernia with mesh.  . Lithotripsy      Family History  Problem Relation Age of Onset  . Heart failure Mother   . Diabetes Mother   . Hypertension Mother   . Hyperlipidemia Mother     . COPD Mother   . Cancer Father     lung & prostate  . Hyperlipidemia Father   . Heart disease Father   . Coronary artery disease Father     Social History History  Substance Use Topics  . Smoking status: Never Smoker   . Smokeless tobacco: Never Used  . Alcohol Use: Yes     Comment: social    Current Outpatient Prescriptions  Medication Sig Dispense Refill  . allopurinol (ZYLOPRIM) 100 MG tablet Take 100 mg by mouth daily as needed.       No current facility-administered medications for this visit.    Allergies  Allergen Reactions  . Ivp Dye [Iodinated Diagnostic Agents] Nausea And Vomiting    Review of Systems  Constitutional: Positive for fatigue and unexpected weight change.  HENT: Negative.  Negative for dental problem.   Eyes:       Floaters  Respiratory: Positive for cough and shortness of breath.   Cardiovascular: Positive for chest pain and leg swelling.  Gastrointestinal: Positive for blood in stool.  Endocrine: Negative.   Genitourinary:       Kidney stones  Allergic/Immunologic: Negative.   Neurological: Negative for dizziness, syncope and headaches.       Numbness in hands and feet  Hematological: Negative.   Psychiatric/Behavioral: Negative.     BP 111/77  Pulse  102  Resp 16  Ht 5\' 8"  (1.727 m)  Wt 299 lb (135.626 kg)  BMI 45.47 kg/m2  SpO2 97% Physical Exam  Constitutional: He is oriented to person, place, and time. He appears well-developed and well-nourished.  Obese white male in no distress  HENT:  Head: Normocephalic and atraumatic.  Mouth/Throat: Oropharynx is clear and moist.  Eyes: EOM are normal. Pupils are equal, round, and reactive to light.  Neck: Neck supple. No JVD present. No thyromegaly present.  Cardiovascular: Normal rate and regular rhythm.   Murmur heard. High pitched systolic murmur loudest along RSB but heard throughout the precordium and into both sides of the neck.  Pulmonary/Chest: Effort normal and breath  sounds normal. No respiratory distress. He has no rales.  Abdominal: Soft. Bowel sounds are normal. He exhibits no distension and no mass. There is no tenderness.  Musculoskeletal: Normal range of motion. He exhibits edema. He exhibits no tenderness.  Lymphadenopathy:    He has no cervical adenopathy.  Neurological: He is alert and oriented to person, place, and time. He has normal strength. No cranial nerve deficit or sensory deficit.  Skin: Skin is warm and dry.  Psychiatric: He has a normal mood and affect.    Diagnostic Tests:  *[facility], [place], [place] [address] [phone]  ------------------------------------------------------------------- Transthoracic Echocardiography  Patient: Alec Kelly, Alec Kelly MR #: [id number] Study Date: 05/09/2014 Gender: M Age: 42 Height: 172.7 cm Weight: 126.6 kg BSA: 2.53 m^2 Pt. Status: Room:  ATTENDING Fransico Him, MD ORDERING Fransico Him, MD REFERRING Fransico Him, MD SONOGRAPHER Marygrace Drought, RCS PERFORMING [facility], Outpatient  cc:  ------------------------------------------------------------------- LV EF: 25% - 30%  ------------------------------------------------------------------- Indications: 424.1 Aortic valve disorders.  ------------------------------------------------------------------- History: PMH: LBBB, SOB.  ------------------------------------------------------------------- Study Conclusions  - Left ventricle: The cavity size was normal. Wall thickness was increased in a pattern of moderate LVH. Systolic function was severely reduced. The estimated ejection fraction was in the range of 25% to 30%. Diffuse hypokinesis with asynchronous contraction secondary to bundle branch block. Features are consistent with a pseudonormal left ventricular filling pattern, with concomitant abnormal relaxation and increased filling pressure (grade 2 diastolic dysfunction). -  Aortic valve: A bicuspid morphology cannot be excluded; moderately thickened, severely calcified leaflets. Cusp separation was reduced. There was moderate stenosis. (Aortic stenosis may be underestimated as a result of low EF). Peak velocity (S): 360 cm/s. Mean gradient (S): 32 mm Hg. - Mitral valve: Calcified annulus.  -------------------------------------------------------------------  ------------------------------------------------------------------- Left ventricle: The cavity size was normal. Wall thickness was increased in a pattern of moderate LVH. Systolic function was severely reduced. The estimated ejection fraction was in the range of 25% to 30%. Diffuse hypokinesis with asynchronous contraction secondary to bundle branch block. Features are consistent with a pseudonormal left ventricular filling pattern, with concomitant abnormal relaxation and increased filling pressure (grade 2 diastolic dysfunction).  ------------------------------------------------------------------- Aortic valve: A bicuspid morphology cannot be excluded; moderately thickened, severely calcified leaflets. Cusp separation was reduced. Doppler: There was moderate stenosis. (Aortic stenosis may be underestimated as a result of low EF). There was no regurgitation. Valve area (VTI): 1 cm^2. Indexed valve area (VTI): 0.4 cm^2/m^2. Valve area (Vmax): 1 cm^2. Indexed valve area (Vmax): 0.4 cm^2/m^2. Mean gradient (S): 32 mm Hg. Peak gradient (S): 52 mm Hg.  ------------------------------------------------------------------- Mitral valve: Calcified annulus. Leaflet separation was normal. Doppler: Transvalvular velocity was within the normal range. There was no evidence for stenosis. There was trivial regurgitation. Peak gradient (D): 4 mm  Hg.  ------------------------------------------------------------------- Left atrium: LA volume/ BSA = 22.6  ml/m2.  ------------------------------------------------------------------- Right ventricle: The cavity size was normal. Wall thickness was normal. Systolic function was normal.  ------------------------------------------------------------------- Pulmonic valve: Structurally normal valve. Cusp separation was normal. Doppler: Transvalvular velocity was within the normal range. There was no regurgitation.  ------------------------------------------------------------------- Tricuspid valve: Structurally normal valve. Leaflet separation was normal. Doppler: Transvalvular velocity was within the normal range. There was trivial regurgitation.  ------------------------------------------------------------------- Pulmonary artery: The main pulmonary artery was normal-sized.  ------------------------------------------------------------------- Right atrium: The atrium was normal in size.  ------------------------------------------------------------------- Pericardium: The pericardium was normal in appearance. There was no pericardial effusion.  ------------------------------------------------------------------- Systemic veins: Inferior vena cava: The vessel was normal in size. The respirophasic diameter changes were in the normal range (= 50%), consistent with normal central venous pressure. Diameter: 17 mm.  ------------------------------------------------------------------- Prepared and Electronically Authenticated by  Candee Furbish, M.D. 2015-05-26T16:48:52  Cardiac Catheterization Procedure Note  Name: Alec Kelly  MRN: [id number]  DOB: 02-Jul-1966  Procedure: Right Heart Cath, Left Heart Cath, Selective Coronary Angiography, LV angiography, Aortic root angiography.  Indication: 48 yo WM with a bicuspid AV and severe aortic stenosis. EF 25-30%.  Procedural Details: The right wrist was prepped, draped, and anesthetized with 1% lidocaine. Using the modified Seldinger technique a 6  Fr slender sheath was placed in the right radial artery and a 5 French sheath was placed in the right brachial vein. A Swan-Ganz catheter was used for the right heart catheterization. Standard protocol was followed for recording of right heart pressures and sampling of oxygen saturations. Fick cardiac output was calculated. Standard Judkins catheters were used for selective coronary angiography and left ventriculography. There were no immediate procedural complications. The patient was transferred to the post catheterization recovery area for further monitoring.  Procedural Findings:  Hemodynamics  RA 24/22 mean 20 mm Hg  RV 47/16 mm Hg  PA 41/31 mean 35 mm Hg  PCWP 27/25 mean 25 mm Hg  LV 164/24 mm Hg  AO 106/77 mean 95 mm Hg  AV gradient: Peak-58 mm Hg, mean 27 mm Hg  AV area- 1.5 cm squared with index 0.6.  Oxygen saturations:  PA 71%  AO 98%  Cardiac Output (Fick) 6.0 L/min  Cardiac Index (Fick) 2.57 L/min/meter squared.  Coronary angiography:  Coronary dominance: left  Left mainstem: Normal  Left anterior descending (LAD): Normal  Left circumflex (LCx): 20% ostial disease. Otherwise normal. Large dominant vessel.  Right coronary artery (RCA): small nondominant vessel. Normal.  Left ventriculography: Left ventricular systolic function is abnormal, LVEF is estimated at 25-30%. The LV is enlarged with global hypokinesis. There is no significant mitral regurgitation  Aortic root angiography: The AV is heavily calcified and deformed with reduced mobility. 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  Final Conclusions:  1. Normal coronary anatomy.  2. Severe LV dysfunction.  3. Severe aortic stenosis.  4. Proximal aortic root aneurysm.  5. Mild pulmonary HTN with elevated LV filling pressures.  Recommendations: Referral to CT surgery for Bentall procedure.  Peter M Martinique, [place]  05/15/2014, 3:57 PM     Impression:  He has moderate aortic stenosis by gradient and  calculated valve area with severe LV dysfunction. His valve is very calcified and I suspect that he really has severe AS with low gradient due to low flow. He has some enlargement of the aortic root and ascending aorta by cath but this evaluation is incomplete. He may have a bicuspid valve which is more  likely to be associated with aortic aneurysm. I think AVR is indicated for this symptomatic patient with severe LV dysfunction. He will require a CTA of the chest to fully evaluated the aorta for aneurysm. I reviewed the echo and cath findings with him and his girlfriend. I discussed the pros and cons of mechanical and tissue valves with them. I would recommend a mechanical valve for him given his age of 39. A tissue valve would not last very long at his age. He does have a history of diverticulitis and may need surgery for that in the future but I think coumadin can be managed by him effectively and he is a reliable patient.   Plan:  I will schedule a CTA of the chest and will see him back afterwards to discuss the results with him and make final surgical plans.  I spent 80 minutes performing this consultation and > 50% of this time was spent face to face counseling and coordinating the care of this patient's severe aortic stenosis.

## 2014-05-18 NOTE — Progress Notes (Signed)
HPI:  The patient returns today to discuss the results of his CTA of the chest done today. This shows enlargement of the ascending aorta to 4.2 cm in the distal ascending aorta. The sinus segment is 3 cm and then the STJ enlarges to 3.7 cm. The proximal arch is also dilated to 4.2 cm and just beyond the left common carotid the aorta decreases to 2.4 cm. The descending aorta is 2.5 cm.  Current Outpatient Prescriptions  Medication Sig Dispense Refill  . allopurinol (ZYLOPRIM) 100 MG tablet Take 100 mg by mouth daily as needed.       No current facility-administered medications for this visit.     Physical Exam: BP 133/82  Pulse 98  Resp 20  Ht 5\' 8"  (1.727 m)  Wt 279 lb (126.554 kg)  BMI 42.43 kg/m2  SpO2 96% He looks well Cardiac exam shows a regular rate and rhythm with a harsh high-pitched systolic murmur heard throughout the precordium.  Diagnostic Tests:  CLINICAL DATA: Dilatation of the aortic root by cardiac  catheterization.  EXAM:  CT ANGIOGRAPHY CHEST WITH CONTRAST  TECHNIQUE:  Multidetector CT imaging of the chest was performed using the  standard protocol during bolus administration of intravenous  contrast. Multiplanar CT image reconstructions and MIPs were  obtained to evaluate the vascular anatomy.  CONTRAST: 121mL OMNIPAQUE IOHEXOL 350 MG/ML SOLN  COMPARISON: No prior cross-sectional imaging of the thoracic aorta.  Comparison made to CT of abdomen dated 04/02/2014.  FINDINGS:  The aortic valve and annulus are calcified. The aorta measures  approximately 3 cm at the level of the sinuses of Valsalva. Initial  tubular segment is not dilated and measures 3.7 cm. There is mild  aneurysmal dilatation of the distal ascending aorta up to a diameter  of 4.2 cm. The proximal arch is also dilated at 4.2 cm. The mid and  distal arch are normal caliber, measuring 2.4 cm. The descending  thoracic aorta measures 2.5 cm.  There is no evidence of intramural  hemorrhage or aortic dissection.  The proximal great vessels are well visualized and show normal  patency and branching anatomy. The pulmonary arteries are of normal  caliber. No pleural or pericardial fluid is identified. The heart  size is normal. No lung nodules or lymphadenopathy. Nonspecific  thickened appearance of the distal esophagus again noted and stable  without evidence of significant hiatal hernia. Correlation suggested  with any symptoms referable to the esophagus or history of  esophageal pathology.  The visualized upper abdominal structures are unremarkable.  Review of the MIP images confirms the above findings.  IMPRESSION:  1. Mild dilatation of the distal ascending thoracic aorta and  proximal arch, measuring 4.2 cm in diameter. No associated  dissection. There is associated calcification of the aortic valve  and annulus.  2. Persistent thickened appearance of the distal esophagus.  Electronically Signed  By: Aletta Edouard M.D.  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 He may have an associated bicuspid aortic valve. Since he is only 48 years old I would recommend replacing the aortic root, ascending aorta and proximal arch with grafts to the innominate and left common carotid artery. If this aorta is not replaced he will be at significantly increased risk of aortic dissection and progressive enlargement.  I discussed the operative procedure of Bentall procedure and replacement of the ascending aorta and aortic arch  with the patient and family including alternatives, benefits and risks; including but not limited to bleeding, blood transfusion, infection, stroke, myocardial infarction, graft failure, heart block requiring a permanent pacemaker, organ dysfunction, and death.  I reviewed the recommendation for a mechanical valve given his age. Joshu Furukawa Mcdougall  understands and agrees to proceed.  We will schedule surgery for Friday 06/02/2014  Plan:  Deneen Harts procedure and replacement of the ascending aorta and aortic arch on 06/02/2014.

## 2014-05-19 ENCOUNTER — Other Ambulatory Visit: Payer: Self-pay

## 2014-05-19 DIAGNOSIS — I712 Thoracic aortic aneurysm, without rupture: Secondary | ICD-10-CM

## 2014-05-19 DIAGNOSIS — I350 Nonrheumatic aortic (valve) stenosis: Secondary | ICD-10-CM

## 2014-05-19 DIAGNOSIS — I7121 Aneurysm of the ascending aorta, without rupture: Secondary | ICD-10-CM

## 2014-05-24 ENCOUNTER — Ambulatory Visit: Payer: BC Managed Care – PPO | Admitting: Internal Medicine

## 2014-05-26 ENCOUNTER — Encounter (HOSPITAL_COMMUNITY): Payer: Self-pay | Admitting: Pharmacy Technician

## 2014-05-26 ENCOUNTER — Encounter: Payer: Self-pay | Admitting: Cardiology

## 2014-05-31 ENCOUNTER — Encounter (HOSPITAL_COMMUNITY)
Admission: RE | Admit: 2014-05-31 | Discharge: 2014-05-31 | Disposition: A | Discharge status: Home / Self Care | Payer: Self-pay | Source: Ambulatory Visit | Attending: Surgery | Admitting: Surgery

## 2014-05-31 ENCOUNTER — Ambulatory Visit (HOSPITAL_COMMUNITY)
Admission: RE | Admit: 2014-05-31 | Discharge: 2014-05-31 | Disposition: A | Discharge status: Home / Self Care | Payer: Self-pay | Source: Ambulatory Visit | Attending: Surgery | Admitting: Surgery

## 2014-05-31 ENCOUNTER — Encounter (HOSPITAL_COMMUNITY): Payer: Self-pay

## 2014-05-31 VITALS — BP 119/84 | HR 79 | Temp 98.3°F | Resp 18 | Ht 68.0 in | Wt 282.2 lb

## 2014-05-31 DIAGNOSIS — I712 Thoracic aortic aneurysm, without rupture: Secondary | ICD-10-CM

## 2014-05-31 DIAGNOSIS — Z01810 Encounter for preprocedural cardiovascular examination: Secondary | ICD-10-CM

## 2014-05-31 DIAGNOSIS — I350 Nonrheumatic aortic (valve) stenosis: Secondary | ICD-10-CM

## 2014-05-31 DIAGNOSIS — I7121 Aneurysm of the ascending aorta, without rupture: Secondary | ICD-10-CM

## 2014-05-31 DIAGNOSIS — I359 Nonrheumatic aortic valve disorder, unspecified: Secondary | ICD-10-CM | POA: Insufficient documentation

## 2014-05-31 HISTORY — DX: Thoracic aortic aneurysm, without rupture: I71.2

## 2014-05-31 HISTORY — DX: Gastro-esophageal reflux disease without esophagitis: K21.9

## 2014-05-31 HISTORY — DX: Nonrheumatic aortic (valve) stenosis: I35.0

## 2014-05-31 HISTORY — DX: Claustrophobia: F40.240

## 2014-05-31 LAB — COMPREHENSIVE METABOLIC PANEL
ALT: 18 U/L (ref 0–53)
AST: 19 U/L (ref 0–37)
Albumin: 4 g/dL (ref 3.5–5.2)
Alkaline Phosphatase: 60 U/L (ref 39–117)
BUN: 14 mg/dL (ref 6–23)
CALCIUM: 9 mg/dL (ref 8.4–10.5)
CO2: 20 mEq/L (ref 19–32)
Chloride: 105 mEq/L (ref 96–112)
Creatinine, Ser: 1.15 mg/dL (ref 0.50–1.35)
GFR calc non Af Amer: 74 mL/min — ABNORMAL LOW (ref >90)
GFR, EST AFRICAN AMERICAN: 86 mL/min — AB (ref >90)
GLUCOSE: 90 mg/dL (ref 70–99)
Potassium: 4.5 mEq/L (ref 3.7–5.3)
Sodium: 140 mEq/L (ref 137–147)
TOTAL PROTEIN: 7.4 g/dL (ref 6.0–8.3)
Total Bilirubin: 0.5 mg/dL (ref 0.3–1.2)

## 2014-05-31 LAB — BLOOD GAS, ARTERIAL
Acid-base deficit: 0.2 mmol/L (ref 0.0–2.0)
Bicarbonate: 23.6 mEq/L (ref 20.0–24.0)
DRAWN BY: 344381
FIO2: 0.21 %
O2 SAT: 95.8 %
PATIENT TEMPERATURE: 98.6
TCO2: 24.7 mmol/L (ref 0–100)
pCO2 arterial: 36.6 mmHg (ref 35.0–45.0)
pH, Arterial: 7.425 (ref 7.350–7.450)
pO2, Arterial: 73.7 mmHg — ABNORMAL LOW (ref 80.0–100.0)

## 2014-05-31 LAB — URINALYSIS, ROUTINE W REFLEX MICROSCOPIC
BILIRUBIN URINE: NEGATIVE
Glucose, UA: NEGATIVE mg/dL
HGB URINE DIPSTICK: NEGATIVE
Ketones, ur: NEGATIVE mg/dL
Leukocytes, UA: NEGATIVE
Nitrite: NEGATIVE
Protein, ur: NEGATIVE mg/dL
Specific Gravity, Urine: 1.022 (ref 1.005–1.030)
Urobilinogen, UA: 0.2 mg/dL (ref 0.0–1.0)
pH: 5 (ref 5.0–8.0)

## 2014-05-31 LAB — CBC
HCT: 43.1 % (ref 39.0–52.0)
Hemoglobin: 15.1 g/dL (ref 13.0–17.0)
MCH: 30.9 pg (ref 26.0–34.0)
MCHC: 35 g/dL (ref 30.0–36.0)
MCV: 88.3 fL (ref 78.0–100.0)
PLATELETS: 248 10*3/uL (ref 150–400)
RBC: 4.88 MIL/uL (ref 4.22–5.81)
RDW: 13.2 % (ref 11.5–15.5)
WBC: 7.5 10*3/uL (ref 4.0–10.5)

## 2014-05-31 LAB — HEMOGLOBIN A1C
Hgb A1c MFr Bld: 5.5 % (ref <5.7)
MEAN PLASMA GLUCOSE: 111 mg/dL (ref <117)

## 2014-05-31 LAB — ABO/RH: ABO/RH(D): O POS

## 2014-05-31 LAB — APTT: APTT: 32 s (ref 24–37)

## 2014-05-31 LAB — TYPE AND SCREEN
ABO/RH(D): O POS
Antibody Screen: NEGATIVE

## 2014-05-31 LAB — PROTIME-INR
INR: 1.04 (ref 0.00–1.49)
PROTHROMBIN TIME: 13.4 s (ref 11.6–15.2)

## 2014-05-31 LAB — SURGICAL PCR SCREEN
MRSA, PCR: NEGATIVE
Staphylococcus aureus: NEGATIVE

## 2014-05-31 MED ORDER — ALBUTEROL SULFATE (2.5 MG/3ML) 0.083% IN NEBU
2.5000 mg | INHALATION_SOLUTION | Freq: Once | RESPIRATORY_TRACT | Status: AC
Start: 2014-05-31 — End: 2014-05-31
  Administered 2014-05-31: 2.5 mg via RESPIRATORY_TRACT

## 2014-05-31 NOTE — Pre-Procedure Instructions (Signed)
Alec Kelly  05/31/2014   Your procedure is scheduled on:  Friday, June 19  Report to [facility] Admitting at 0530 AM.  Call this number if you have problems the morning of surgery: [phone]   Remember:   Do not eat food or drink liquids after midnight.Thursday night   Take these medicines the morning of surgery with A SIP OF WATER: none   Do not wear jewelry.  Do not wear lotions, powders, or perfumes. Do not wear deodorant.day of surgery.  Do not shave 48 hours prior to surgery. Men may shave face and neck.  Do not bring valuables to the hospital.  [facility] is not responsible     for any belongings or valuables.               Contacts, dentures or bridgework may not be worn into surgery.  Leave suitcase in the car. After surgery it may be brought to your room.  For patients admitted to the hospital, discharge time is determined by your                treatment team.                Special Instructions: [place] - Preparing for Surgery  Before surgery, you can play an important role.  Because skin is not sterile, your skin needs to be as free of germs as possible.  You can reduce the number of germs on you skin by washing with CHG (chlorahexidine gluconate) soap before surgery.  CHG is an antiseptic cleaner which kills germs and bonds with the skin to continue killing germs even after washing.  Please DO NOT use if you have an allergy to CHG or antibacterial soaps.  If your skin becomes reddened/irritated stop using the CHG and inform your nurse when you arrive at Short Stay.  Do not shave (including legs and underarms) for at least 48 hours prior to the first CHG shower.  You may shave your face.  Please follow these instructions carefully:   1.  Shower with CHG Soap the night before surgery and the     morning of Surgery.  2.  If you choose to wash your hair, wash your hair first as usual with your   normal shampoo.  3.  After you shampoo, rinse your hair  and body thoroughly to remove the  Shampoo.  4.  Use CHG as you would any other liquid soap.  You can apply chg directly  to the skin and wash gently with scrungie or a clean washcloth.  5.  Apply the CHG Soap to your body ONLY FROM THE NECK DOWN.   Do not use on open wounds or open sores.  Avoid contact with your eyes,   ears, mouth and genitals (private parts).  Wash genitals (private parts)   with your normal soap.  6.  Wash thoroughly, paying special attention to the area where your surgery   will be performed.  7.  Thoroughly rinse your body with warm water from the neck down.  8.  DO NOT shower/wash with your normal soap after using and rinsing off   the CHG Soap.  9.  Pat yourself dry with a clean towel.            10.  Wear clean pajamas.            11.  Place clean sheets on your bed the night of your first shower  and do not   sleep with pets.  Day of Surgery  Do not apply any lotions/deoderants the morning of surgery.  Please wear clean clothes to the hospital/surgery center.     Please read over the following fact sheets that you were given: Pain Booklet, Coughing and Deep Breathing, Blood Transfusion Information, Open Heart Packet and Surgical Site Infection Prevention

## 2014-05-31 NOTE — Progress Notes (Signed)
STOP bang score 4; patent does not have a primary MD

## 2014-05-31 NOTE — Progress Notes (Signed)
Pre-op Cardiac Surgery  Carotid Findings:  Findings suggest 1-39% internal carotid artery stenosis bilaterally. Vertebral arteries are patent with antegrade flow.  Upper Extremity Right Left  Brachial Pressures 118-Triphasic 126-Triphasic  Radial Waveforms Triphasic Triphasic  Ulnar Waveforms Triphasic Triphasic  Palmar Arch (Allen's Test) Within normal limits. Within normal limits.     05/31/2014 1:58 PM Maudry Mayhew, RVT, RDCS, RDMS

## 2014-06-01 MED ORDER — MAGNESIUM SULFATE 50 % IJ SOLN
40.0000 meq | INTRAMUSCULAR | Status: DC
  Filled 2014-06-01: qty 10

## 2014-06-01 MED ORDER — DEXTROSE 5 % IV SOLN
1.5000 g | INTRAVENOUS | Status: AC
  Administered 2014-06-02: 1.5 g via INTRAVENOUS
  Administered 2014-06-02: .75 g via INTRAVENOUS
  Filled 2014-06-01 (×2): qty 1.5

## 2014-06-01 MED ORDER — DEXTROSE 5 % IV SOLN
750.0000 mg | INTRAVENOUS | Status: DC
  Filled 2014-06-01: qty 750

## 2014-06-01 MED ORDER — INSULIN REGULAR HUMAN 100 UNIT/ML IJ SOLN
INTRAMUSCULAR | Status: AC
  Administered 2014-06-02: 1.3 [IU]/h via INTRAVENOUS
  Administered 2014-06-02: 1 [IU]/h via INTRAVENOUS
  Filled 2014-06-01: qty 1

## 2014-06-01 MED ORDER — VANCOMYCIN HCL 10 G IV SOLR
1500.0000 mg | INTRAVENOUS | Status: AC
  Administered 2014-06-02: 1500 mg via INTRAVENOUS
  Filled 2014-06-01: qty 1500

## 2014-06-01 MED ORDER — SODIUM CHLORIDE 0.9 % IV SOLN
INTRAVENOUS | Status: DC
  Filled 2014-06-01: qty 30

## 2014-06-01 MED ORDER — POTASSIUM CHLORIDE 2 MEQ/ML IV SOLN
80.0000 meq | INTRAVENOUS | Status: DC
  Filled 2014-06-01: qty 40

## 2014-06-01 MED ORDER — PLASMA-LYTE 148 IV SOLN
INTRAVENOUS | Status: AC
  Administered 2014-06-02: 09:00:00
  Filled 2014-06-01: qty 2.5

## 2014-06-01 MED ORDER — METOPROLOL TARTRATE 12.5 MG HALF TABLET
12.5000 mg | ORAL_TABLET | Freq: Once | ORAL | Status: AC
  Administered 2014-06-02: 12.5 mg via ORAL
  Filled 2014-06-01: qty 1

## 2014-06-01 MED ORDER — DEXMEDETOMIDINE HCL IN NACL 400 MCG/100ML IV SOLN
0.1000 ug/kg/h | INTRAVENOUS | Status: AC
Start: 2014-06-02 — End: 2014-06-02
  Administered 2014-06-02 (×2): 0.3 ug/kg/h via INTRAVENOUS
  Filled 2014-06-01: qty 100

## 2014-06-01 MED ORDER — DEXTROSE 5 % IV SOLN
30.0000 ug/min | INTRAVENOUS | Status: AC
  Administered 2014-06-02: 30 ug/min via INTRAVENOUS
  Administered 2014-06-02: 15 ug/min via INTRAVENOUS
  Filled 2014-06-01: qty 2

## 2014-06-01 MED ORDER — SODIUM CHLORIDE 0.9 % IV SOLN
INTRAVENOUS | Status: DC
  Filled 2014-06-01: qty 40

## 2014-06-01 MED ORDER — EPINEPHRINE HCL 1 MG/ML IJ SOLN
0.5000 ug/min | INTRAVENOUS | Status: DC
  Filled 2014-06-01: qty 4

## 2014-06-01 MED ORDER — DOPAMINE-DEXTROSE 3.2-5 MG/ML-% IV SOLN
2.0000 ug/kg/min | INTRAVENOUS | Status: AC
  Administered 2014-06-02: 5 ug/kg/min via INTRAVENOUS
  Filled 2014-06-01: qty 250

## 2014-06-01 MED ORDER — NITROGLYCERIN IN D5W 200-5 MCG/ML-% IV SOLN
2.0000 ug/min | INTRAVENOUS | Status: DC
  Filled 2014-06-01: qty 250

## 2014-06-02 ENCOUNTER — Encounter (HOSPITAL_COMMUNITY): Payer: MEDICAID | Admitting: Certified Registered Nurse Anesthetist

## 2014-06-02 ENCOUNTER — Inpatient Hospital Stay (HOSPITAL_COMMUNITY): Payer: Self-pay | Admitting: Certified Registered Nurse Anesthetist

## 2014-06-02 ENCOUNTER — Encounter (HOSPITAL_COMMUNITY): Payer: Self-pay | Admitting: *Deleted

## 2014-06-02 ENCOUNTER — Inpatient Hospital Stay (HOSPITAL_COMMUNITY)
Admission: RE | Admit: 2014-06-02 | Discharge: 2014-06-13 | DRG: 220 | Disposition: A | Discharge status: Home / Self Care | Payer: Self-pay | Source: Ambulatory Visit | Attending: Surgery | Admitting: Surgery

## 2014-06-02 ENCOUNTER — Inpatient Hospital Stay (HOSPITAL_COMMUNITY): Payer: Self-pay

## 2014-06-02 ENCOUNTER — Encounter (HOSPITAL_COMMUNITY)
Admission: RE | Disposition: A | Discharge status: Home / Self Care | Payer: Self-pay | Source: Ambulatory Visit | Attending: Surgery

## 2014-06-02 DIAGNOSIS — I7121 Aneurysm of the ascending aorta, without rupture: Secondary | ICD-10-CM | POA: Diagnosis present

## 2014-06-02 DIAGNOSIS — Z6841 Body Mass Index (BMI) 40.0 and over, adult: Secondary | ICD-10-CM

## 2014-06-02 DIAGNOSIS — R509 Fever, unspecified: Secondary | ICD-10-CM | POA: Diagnosis not present

## 2014-06-02 DIAGNOSIS — N390 Urinary tract infection, site not specified: Secondary | ICD-10-CM | POA: Diagnosis present

## 2014-06-02 DIAGNOSIS — M25579 Pain in unspecified ankle and joints of unspecified foot: Secondary | ICD-10-CM | POA: Diagnosis present

## 2014-06-02 DIAGNOSIS — Q231 Congenital insufficiency of aortic valve: Principal | ICD-10-CM

## 2014-06-02 DIAGNOSIS — Z91041 Radiographic dye allergy status: Secondary | ICD-10-CM

## 2014-06-02 DIAGNOSIS — K5732 Diverticulitis of large intestine without perforation or abscess without bleeding: Secondary | ICD-10-CM | POA: Diagnosis present

## 2014-06-02 DIAGNOSIS — I359 Nonrheumatic aortic valve disorder, unspecified: Secondary | ICD-10-CM | POA: Diagnosis present

## 2014-06-02 DIAGNOSIS — J9819 Other pulmonary collapse: Secondary | ICD-10-CM | POA: Diagnosis not present

## 2014-06-02 DIAGNOSIS — E119 Type 2 diabetes mellitus without complications: Secondary | ICD-10-CM | POA: Diagnosis present

## 2014-06-02 DIAGNOSIS — D62 Acute posthemorrhagic anemia: Secondary | ICD-10-CM | POA: Diagnosis not present

## 2014-06-02 DIAGNOSIS — I350 Nonrheumatic aortic (valve) stenosis: Secondary | ICD-10-CM

## 2014-06-02 DIAGNOSIS — I447 Left bundle-branch block, unspecified: Secondary | ICD-10-CM | POA: Diagnosis present

## 2014-06-02 DIAGNOSIS — I712 Thoracic aortic aneurysm, without rupture, unspecified: Secondary | ICD-10-CM | POA: Diagnosis present

## 2014-06-02 DIAGNOSIS — I711 Thoracic aortic aneurysm, ruptured, unspecified: Secondary | ICD-10-CM

## 2014-06-02 DIAGNOSIS — M25569 Pain in unspecified knee: Secondary | ICD-10-CM | POA: Diagnosis not present

## 2014-06-02 DIAGNOSIS — E8779 Other fluid overload: Secondary | ICD-10-CM | POA: Diagnosis not present

## 2014-06-02 DIAGNOSIS — M25519 Pain in unspecified shoulder: Secondary | ICD-10-CM | POA: Diagnosis present

## 2014-06-02 DIAGNOSIS — M109 Gout, unspecified: Secondary | ICD-10-CM | POA: Diagnosis present

## 2014-06-02 HISTORY — PX: BENTALL PROCEDURE: SHX5058

## 2014-06-02 HISTORY — PX: ASCENDING AORTIC ROOT REPLACEMENT: SHX5729

## 2014-06-02 HISTORY — PX: INTRAOPERATIVE TRANSESOPHAGEAL ECHOCARDIOGRAM: SHX5062

## 2014-06-02 LAB — POCT I-STAT 3, ART BLOOD GAS (G3+)
ACID-BASE DEFICIT: 7 mmol/L — AB (ref 0.0–2.0)
ACID-BASE DEFICIT: 3 mmol/L — AB (ref 0.0–2.0)
Acid-Base Excess: 2 mmol/L (ref 0.0–2.0)
Acid-base deficit: 7 mmol/L — ABNORMAL HIGH (ref 0.0–2.0)
Acid-base deficit: 4 mmol/L — ABNORMAL HIGH (ref 0.0–2.0)
BICARBONATE: 22.9 meq/L (ref 20.0–24.0)
BICARBONATE: 23 meq/L (ref 20.0–24.0)
Bicarbonate: 27.6 mEq/L — ABNORMAL HIGH (ref 20.0–24.0)
Bicarbonate: 20.7 mEq/L (ref 20.0–24.0)
Bicarbonate: 21.8 mEq/L (ref 20.0–24.0)
O2 SAT: 99 %
O2 SAT: 100 %
O2 Saturation: 100 %
O2 Saturation: 98 %
O2 Saturation: 98 %
PCO2 ART: 49.6 mmHg — AB (ref 35.0–45.0)
PCO2 ART: 49.1 mmHg — AB (ref 35.0–45.0)
PCO2 ART: 56.7 mmHg — AB (ref 35.0–45.0)
PH ART: 7.354 (ref 7.350–7.450)
PH ART: 7.287 — AB (ref 7.350–7.450)
PH ART: 7.341 — AB (ref 7.350–7.450)
PO2 ART: 178 mmHg — AB (ref 80.0–100.0)
PO2 ART: 116 mmHg — AB (ref 80.0–100.0)
Patient temperature: 37.1
Patient temperature: 37.2
TCO2: 29 mmol/L (ref 0–100)
TCO2: 22 mmol/L (ref 0–100)
TCO2: 24 mmol/L (ref 0–100)
TCO2: 24 mmol/L (ref 0–100)
TCO2: 24 mmol/L (ref 0–100)
pCO2 arterial: 48.1 mmHg — ABNORMAL HIGH (ref 35.0–45.0)
pCO2 arterial: 42.7 mmHg (ref 35.0–45.0)
pH, Arterial: 7.233 — ABNORMAL LOW (ref 7.350–7.450)
pH, Arterial: 7.194 — CL (ref 7.350–7.450)
pO2, Arterial: 368 mmHg — ABNORMAL HIGH (ref 80.0–100.0)
pO2, Arterial: 259 mmHg — ABNORMAL HIGH (ref 80.0–100.0)
pO2, Arterial: 111 mmHg — ABNORMAL HIGH (ref 80.0–100.0)

## 2014-06-02 LAB — CBC
HCT: 34.9 % — ABNORMAL LOW (ref 39.0–52.0)
HEMOGLOBIN: 12 g/dL — AB (ref 13.0–17.0)
MCH: 30.5 pg (ref 26.0–34.0)
MCHC: 34.4 g/dL (ref 30.0–36.0)
MCV: 88.6 fL (ref 78.0–100.0)
PLATELETS: 176 10*3/uL (ref 150–400)
RBC: 3.94 MIL/uL — AB (ref 4.22–5.81)
RDW: 13 % (ref 11.5–15.5)
WBC: 18.3 10*3/uL — AB (ref 4.0–10.5)

## 2014-06-02 LAB — POCT I-STAT 4, (NA,K, GLUC, HGB,HCT)
GLUCOSE: 44 mg/dL — AB (ref 70–99)
GLUCOSE: 119 mg/dL — AB (ref 70–99)
GLUCOSE: 139 mg/dL — AB (ref 70–99)
GLUCOSE: 189 mg/dL — AB (ref 70–99)
Glucose, Bld: 103 mg/dL — ABNORMAL HIGH (ref 70–99)
Glucose, Bld: 113 mg/dL — ABNORMAL HIGH (ref 70–99)
Glucose, Bld: 108 mg/dL — ABNORMAL HIGH (ref 70–99)
Glucose, Bld: 142 mg/dL — ABNORMAL HIGH (ref 70–99)
Glucose, Bld: 148 mg/dL — ABNORMAL HIGH (ref 70–99)
Glucose, Bld: 144 mg/dL — ABNORMAL HIGH (ref 70–99)
HCT: 11 % — ABNORMAL LOW (ref 39.0–52.0)
HCT: 34 % — ABNORMAL LOW (ref 39.0–52.0)
HCT: 28 % — ABNORMAL LOW (ref 39.0–52.0)
HCT: 26 % — ABNORMAL LOW (ref 39.0–52.0)
HCT: 35 % — ABNORMAL LOW (ref 39.0–52.0)
HEMATOCRIT: 38 % — AB (ref 39.0–52.0)
HEMATOCRIT: 26 % — AB (ref 39.0–52.0)
HEMATOCRIT: 28 % — AB (ref 39.0–52.0)
HEMATOCRIT: 27 % — AB (ref 39.0–52.0)
HEMATOCRIT: 29 % — AB (ref 39.0–52.0)
HEMOGLOBIN: 12.9 g/dL — AB (ref 13.0–17.0)
HEMOGLOBIN: 9.5 g/dL — AB (ref 13.0–17.0)
HEMOGLOBIN: 8.8 g/dL — AB (ref 13.0–17.0)
HEMOGLOBIN: 8.8 g/dL — AB (ref 13.0–17.0)
HEMOGLOBIN: 11.9 g/dL — AB (ref 13.0–17.0)
Hemoglobin: 11.6 g/dL — ABNORMAL LOW (ref 13.0–17.0)
Hemoglobin: 3.7 g/dL — CL (ref 13.0–17.0)
Hemoglobin: 9.5 g/dL — ABNORMAL LOW (ref 13.0–17.0)
Hemoglobin: 9.2 g/dL — ABNORMAL LOW (ref 13.0–17.0)
Hemoglobin: 9.9 g/dL — ABNORMAL LOW (ref 13.0–17.0)
POTASSIUM: 5.8 meq/L — AB (ref 3.7–5.3)
POTASSIUM: 5.9 meq/L — AB (ref 3.7–5.3)
Potassium: 4.5 mEq/L (ref 3.7–5.3)
Potassium: 4.2 mEq/L (ref 3.7–5.3)
Potassium: 4.4 mEq/L (ref 3.7–5.3)
Potassium: 5.5 mEq/L — ABNORMAL HIGH (ref 3.7–5.3)
Potassium: 6.2 mEq/L — ABNORMAL HIGH (ref 3.7–5.3)
Potassium: 5 mEq/L (ref 3.7–5.3)
Potassium: 5.1 mEq/L (ref 3.7–5.3)
SODIUM: 155 meq/L — AB (ref 137–147)
SODIUM: 135 meq/L — AB (ref 137–147)
SODIUM: 136 meq/L — AB (ref 137–147)
SODIUM: 133 meq/L — AB (ref 137–147)
Sodium: 141 mEq/L (ref 137–147)
Sodium: 140 mEq/L (ref 137–147)
Sodium: 136 mEq/L — ABNORMAL LOW (ref 137–147)
Sodium: 134 mEq/L — ABNORMAL LOW (ref 137–147)
Sodium: 137 mEq/L (ref 137–147)
Sodium: 139 mEq/L (ref 137–147)

## 2014-06-02 LAB — POCT I-STAT 7, (LYTES, BLD GAS, ICA,H+H)
Acid-base deficit: 7 mmol/L — ABNORMAL HIGH (ref 0.0–2.0)
Bicarbonate: 21 mEq/L (ref 20.0–24.0)
Calcium, Ion: 1.2 mmol/L (ref 1.12–1.23)
HCT: 34 % — ABNORMAL LOW (ref 39.0–52.0)
Hemoglobin: 11.6 g/dL — ABNORMAL LOW (ref 13.0–17.0)
O2 SAT: 99 %
POTASSIUM: 4.7 meq/L (ref 3.7–5.3)
Patient temperature: 37.4
SODIUM: 137 meq/L (ref 137–147)
TCO2: 23 mmol/L (ref 0–100)
pCO2 arterial: 52 mmHg — ABNORMAL HIGH (ref 35.0–45.0)
pH, Arterial: 7.217 — ABNORMAL LOW (ref 7.350–7.450)
pO2, Arterial: 201 mmHg — ABNORMAL HIGH (ref 80.0–100.0)

## 2014-06-02 LAB — PLATELET COUNT: Platelets: 139 10*3/uL — ABNORMAL LOW (ref 150–400)

## 2014-06-02 LAB — PROTIME-INR
INR: 1.43 (ref 0.00–1.49)
Prothrombin Time: 17.1 seconds — ABNORMAL HIGH (ref 11.6–15.2)

## 2014-06-02 LAB — HEMOGLOBIN AND HEMATOCRIT, BLOOD
HCT: 27.2 % — ABNORMAL LOW (ref 39.0–52.0)
Hemoglobin: 9.6 g/dL — ABNORMAL LOW (ref 13.0–17.0)

## 2014-06-02 LAB — APTT: APTT: 33 s (ref 24–37)

## 2014-06-02 LAB — GLUCOSE, CAPILLARY: Glucose-Capillary: 154 mg/dL — ABNORMAL HIGH (ref 70–99)

## 2014-06-02 SURGERY — BENTALL PROCEDURE
Anesthesia: General | Site: Chest

## 2014-06-02 MED ORDER — FENTANYL CITRATE 0.05 MG/ML IJ SOLN
INTRAMUSCULAR | Status: AC
  Filled 2014-06-02: qty 5

## 2014-06-02 MED ORDER — SODIUM BICARBONATE 8.4 % IV SOLN
100.0000 meq | Freq: Once | INTRAVENOUS | Status: AC
  Administered 2014-06-02: 100 meq via INTRAVENOUS
  Filled 2014-06-02: qty 100

## 2014-06-02 MED ORDER — DEXTROSE 5 % IV SOLN
INTRAVENOUS | Status: DC | PRN
  Administered 2014-06-02: 08:00:00 via INTRAVENOUS

## 2014-06-02 MED ORDER — ACETAMINOPHEN 160 MG/5ML PO SOLN
1000.0000 mg | Freq: Four times a day (QID) | ORAL | Status: DC
  Filled 2014-06-02: qty 40

## 2014-06-02 MED ORDER — DOPAMINE-DEXTROSE 3.2-5 MG/ML-% IV SOLN
0.0000 ug/kg/min | INTRAVENOUS | Status: DC
Start: 2014-06-02 — End: 2014-06-05
  Filled 2014-06-02 (×2): qty 250

## 2014-06-02 MED ORDER — CALCIUM CHLORIDE 10 % IV SOLN
INTRAVENOUS | Status: DC | PRN
  Administered 2014-06-02: 1 g via INTRAVENOUS

## 2014-06-02 MED ORDER — ASPIRIN 81 MG PO CHEW: 324.0000 mg | CHEWABLE_TABLET | Freq: Every day | ORAL | Status: DC

## 2014-06-02 MED ORDER — VECURONIUM BROMIDE 10 MG IV SOLR
INTRAVENOUS | Status: AC
  Filled 2014-06-02: qty 10

## 2014-06-02 MED ORDER — MILRINONE IN DEXTROSE 20 MG/100ML IV SOLN
0.1250 ug/kg/min | INTRAVENOUS | Status: DC
  Filled 2014-06-02: qty 100

## 2014-06-02 MED ORDER — ACETAMINOPHEN 500 MG PO TABS
1000.0000 mg | ORAL_TABLET | Freq: Four times a day (QID) | ORAL | Status: DC
  Administered 2014-06-03 – 2014-06-05 (×8): 1000 mg via ORAL
  Filled 2014-06-02 (×13): qty 2

## 2014-06-02 MED ORDER — PROTAMINE SULFATE 10 MG/ML IV SOLN
INTRAVENOUS | Status: AC
  Filled 2014-06-02: qty 50

## 2014-06-02 MED ORDER — MIDAZOLAM HCL 5 MG/5ML IJ SOLN
INTRAMUSCULAR | Status: DC | PRN
  Administered 2014-06-02 (×3): 2 mg via INTRAVENOUS
  Administered 2014-06-02: 4 mg via INTRAVENOUS
  Administered 2014-06-02: 2 mg via INTRAVENOUS
  Administered 2014-06-02: 3 mg via INTRAVENOUS
  Administered 2014-06-02 (×2): 2 mg via INTRAVENOUS
  Administered 2014-06-02: 1 mg via INTRAVENOUS

## 2014-06-02 MED ORDER — VANCOMYCIN HCL IN DEXTROSE 1-5 GM/200ML-% IV SOLN
1000.0000 mg | Freq: Once | INTRAVENOUS | Status: AC
  Administered 2014-06-02: 1000 mg via INTRAVENOUS
  Filled 2014-06-02 (×2): qty 200

## 2014-06-02 MED ORDER — BISACODYL 10 MG RE SUPP: 10.0000 mg | Freq: Every day | RECTAL | Status: DC

## 2014-06-02 MED ORDER — SODIUM CHLORIDE 0.9 % IV SOLN: 250.0000 mL | INTRAVENOUS | Status: DC

## 2014-06-02 MED ORDER — ONDANSETRON HCL 4 MG/2ML IJ SOLN: 4.0000 mg | Freq: Four times a day (QID) | INTRAMUSCULAR | Status: DC | PRN

## 2014-06-02 MED ORDER — STERILE WATER FOR INJECTION IJ SOLN
INTRAMUSCULAR | Status: AC
  Filled 2014-06-02: qty 10

## 2014-06-02 MED ORDER — MIDAZOLAM HCL 2 MG/2ML IJ SOLN
INTRAMUSCULAR | Status: AC
  Filled 2014-06-02: qty 2

## 2014-06-02 MED ORDER — STERILE WATER FOR INJECTION IJ SOLN
INTRAMUSCULAR | Status: AC
  Filled 2014-06-02: qty 20

## 2014-06-02 MED ORDER — LACTATED RINGERS IV SOLN
INTRAVENOUS | Status: DC | PRN
  Administered 2014-06-02 (×2): via INTRAVENOUS

## 2014-06-02 MED ORDER — LACTATED RINGERS IV SOLN: 500.0000 mL | Freq: Once | INTRAVENOUS | Status: AC | PRN

## 2014-06-02 MED ORDER — ACETAMINOPHEN 160 MG/5ML PO SOLN: 650.0000 mg | Freq: Once | ORAL | Status: AC

## 2014-06-02 MED ORDER — PROPOFOL 10 MG/ML IV BOLUS
INTRAVENOUS | Status: AC
  Filled 2014-06-02: qty 20

## 2014-06-02 MED ORDER — THROMBIN 20000 UNITS EX SOLR
CUTANEOUS | Status: AC
  Filled 2014-06-02: qty 20000

## 2014-06-02 MED ORDER — MORPHINE SULFATE 2 MG/ML IJ SOLN: 1.0000 mg | INTRAMUSCULAR | Status: AC | PRN

## 2014-06-02 MED ORDER — BISACODYL 5 MG PO TBEC
10.0000 mg | DELAYED_RELEASE_TABLET | Freq: Every day | ORAL | Status: DC
  Administered 2014-06-03 – 2014-06-04 (×2): 10 mg via ORAL
  Filled 2014-06-02 (×3): qty 2

## 2014-06-02 MED ORDER — SODIUM CHLORIDE 0.9 % IV SOLN
10.0000 g | INTRAVENOUS | Status: DC | PRN
  Administered 2014-06-02 (×2): 5 g/h via INTRAVENOUS

## 2014-06-02 MED ORDER — FENTANYL CITRATE 0.05 MG/ML IJ SOLN
INTRAMUSCULAR | Status: DC | PRN
  Administered 2014-06-02: 150 ug via INTRAVENOUS
  Administered 2014-06-02: 100 ug via INTRAVENOUS
  Administered 2014-06-02: 150 ug via INTRAVENOUS
  Administered 2014-06-02: 250 ug via INTRAVENOUS
  Administered 2014-06-02 (×2): 150 ug via INTRAVENOUS
  Administered 2014-06-02: 100 ug via INTRAVENOUS
  Administered 2014-06-02 (×2): 250 ug via INTRAVENOUS
  Administered 2014-06-02: 100 ug via INTRAVENOUS
  Administered 2014-06-02: 150 ug via INTRAVENOUS
  Administered 2014-06-02: 200 ug via INTRAVENOUS

## 2014-06-02 MED ORDER — FENTANYL CITRATE 0.05 MG/ML IJ SOLN
25.0000 ug | INTRAMUSCULAR | Status: DC | PRN
  Administered 2014-06-03 – 2014-06-04 (×6): 50 ug via INTRAVENOUS
  Filled 2014-06-02 (×4): qty 2

## 2014-06-02 MED ORDER — MILRINONE IN DEXTROSE 20 MG/100ML IV SOLN
INTRAVENOUS | Status: DC | PRN
  Administered 2014-06-02: .3 ug/kg/min via INTRAVENOUS

## 2014-06-02 MED ORDER — ACETAMINOPHEN 650 MG RE SUPP
650.0000 mg | Freq: Once | RECTAL | Status: AC
  Administered 2014-06-02: 650 mg via RECTAL

## 2014-06-02 MED ORDER — GLYCOPYRROLATE 0.2 MG/ML IJ SOLN
INTRAMUSCULAR | Status: AC
  Filled 2014-06-02: qty 1

## 2014-06-02 MED ORDER — DEXMEDETOMIDINE HCL IN NACL 200 MCG/50ML IV SOLN
0.1000 ug/kg/h | INTRAVENOUS | Status: DC
  Administered 2014-06-02: 0.7 ug/kg/h via INTRAVENOUS
  Filled 2014-06-02 (×3): qty 50

## 2014-06-02 MED ORDER — PHENYLEPHRINE HCL 10 MG/ML IJ SOLN
0.0000 ug/min | INTRAVENOUS | Status: DC
  Administered 2014-06-03: 20 ug/min via INTRAVENOUS
  Filled 2014-06-02 (×2): qty 2

## 2014-06-02 MED ORDER — ROCURONIUM BROMIDE 100 MG/10ML IV SOLN
INTRAVENOUS | Status: DC | PRN
  Administered 2014-06-02: 50 mg via INTRAVENOUS

## 2014-06-02 MED ORDER — ALBUMIN HUMAN 5 % IV SOLN
INTRAVENOUS | Status: DC | PRN
  Administered 2014-06-02: 15:00:00 via INTRAVENOUS

## 2014-06-02 MED ORDER — MAGNESIUM SULFATE 4000MG/100ML IJ SOLN
4.0000 g | Freq: Once | INTRAMUSCULAR | Status: AC
  Administered 2014-06-02: 4 g via INTRAVENOUS
  Filled 2014-06-02: qty 100

## 2014-06-02 MED ORDER — POTASSIUM CHLORIDE 10 MEQ/50ML IV SOLN: 10.0000 meq | INTRAVENOUS | Status: AC

## 2014-06-02 MED ORDER — HEPARIN SODIUM (PORCINE) 1000 UNIT/ML IJ SOLN
INTRAMUSCULAR | Status: DC | PRN
  Administered 2014-06-02: 40000 [IU] via INTRAVENOUS

## 2014-06-02 MED ORDER — METOPROLOL TARTRATE 25 MG/10 ML ORAL SUSPENSION
12.5000 mg | Freq: Two times a day (BID) | ORAL | Status: DC
  Filled 2014-06-02 (×7): qty 5

## 2014-06-02 MED ORDER — HEMOSTATIC AGENTS (NO CHARGE) OPTIME
TOPICAL | Status: DC | PRN
  Administered 2014-06-02: 1 via TOPICAL

## 2014-06-02 MED ORDER — METOPROLOL TARTRATE 12.5 MG HALF TABLET
12.5000 mg | ORAL_TABLET | Freq: Two times a day (BID) | ORAL | Status: DC
  Administered 2014-06-03 – 2014-06-05 (×4): 12.5 mg via ORAL
  Filled 2014-06-02 (×7): qty 1

## 2014-06-02 MED ORDER — TRAMADOL HCL 50 MG PO TABS
50.0000 mg | ORAL_TABLET | ORAL | Status: DC | PRN
  Administered 2014-06-03 – 2014-06-04 (×6): 100 mg via ORAL
  Administered 2014-06-05: 50 mg via ORAL
  Administered 2014-06-05: 100 mg via ORAL
  Filled 2014-06-02 (×5): qty 2
  Filled 2014-06-02: qty 1
  Filled 2014-06-02 (×2): qty 2

## 2014-06-02 MED ORDER — SODIUM CHLORIDE 0.9 % IV SOLN: INTRAVENOUS | Status: DC

## 2014-06-02 MED ORDER — ROCURONIUM BROMIDE 50 MG/5ML IV SOLN
INTRAVENOUS | Status: AC
  Filled 2014-06-02: qty 1

## 2014-06-02 MED ORDER — MIDAZOLAM HCL 10 MG/2ML IJ SOLN
INTRAMUSCULAR | Status: AC
  Filled 2014-06-02: qty 2

## 2014-06-02 MED ORDER — FAMOTIDINE IN NACL 20-0.9 MG/50ML-% IV SOLN
20.0000 mg | Freq: Two times a day (BID) | INTRAVENOUS | Status: AC
  Administered 2014-06-02 – 2014-06-03 (×2): 20 mg via INTRAVENOUS
  Filled 2014-06-02: qty 50

## 2014-06-02 MED ORDER — CHLORHEXIDINE GLUCONATE 4 % EX LIQD
30.0000 mL | CUTANEOUS | Status: DC
  Filled 2014-06-02: qty 30

## 2014-06-02 MED ORDER — ARTIFICIAL TEARS OP OINT
TOPICAL_OINTMENT | OPHTHALMIC | Status: DC | PRN
  Administered 2014-06-02: 1 via OPHTHALMIC

## 2014-06-02 MED ORDER — SODIUM CHLORIDE 0.9 % IV SOLN
1.0000 g/h | Freq: Once | INTRAVENOUS | Status: DC
  Filled 2014-06-02: qty 20

## 2014-06-02 MED ORDER — PANTOPRAZOLE SODIUM 40 MG PO TBEC
40.0000 mg | DELAYED_RELEASE_TABLET | Freq: Every day | ORAL | Status: DC
Start: 2014-06-04 — End: 2014-06-05
  Administered 2014-06-04 – 2014-06-05 (×2): 40 mg via ORAL
  Filled 2014-06-02 (×2): qty 1

## 2014-06-02 MED ORDER — SODIUM BICARBONATE 8.4 % IV SOLN
50.0000 meq | Freq: Once | INTRAVENOUS | Status: AC
  Administered 2014-06-02: 50 meq via INTRAVENOUS
  Filled 2014-06-02: qty 50

## 2014-06-02 MED ORDER — PHENYLEPHRINE 40 MCG/ML (10ML) SYRINGE FOR IV PUSH (FOR BLOOD PRESSURE SUPPORT)
PREFILLED_SYRINGE | INTRAVENOUS | Status: AC
  Filled 2014-06-02: qty 10

## 2014-06-02 MED ORDER — ALBUMIN HUMAN 5 % IV SOLN
250.0000 mL | INTRAVENOUS | Status: AC | PRN
  Administered 2014-06-02 – 2014-06-03 (×2): 250 mL via INTRAVENOUS

## 2014-06-02 MED ORDER — ARTIFICIAL TEARS OP OINT
TOPICAL_OINTMENT | OPHTHALMIC | Status: AC
  Filled 2014-06-02: qty 3.5

## 2014-06-02 MED ORDER — SUCCINYLCHOLINE CHLORIDE 20 MG/ML IJ SOLN
INTRAMUSCULAR | Status: DC | PRN
  Administered 2014-06-02: 70 mg via INTRAVENOUS

## 2014-06-02 MED ORDER — SODIUM CHLORIDE 0.9 % IV SOLN
INTRAVENOUS | Status: DC | PRN
  Administered 2014-06-02: 08:00:00 via INTRAVENOUS

## 2014-06-02 MED ORDER — PHENYLEPHRINE HCL 10 MG/ML IJ SOLN
INTRAMUSCULAR | Status: DC | PRN
  Administered 2014-06-02: 20 ug via INTRAVENOUS

## 2014-06-02 MED ORDER — INSULIN REGULAR BOLUS VIA INFUSION
0.0000 [IU] | Freq: Three times a day (TID) | INTRAVENOUS | Status: DC
  Filled 2014-06-02: qty 10

## 2014-06-02 MED ORDER — MILRINONE IN DEXTROSE 20 MG/100ML IV SOLN
0.3000 ug/kg/min | INTRAVENOUS | Status: DC
  Filled 2014-06-02 (×2): qty 100

## 2014-06-02 MED ORDER — PROTAMINE SULFATE 10 MG/ML IV SOLN
INTRAVENOUS | Status: DC | PRN
  Administered 2014-06-02: 350 mg via INTRAVENOUS

## 2014-06-02 MED ORDER — VECURONIUM BROMIDE 10 MG IV SOLR
INTRAVENOUS | Status: DC | PRN
  Administered 2014-06-02 (×7): 10 mg via INTRAVENOUS

## 2014-06-02 MED ORDER — LIDOCAINE HCL (CARDIAC) 20 MG/ML IV SOLN
INTRAVENOUS | Status: DC | PRN
  Administered 2014-06-02: 100 mg via INTRAVENOUS

## 2014-06-02 MED ORDER — DOCUSATE SODIUM 100 MG PO CAPS
200.0000 mg | ORAL_CAPSULE | Freq: Every day | ORAL | Status: DC
  Administered 2014-06-03 – 2014-06-05 (×3): 200 mg via ORAL
  Filled 2014-06-02 (×3): qty 2

## 2014-06-02 MED ORDER — LACTATED RINGERS IV SOLN: INTRAVENOUS | Status: DC

## 2014-06-02 MED ORDER — SODIUM CHLORIDE 0.9 % IV SOLN
INTRAVENOUS | Status: DC
  Administered 2014-06-02: 10.2 [IU]/h via INTRAVENOUS
  Filled 2014-06-02: qty 1

## 2014-06-02 MED ORDER — ASPIRIN EC 325 MG PO TBEC
325.0000 mg | DELAYED_RELEASE_TABLET | Freq: Every day | ORAL | Status: DC
  Administered 2014-06-03 – 2014-06-04 (×2): 325 mg via ORAL
  Filled 2014-06-02 (×2): qty 1

## 2014-06-02 MED ORDER — VECURONIUM BROMIDE 10 MG IV SOLR
INTRAVENOUS | Status: AC
  Filled 2014-06-02: qty 20

## 2014-06-02 MED ORDER — NITROGLYCERIN IN D5W 200-5 MCG/ML-% IV SOLN: 0.0000 ug/min | INTRAVENOUS | Status: DC

## 2014-06-02 MED ORDER — LIDOCAINE HCL (CARDIAC) 20 MG/ML IV SOLN
INTRAVENOUS | Status: AC
  Filled 2014-06-02: qty 10

## 2014-06-02 MED ORDER — SODIUM CHLORIDE 0.45 % IV SOLN
INTRAVENOUS | Status: DC
  Administered 2014-06-02: 18:00:00 via INTRAVENOUS

## 2014-06-02 MED ORDER — SODIUM CHLORIDE 0.9 % IV SOLN
INTRAVENOUS | Status: DC | PRN
  Administered 2014-06-02: 10:00:00 via INTRAVENOUS

## 2014-06-02 MED ORDER — THROMBIN 20000 UNITS EX SOLR
CUTANEOUS | Status: DC | PRN
  Administered 2014-06-02: 20000 [IU] via TOPICAL

## 2014-06-02 MED ORDER — SODIUM CHLORIDE 0.9 % IJ SOLN
3.0000 mL | INTRAMUSCULAR | Status: DC | PRN
  Administered 2014-06-03 – 2014-06-05 (×2): 3 mL via INTRAVENOUS

## 2014-06-02 MED ORDER — DEXTROSE 5 % IV SOLN
1.5000 g | Freq: Two times a day (BID) | INTRAVENOUS | Status: AC
  Administered 2014-06-02 – 2014-06-04 (×4): 1.5 g via INTRAVENOUS
  Filled 2014-06-02 (×4): qty 1.5

## 2014-06-02 MED ORDER — SODIUM CHLORIDE 0.9 % IJ SOLN
INTRAMUSCULAR | Status: AC
  Filled 2014-06-02: qty 10

## 2014-06-02 MED ORDER — METHYLPREDNISOLONE SODIUM SUCC 125 MG IJ SOLR
INTRAMUSCULAR | Status: DC | PRN
  Administered 2014-06-02: 125 mg via INTRAVENOUS

## 2014-06-02 MED ORDER — EPHEDRINE SULFATE 50 MG/ML IJ SOLN
INTRAMUSCULAR | Status: DC | PRN
  Administered 2014-06-02 (×2): 5 mg via INTRAVENOUS

## 2014-06-02 MED ORDER — SODIUM CHLORIDE 0.9 % IJ SOLN
3.0000 mL | Freq: Two times a day (BID) | INTRAMUSCULAR | Status: DC
  Administered 2014-06-03: 3 mL via INTRAVENOUS
  Administered 2014-06-03 – 2014-06-04 (×2): 10 mL via INTRAVENOUS
  Administered 2014-06-04 – 2014-06-05 (×2): 3 mL via INTRAVENOUS

## 2014-06-02 MED ORDER — THROMBIN 20000 UNITS EX SOLR
OROMUCOSAL | Status: DC | PRN
  Administered 2014-06-02 (×3): via TOPICAL

## 2014-06-02 MED ORDER — METOPROLOL TARTRATE 1 MG/ML IV SOLN: 2.5000 mg | INTRAVENOUS | Status: DC | PRN

## 2014-06-02 MED ORDER — LIDOCAINE HCL (CARDIAC) 20 MG/ML IV SOLN
INTRAVENOUS | Status: AC
  Filled 2014-06-02: qty 5

## 2014-06-02 MED ORDER — PROPOFOL 10 MG/ML IV BOLUS
INTRAVENOUS | Status: DC | PRN
  Administered 2014-06-02: 40 mg via INTRAVENOUS
  Administered 2014-06-02: 400 mg via INTRAVENOUS
  Administered 2014-06-02: 60 mg via INTRAVENOUS

## 2014-06-02 MED ORDER — MIDAZOLAM HCL 2 MG/2ML IJ SOLN: 2.0000 mg | INTRAMUSCULAR | Status: DC | PRN

## 2014-06-02 SURGICAL SUPPLY — 108 items
ADAPTER CARDIO PERF ANTE/RETRO (ADAPTER) ×4 IMPLANT
ADAPTER DLP PERFUSION .25INX2I (MISCELLANEOUS) ×4 IMPLANT
APPLICATOR TIP COSEAL (VASCULAR PRODUCTS) ×16 IMPLANT
ATTRACTOMAT 16X20 MAGNETIC DRP (DRAPES) ×4 IMPLANT
BAG DECANTER FOR FLEXI CONT (MISCELLANEOUS) ×4 IMPLANT
BLADE STERNUM SYSTEM 6 (BLADE) ×4 IMPLANT
BLADE SURG 15 STRL LF DISP TIS (BLADE) ×2 IMPLANT
BLADE SURG 15 STRL SS (BLADE) ×2
CANISTER SUCTION 2500CC (MISCELLANEOUS) ×8 IMPLANT
CANNULA GUNDRY RCSP 15FR (MISCELLANEOUS) ×4 IMPLANT
CANNULA SUMP PERICARDIAL (CANNULA) ×4 IMPLANT
CATH ROBINSON RED A/P 18FR (CATHETERS) ×12 IMPLANT
CATH THORACIC 36FR (CATHETERS) ×4 IMPLANT
CATH THORACIC 36FR RT ANG (CATHETERS) ×4 IMPLANT
CAUTERY HIGH TEMP VAS (MISCELLANEOUS) ×4 IMPLANT
CLIP TI MEDIUM 24 (CLIP) ×4 IMPLANT
CONN 1/2X1/2X1/2  BEN (MISCELLANEOUS) ×2
CONN 1/2X1/2X1/2 BEN (MISCELLANEOUS) ×2 IMPLANT
CONN 1/2X3/8X3/8 Y GISH (MISCELLANEOUS) ×4 IMPLANT
CONN ST 1/4X3/8  BEN (MISCELLANEOUS) ×2
CONN ST 1/4X3/8 BEN (MISCELLANEOUS) ×2 IMPLANT
CONT SPEC 4OZ CLIKSEAL STRL BL (MISCELLANEOUS) ×8 IMPLANT
CONT SPEC STER OR (MISCELLANEOUS) ×4 IMPLANT
COVER SURGICAL LIGHT HANDLE (MISCELLANEOUS) ×8 IMPLANT
CRADLE DONUT ADULT HEAD (MISCELLANEOUS) ×4 IMPLANT
DERMABOND ADVANCED (GAUZE/BANDAGES/DRESSINGS) ×2
DERMABOND ADVANCED .7 DNX12 (GAUZE/BANDAGES/DRESSINGS) ×2 IMPLANT
DRAPE CARDIOVASCULAR INCISE (DRAPES) ×4
DRAPE SLUSH/WARMER DISC (DRAPES) ×4 IMPLANT
DRAPE SRG 135X102X78XABS (DRAPES) ×4 IMPLANT
DRSG AQUACEL AG ADV 3.5X10 (GAUZE/BANDAGES/DRESSINGS) ×4 IMPLANT
DRSG COVADERM 4X14 (GAUZE/BANDAGES/DRESSINGS) ×4 IMPLANT
ELECT CAUTERY BLADE 6.4 (BLADE) ×4 IMPLANT
ELECT REM PT RETURN 9FT ADLT (ELECTROSURGICAL) ×8
ELECTRODE REM PT RTRN 9FT ADLT (ELECTROSURGICAL) ×4 IMPLANT
GLOVE BIO SURGEON STRL SZ 6 (GLOVE) ×28 IMPLANT
GLOVE BIO SURGEON STRL SZ 6.5 (GLOVE) ×24 IMPLANT
GLOVE BIO SURGEON STRL SZ7 (GLOVE) IMPLANT
GLOVE BIO SURGEON STRL SZ7.5 (GLOVE) IMPLANT
GLOVE BIO SURGEON STRL SZ8 (GLOVE) ×16 IMPLANT
GLOVE BIO SURGEONS STRL SZ 6.5 (GLOVE) ×8
GLOVE BIOGEL PI IND STRL 6 (GLOVE) ×8 IMPLANT
GLOVE BIOGEL PI IND STRL 6.5 (GLOVE) ×8 IMPLANT
GLOVE BIOGEL PI IND STRL 7.0 (GLOVE) ×8 IMPLANT
GLOVE BIOGEL PI INDICATOR 6 (GLOVE) ×8
GLOVE BIOGEL PI INDICATOR 6.5 (GLOVE) ×8
GLOVE BIOGEL PI INDICATOR 7.0 (GLOVE) ×8
GLOVE EUDERMIC 7 POWDERFREE (GLOVE) ×12 IMPLANT
GOWN STRL REUS W/ TWL LRG LVL3 (GOWN DISPOSABLE) ×32 IMPLANT
GOWN STRL REUS W/ TWL XL LVL3 (GOWN DISPOSABLE) ×2 IMPLANT
GOWN STRL REUS W/TWL LRG LVL3 (GOWN DISPOSABLE) ×32
GOWN STRL REUS W/TWL XL LVL3 (GOWN DISPOSABLE) ×2
GRAFT HEMASHIELD 30X10 (Vascular Products) ×4 IMPLANT
GRAFT VASC 8MMX60CM STRAIGHT (Prosthesis & Implant Heart) ×4 IMPLANT
HEART VENT LT CURVED (MISCELLANEOUS) ×4 IMPLANT
HEMOSTAT POWDER SURGIFOAM 1G (HEMOSTASIS) ×12 IMPLANT
HEMOSTAT SURGICEL 2X14 (HEMOSTASIS) ×4 IMPLANT
INSERT FOGARTY SM (MISCELLANEOUS) ×4 IMPLANT
INSERT FOGARTY XLG (MISCELLANEOUS) ×4 IMPLANT
IV ADAPTER SYR DOUBLE MALE LL (MISCELLANEOUS) ×4 IMPLANT
KIT BASIN OR (CUSTOM PROCEDURE TRAY) ×4 IMPLANT
KIT CATH CPB BARTLE (MISCELLANEOUS) ×4 IMPLANT
KIT ROOM TURNOVER OR (KITS) ×4 IMPLANT
KIT SUCTION CATH 14FR (SUCTIONS) ×4 IMPLANT
LINE VENT (MISCELLANEOUS) ×4 IMPLANT
NS IRRIG 1000ML POUR BTL (IV SOLUTION) ×28 IMPLANT
PACK OPEN HEART (CUSTOM PROCEDURE TRAY) ×4 IMPLANT
PAD ARMBOARD 7.5X6 YLW CONV (MISCELLANEOUS) ×8 IMPLANT
SEALANT SURG COSEAL 8ML (VASCULAR PRODUCTS) ×4 IMPLANT
SET CARDIOPLEGIA MPS 5001102 (MISCELLANEOUS) ×4 IMPLANT
SPONGE GAUZE 4X4 12PLY (GAUZE/BANDAGES/DRESSINGS) ×8 IMPLANT
STOPCOCK 4 WAY LG BORE MALE ST (IV SETS) ×4 IMPLANT
STOPCOCK MORSE 400PSI 3WAY (MISCELLANEOUS) ×8 IMPLANT
SUT BONE WAX W31G (SUTURE) ×4 IMPLANT
SUT ETHIBON 2 0 V 52N 30 (SUTURE) ×8 IMPLANT
SUT ETHIBON EXCEL 2-0 V-5 (SUTURE) IMPLANT
SUT ETHIBOND V-5 VALVE (SUTURE) IMPLANT
SUT PROLENE 3 0 SH 1 (SUTURE) ×4 IMPLANT
SUT PROLENE 3 0 SH 48 (SUTURE) ×4 IMPLANT
SUT PROLENE 3 0 SH DA (SUTURE) ×16 IMPLANT
SUT PROLENE 4 0 RB 1 (SUTURE) ×8
SUT PROLENE 4-0 RB1 .5 CRCL 36 (SUTURE) ×8 IMPLANT
SUT PROLENE 5 0 C 1 36 (SUTURE) ×12 IMPLANT
SUT PROLENE 5 0 RB 2 (SUTURE) ×8 IMPLANT
SUT SILK 2 0 SH CR/8 (SUTURE) ×4 IMPLANT
SUT STEEL 6MS V (SUTURE) IMPLANT
SUT STEEL STERNAL CCS#1 18IN (SUTURE) IMPLANT
SUT STEEL SZ 6 DBL 3X14 BALL (SUTURE) ×12 IMPLANT
SUT VIC AB 1 CTX 36 (SUTURE) ×4
SUT VIC AB 1 CTX36XBRD ANBCTR (SUTURE) ×4 IMPLANT
SUT VIC AB 2-0 CT1 27 (SUTURE)
SUT VIC AB 2-0 CT1 36 (SUTURE) ×8 IMPLANT
SUT VIC AB 2-0 CT1 TAPERPNT 27 (SUTURE) IMPLANT
SUT VIC AB 3-0 X1 27 (SUTURE) ×4 IMPLANT
SUTURE E-PAK OPEN HEART (SUTURE) ×4 IMPLANT
SYSTEM SAHARA CHEST DRAIN ATS (WOUND CARE) ×4 IMPLANT
TAPE CLOTH SURG 4X10 WHT LF (GAUZE/BANDAGES/DRESSINGS) ×8 IMPLANT
TOWEL OR 17X24 6PK STRL BLUE (TOWEL DISPOSABLE) ×4 IMPLANT
TOWEL OR 17X26 10 PK STRL BLUE (TOWEL DISPOSABLE) ×4 IMPLANT
TRAY CATH LUMEN 1 20CM STRL (SET/KITS/TRAYS/PACK) ×4 IMPLANT
TRAY FOLEY IC TEMP SENS 14FR (CATHETERS) ×4 IMPLANT
TUBING BULK SUCTION (MISCELLANEOUS) ×4 IMPLANT
TUBING MEDICAL 3X8X3X32 (MISCELLANEOUS) ×4 IMPLANT
UNDERPAD 30X30 INCONTINENT (UNDERPADS AND DIAPERS) ×4 IMPLANT
VALVE AOR CV12X25X28X20.4X (Prosthesis & Implant Heart) ×2 IMPLANT
VALVE AORTIC COND (Prosthesis & Implant Heart) ×2 IMPLANT
WATER STERILE IRR 1000ML POUR (IV SOLUTION) ×8 IMPLANT
YANKAUER SUCT BULB TIP NO VENT (SUCTIONS) ×4 IMPLANT

## 2014-06-02 NOTE — Anesthesia Preprocedure Evaluation (Addendum)
Anesthesia Evaluation Anesthesia Physical Anesthesia Plan  ASA: IV  Anesthesia Plan:    Post-op Pain Management:    Induction:   Airway Management Planned:   Additional Equipment:   Intra-op Plan:   Post-operative Plan:   Informed Consent:   Plan Discussed with:   Anesthesia Plan Comments:         Anesthesia Quick Evaluation

## 2014-06-02 NOTE — Interval H&P Note (Signed)
History and Physical Interval Note:  06/02/2014 7:22 AM  Alec Kelly  has presented today for surgery, with the diagnosis of AS, TAA  The various methods of treatment have been discussed with the patient and family. After consideration of risks, benefits and other options for treatment, the patient has consented to  Procedure(s): BENTALL PROCEDURE (N/A)   AORTIC Arch replacement (N/A) INTRAOPERATIVE TRANSESOPHAGEAL ECHOCARDIOGRAM (N/A) as a surgical intervention .  The patient's history has been reviewed, patient examined, no change in status, stable for surgery.  I have reviewed the patient's chart and labs.  Questions were answered to the patient's satisfaction.     Gaye Pollack

## 2014-06-02 NOTE — Op Note (Signed)
CARDIOVASCULAR SURGERY OPERATIVE NOTE  06/02/2014  Surgeon:  Gaye Pollack, MD  First Assistant: Jadene Pierini,  PA-C   Preoperative Diagnosis:  Bicuspid aortic valve stenosis and ascending aortic aneurysm.   Postoperative Diagnosis:  Same   Procedure:  1. Median Sternotomy 2. Right axillary artery cannulation using an 8 mm hemashield graft 3.   Extracorporeal circulation 4.   Replacement of ascending aorta and proximal arch (Hemi-arch) under deep hypothermic circulatory arrest 5.   Bentall procedure using a 25 mm St. Jude valved graft. 6.   Insertion of right femoral arterial line.   Anesthesia:  General Endotracheal   Clinical History/Surgical Indication:  The patient is a 48 year old gentleman with a strong family history of heart disease, a history of obesity and diverticulitis of the sigmoid colon who was admitted at the end of March with an episode and had an abdominal CT showing uncomplicated diverticulitis. It also showed extensive calcification of the aortic valve. He was having some symptoms of shortness of breath with exertion, nonexertional chest pain and chronic lower extremity edema and was referred to cardiology. A 2D echo on 5/26 showed a moderately thickened, severely calcified aortic valve that could be bicuspid with a mean gradient of 32 mm Hg and a peak of 52 mm Hg with an AVA of 1 cm2. There was no AI. There was severe LV dysfunction with an EF of 25-30%. Cardiac cath showed moderate pulmonary hypertension with PA pressure of 41/31, PCWP of 27/25 with a mean gradient of 27 and peak of 58. CI was 2.57. There was no significant coronary disease. The LV was dilated with global hypokinesis, no MR, and dilation of the aortic root and ascending aorta. I discussed the operative procedure of Bentall procedure and replacement of the ascending aorta and aortic arch with the patient and family including alternatives, benefits and risks; including but not limited to bleeding,  blood transfusion, infection, stroke, myocardial infarction, graft failure, heart block requiring a permanent pacemaker, organ dysfunction, and death. I reviewed the recommendation for a mechanical valve given his age. Alec Kelly understands and agrees to proceed.     Preparation:  The patient was seen in the preoperative holding area and the correct patient, correct operation were confirmed with the patient after reviewing the medical record and catheterization. The consent was signed by me. Preoperative antibiotics were given. A pulmonary arterial line and radial arterial line were placed by the anesthesia team. The patient was taken back to the operating room and positioned supine on the operating room table. After being placed under general endotracheal anesthesia by the anesthesia team a foley catheter was placed. The neck, chest, abdomen, and both legs were prepped with betadine soap and solution and draped in the usual sterile manner. A surgical time-out was taken and the correct patient and operative procedure were confirmed with the nursing and anesthesia staff.  TEE:  Performed by Dr. Laurie Panda. This showed a calcified bicuspid aortic valve with moderate stenosis. There was trivial AI, no MR. There was moderate concentric LVH with moderate LV dysfunction but not dilatation. EF was about 30%. RV normal.  Insertion of right femoral arterial line:  The right femoral artery was cannulated with a needle and a guidewire advanced through the needle. The tract was dilated and the arterial catheter inserted over the guidewire. The wire was removed and the catheter connected to the transducer. There was a good arterial tracing. The catheter was anchored to the skin with sutures.  Right axillary artery exposure and cannulation:  A transverse incision was made below the right clavicle. The pectoralis major muscle was split along its fibers and the pectoralis minor muscle was retracted  laterally. The brachial plexus was identified and gently retracted laterally to expose the axillary artery. The artery was controlled proximally and distally with vessel loops. The patient was fully heparinized and ACT maintained greater than 400. The axillary artery was clamped proximally and distally with peripheral Debakey clamps. It was opened longitudinally. There was no sign of dissection here. An 8 mm Hemashield dacron graft was anastomosed in an end to side manner using continuous 5-0 prolene suture. CoSeal was applied for hemostasis and the clamp removed. The graft was connected to the arterial end of the bypass circuit.   Median sternotomy:  A median sternotomy was performed. The pericardium was opened in the midline. Right ventricular function appeared normal. The ascending aorta was of enlarged and had no palpable plaque. It appeared to decrease to near normal caliber just proximal to the innominate artery. I did not feel it was necessary to replace the aorta beyond the innominate artery since it only measured 30 mm at the takeoff of the innominate artery. Venous cannulation was performed via the right atrial appendage using a two-staged venous cannula.  A temperature probe was inserted into the interventricular septum and an insulating pad was placed in the pericardium. CO2 was insufflated into the pericardium throughout the case to minimize intracardiac air.   Resection and grafting of ascending aortic aneurysm:  The patient was placed on cardiopulmonary bypass and a left ventricular vent was placed via the right superior pulmonary vein. Systemic cooling was begun with a goal temperature of 18 degrees centigrade by bladder and rectal temperature probes. A retrograde cardioplegia cannula was placed through the right atrium into the coronary sinus without difficulty. A retrograde cerebraplegia cannula was placed into the SVC through a pursestring suture and the SVC was encircled with a silastic  tape. After 30 minutes of cooling the target temperature of 18 degrees centigrade was reached. Cerebral oximetry was 70% bilaterally. BIS was zero. The patient was given Diprivan  and 125 mg of Solumedrol by anesthesia. The head was packed in ice. The bed was placed in steep trendelenburg. Circulatory arrest was begun and the blood volume emptied into the venous reservoir. The innominate artery was clamped.  Continuous antegrade cerebral perfusion was begun. Cold blood retrograde cardioplegia was given and myocardial temperature dropped to 10 degrees centigrade. Additional doses were given at approximately 20 minute intervals throughout the period of circulatory arrest and cross-clamping. Complete diastolic arrest was maintained. The aorta was transected just proximal to the innominate artery beveling the resection out along the undersurface of the aortic arch (Hemiarch replacement). The aortic diameter was measured at 30 mm here. A 30 mm Hemasheild Platinum vascular graft was prepared. It was anastomosed to the aortic arch in an end to end manner using 3-0 prolene continuous suture with a felt strip to reinforce the anastomisis. A light coating of CoSeal was applied to seal needle holes. Circulation was slowly resumed. The aortic graft was cross-clamped proximal and full CPB support was resumed. Circulatory arrest time was 27 minutes. Antegrade cerebral perfusion time was 25 minutes.   Bentall Procedure:   The ascending aorta was mobilized from the right pulmonary artery and main PA. It was opened longitudinally and the valve inspected. It was a heavily calcified bicuspid aortic valve with fusion of the right and noncoronary cusps. It  appeared severely stenotic. The aortic sinuses were dilated. The right and left coronary arteries were removed from the aortic root with a button of aortic wall around the ostia. They were retracted carefully out of the way with stay sutures to prevent rotation. The native valve  was excised taking care to remove all particulate debri. The annulus was decalcified with rongeurs. The annulus was sized and a 25 mm St. Jude Mechanical Valved Graft was chosen. ( Ref # [id number], Serial # [id number]) A series of pledgetted 2-0 Ethibond horizontal mattress sutures were placed around the annulus with the pledgets in a sub-annular position. The sutures were placed through a strip of autologous pericardium to reinforce the annulus and then the valve sewing ring. The valve was lowered into place and the sutures at the hinge posts tied first followed by the remaining sutures. The valve seated nicely. The discs moved normally. Small openings were made in the graft for the coronary anastomoses using a thermal cautery. Then the left and right coronary buttons were anastomosed to the graft in an end to side manner using continuous 5-0 prolene suture. A light coating of CoSeal was applied to each anastomosis for hemostasis. The two grafts were then cut to the appropriate length and anastomosed end to end using continuous 3-0 prolene suture. CoSeal was applied to seal the needle holes in the grafts. A vent cannula was placed into the graft to remove any air. Deairing maneuvers were performed and the bed placed in trendelenburg position.   Completion:  The patient was rewarmed to 37 degrees Centigrade.  The crossclamp was removed with a time of 183 minutes. The vascular anastomoses all appeared hemostatic. Two temporary epicardial pacing wires were placed on the right atrium and two on the right ventricle. The patient was weaned from CPB without difficulty on dopamine 5 and milrinone 0.3. CPB time was 289 minutes. Cardiac output was 7 LPM. Heparin was fully reversed with protamine and the aortic and venous cannulas removed. Hemostasis was achieved. Mediastinal drainage tubes were placed. The sternum was closed with double #6 stainless steel wires. The fascia was closed with continuous # 1 vicryl suture.  The subcutaneous tissue was closed with 2-0 vicryl continuous suture. The skin was closed with 3-0 vicryl subcuticular suture. The right axillary artery graft was ligated close to the anastomosis with a heavy silk tie and suture ligated with 3-0 prolene suture. The incision was closed in layers.  All sponge, needle, and instrument counts were reported correct at the end of the case. Dry sterile dressings were placed over the incisions and around the chest tubes which were connected to pleurevac suction. The patient was then transported to the surgical intensive care unit in critical but stable condition.

## 2014-06-02 NOTE — Anesthesia Procedure Notes (Addendum)
Anesthesia Procedure Note PA catheter:  Routine monitors. Timeout, sterile prep, drape, FBP R neck.  Trendelenburg position.  1% Lido local, finder and trocar RIJ 1st pass with US guidance.  Cordis placed over J wire. PA catheter in easily.  Sterile dressing applied.  Patient tolerated well, VSS.  Jenita Seashore, MD  06:50-07:09 Procedure Name: Intubation Date/Time: 06/02/2014 8:16 AM Performed by: Jacquiline Doe A Pre-anesthesia Checklist: Patient identified, Timeout performed, Emergency Drugs available, Suction available and Patient being monitored Patient Re-evaluated:Patient Re-evaluated prior to inductionOxygen Delivery Method: Circle system utilized Preoxygenation: Pre-oxygenation with 100% oxygen Intubation Type: IV induction and Cricoid Pressure applied Ventilation: Mask ventilation with difficulty and Oral airway inserted - appropriate to patient size Laryngoscope Size: Mac Grade View: Grade I Tube type: Subglottic suction tube Tube size: 8.0 mm Number of attempts: 1 Airway Equipment and Method: Stylet,  Video-laryngoscopy and Oral airway Placement Confirmation: ETT inserted through vocal cords under direct vision,  breath sounds checked- equal and bilateral and positive ETCO2 Secured at: 22 cm Tube secured with: Tape Dental Injury: Teeth and Oropharynx as per pre-operative assessment  Difficulty Due To: Difficulty was anticipated, Difficult Airway- due to large tongue, Difficult Airway- due to reduced neck mobility, Difficult Airway- due to anterior larynx and Difficult Airway- due to limited oral opening   Anesthesia Procedure Note PA catheter:  Routine monitors. Timeout, sterile prep, drape, FBP R neck.  Trendelenburg position.  1% Lido local, finder and trocar RIJ 1st pass with US guidance.  Cordis placed over J wire. PA catheter in easily.  Sterile dressing applied.  Patient tolerated well, VSS.  Jenita Seashore, MD  06:50-07:09 Anesthesia Procedure Note PA catheter:  Routine monitors.  Timeout, sterile prep, drape, FBP R neck.  Trendelenburg position.  1% Lido local, finder and trocar RIJ 1st pass with US guidance.  Cordis placed over J wire. PA catheter in easily.  Sterile dressing applied.  Patient tolerated well, VSS.  Jenita Seashore, MD  06:50-07:09 Anesthesia Procedure Note PA catheter:  Routine monitors. Timeout, sterile prep, drape, FBP R neck.  Trendelenburg position.  1% Lido local, finder and trocar RIJ 1st pass with US guidance.  Cordis placed over J wire. PA catheter in easily.  Sterile dressing applied.  Patient tolerated well, VSS.  Jenita Seashore, MD  06:50-07:09 Anesthesia Procedure Note PA catheter:  Routine monitors. Timeout, sterile prep, drape, FBP R neck.  Trendelenburg position.  1% Lido local, finder and trocar RIJ 1st pass with US guidance.  Cordis placed over J wire. PA catheter in easily.  Sterile dressing applied.  Patient tolerated well, VSS.  Jenita Seashore, MD  06:50-07:09

## 2014-06-02 NOTE — Transfer of Care (Signed)
Immediate Anesthesia Transfer of Care Note  Patient: Alec Kelly  Procedure(s) Performed: Procedure(s): BENTALL PROCEDURE: 23mm St Jude Valve Conduit  (N/A) Aortic Arch Replacement with 29mm Hemasheild Graft (N/A) INTRAOPERATIVE TRANSESOPHAGEAL ECHOCARDIOGRAM (N/A)  Patient Location: SICU  Anesthesia Type:General  Level of Consciousness: Patient remains intubated per anesthesia plan  Airway & Oxygen Therapy: Patient remains intubated per anesthesia plan and Patient placed on Ventilator (see vital sign flow sheet for setting)  Post-op Assessment: REPORT GIVEN TO RN TAKING CARE OP PATIENT IN SICU ,  Post vital signs: Reviewed and stable  Complications: No apparent anesthesia complications

## 2014-06-02 NOTE — H&P (Signed)
Cardiothoracic Surgery History and Physical   PCP is No primary provider on file.  Referring Provider is Sueanne Margarita, MD  Chief Complaint   Patient presents with   .  Shortness of Breath     on exertion...aortic stenosis...ECHO 04/29/14.Marland KitchenMarland KitchenCATHED .Marland KitchenMarland Kitchen6/1/15...aortic root aneurysm    HPI:   The patient is a 48 year old gentleman with a strong family history of heart disease, a history of obesity and diverticulitis of the sigmoid colon who was admitted at the end of March with an episode and had an abdominal CT showing uncomplicated diverticulitis. It also showed extensive calcification of the aortic valve. He was having some symptoms of shortness of breath with exertion, nonexertional chest pain and chronic lower extremity edema and was referred to cardiology. A 2D echo on 5/26 showed a moderately thickened, severely calcified aortic valve that could be bicuspid with a mean gradient of 32 mm Hg and a peak of 52 mm Hg with an AVA of 1 cm2. There was no AI. There was severe LV dysfunction with an EF of 25-30%. Cardiac cath showed moderate pulmonary hypertension with PA pressure of 41/31, PCWP of 27/25 with a mean gradient of 27 and peak of 58. CI was 2.57. There was no significant coronary disease. The LV was dilated with global hypokinesis, no MR, and dilation of the aortic root and ascending aorta.   Past Medical History   Diagnosis  Date   .  Kidney stones    .  Diverticulitis    .  Diverticulitis of sigmoid colon  04/07/2013     With perforation   .  Obesity, Class III, BMI 40-49.9 (morbid obesity)  04/07/2013   .  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  Tonsillectomy     .  Appendectomy       53-10 years old - Dr. Lennie Hummer   .  Hernia repair   05/16/7034     umbilical - At [facility], Dr. Lennie Hummer, Repair of umbilical hernia with mesh.   .  Lithotripsy      Family History   Problem  Relation  Age of Onset   .  Heart failure   Mother    .  Diabetes  Mother    .  Hypertension  Mother    .  Hyperlipidemia  Mother    .  COPD  Mother    .  Cancer  Father      lung & prostate   .  Hyperlipidemia  Father    .  Heart disease  Father    .  Coronary artery disease  Father    Social History  History   Substance Use Topics   .  Smoking status:  Never Smoker   .  Smokeless tobacco:  Never Used   .  Alcohol Use:  Yes      Comment: social    Current Outpatient Prescriptions   Medication  Sig  Dispense  Refill   .  allopurinol (ZYLOPRIM) 100 MG tablet  Take 100 mg by mouth daily as needed.      No current facility-administered medications for this visit.    Allergies   Allergen  Reactions   .  Ivp Dye [Iodinated Diagnostic Agents]  Nausea And Vomiting    Review of Systems   Constitutional: Positive for fatigue and unexpected weight change.  HENT: Negative. Negative  for dental problem.  Eyes:  Floaters  Respiratory: Positive for cough and shortness of breath.  Cardiovascular: Positive for chest pain and leg swelling.  Gastrointestinal: Positive for blood in stool.  Endocrine: Negative.  Genitourinary:  Kidney stones  Allergic/Immunologic: Negative.  Neurological: Negative for dizziness, syncope and headaches.  Numbness in hands and feet  Hematological: Negative.  Psychiatric/Behavioral: Negative.   BP 111/77  Pulse 102  Resp 16  Ht 5\' 8"  (1.727 m)  Wt 299 lb (135.626 kg)  BMI 45.47 kg/m2  SpO2 97%   Physical Exam   Constitutional: He is oriented to person, place, and time. He appears well-developed and well-nourished.  Obese white male in no distress  HENT:  Head: Normocephalic and atraumatic.  Mouth/Throat: Oropharynx is clear and moist.  Eyes: EOM are normal. Pupils are equal, round, and reactive to light.  Neck: Neck supple. No JVD present. No thyromegaly present.  Cardiovascular: Normal rate and regular rhythm.  Murmur heard. High pitched systolic murmur loudest along RSB but heard  throughout the precordium and into both sides of the neck.  Pulmonary/Chest: Effort normal and breath sounds normal. No respiratory distress. He has no rales.  Abdominal: Soft. Bowel sounds are normal. He exhibits no distension and no mass. There is no tenderness.  Musculoskeletal: Normal range of motion. He exhibits edema. He exhibits no tenderness.  Lymphadenopathy:  He has no cervical adenopathy.  Neurological: He is alert and oriented to person, place, and time. He has normal strength. No cranial nerve deficit or sensory deficit.  Skin: Skin is warm and dry.  Psychiatric: He has a normal mood and affect.   Diagnostic Tests:   *[facility] [place], [place], [place] [address] [phone]  ------------------------------------------------------------------- Transthoracic Echocardiography  Patient: Alec Kelly, Alec Kelly MR #: [id number] Study Date: 05/09/2014 Gender: M Age: 65 Height: 172.7 cm Weight: 126.6 kg BSA: 2.53 m^2 Pt. Status: Room:  ATTENDING Fransico Him, MD ORDERING Fransico Him, MD REFERRING Fransico Him, MD SONOGRAPHER Marygrace Drought, RCS PERFORMING [facility], Outpatient  cc:  ------------------------------------------------------------------- LV EF: 25% - 30%  ------------------------------------------------------------------- Indications: 424.1 Aortic valve disorders.  ------------------------------------------------------------------- History: PMH: LBBB, SOB.  ------------------------------------------------------------------- Study Conclusions  - Left ventricle: The cavity size was normal. Wall thickness was increased in a pattern of moderate LVH. Systolic function was severely reduced. The estimated ejection fraction was in the range of 25% to 30%. Diffuse hypokinesis with asynchronous contraction secondary to bundle branch block. Features are consistent with a pseudonormal left ventricular filling pattern, with  concomitant abnormal relaxation and increased filling pressure (grade 2 diastolic dysfunction). - Aortic valve: A bicuspid morphology cannot be excluded; moderately thickened, severely calcified leaflets. Cusp separation was reduced. There was moderate stenosis. (Aortic stenosis may be underestimated as a result of low EF). Peak velocity (S): 360 cm/s. Mean gradient (S): 32 mm Hg. - Mitral valve: Calcified annulus.  -------------------------------------------------------------------  ------------------------------------------------------------------- Left ventricle: The cavity size was normal. Wall thickness was increased in a pattern of moderate LVH. Systolic function was severely reduced. The estimated ejection fraction was in the range of 25% to 30%. Diffuse hypokinesis with asynchronous contraction secondary to bundle branch block. Features are consistent with a pseudonormal left ventricular filling pattern, with concomitant abnormal relaxation and increased filling pressure (grade 2 diastolic dysfunction).  ------------------------------------------------------------------- Aortic valve: A bicuspid morphology cannot be excluded; moderately thickened, severely calcified leaflets. Cusp separation was reduced. Doppler: There was moderate stenosis. (Aortic stenosis may be underestimated as a result of low EF). There was  no regurgitation. Valve area (VTI): 1 cm^2. Indexed valve area (VTI): 0.4 cm^2/m^2. Valve area (Vmax): 1 cm^2. Indexed valve area (Vmax): 0.4 cm^2/m^2. Mean gradient (S): 32 mm Hg. Peak gradient (S): 52 mm Hg.  ------------------------------------------------------------------- Mitral valve: Calcified annulus. Leaflet separation was normal. Doppler: Transvalvular velocity was within the normal range. There was no evidence for stenosis. There was trivial regurgitation. Peak gradient (D): 4 mm  Hg.  ------------------------------------------------------------------- Left atrium: LA volume/ BSA = 22.6 ml/m2.  ------------------------------------------------------------------- Right ventricle: The cavity size was normal. Wall thickness was normal. Systolic function was normal.  ------------------------------------------------------------------- Pulmonic valve: Structurally normal valve. Cusp separation was normal. Doppler: Transvalvular velocity was within the normal range. There was no regurgitation.  ------------------------------------------------------------------- Tricuspid valve: Structurally normal valve. Leaflet separation was normal. Doppler: Transvalvular velocity was within the normal range. There was trivial regurgitation.  ------------------------------------------------------------------- Pulmonary artery: The main pulmonary artery was normal-sized.  ------------------------------------------------------------------- Right atrium: The atrium was normal in size.  ------------------------------------------------------------------- Pericardium: The pericardium was normal in appearance. There was no pericardial effusion.  ------------------------------------------------------------------- Systemic veins: Inferior vena cava: The vessel was normal in size. The respirophasic diameter changes were in the normal range (= 50%), consistent with normal central venous pressure. Diameter: 17 mm.  ------------------------------------------------------------------- Prepared and Electronically Authenticated by  Candee Furbish, M.D. 2015-05-26T16:48:52  Cardiac Catheterization Procedure Note  Name: Alec Kelly  MRN: [id number]  DOB: 1966/07/28  Procedure: Right Heart Cath, Left Heart Cath, Selective Coronary Angiography, LV angiography, Aortic root angiography.  Indication: 48 yo WM with a bicuspid AV and severe aortic stenosis. EF 25-30%.  Procedural Details: The  right wrist was prepped, draped, and anesthetized with 1% lidocaine. Using the modified Seldinger technique a 6 Fr slender sheath was placed in the right radial artery and a 5 French sheath was placed in the right brachial vein. A Swan-Ganz catheter was used for the right heart catheterization. Standard protocol was followed for recording of right heart pressures and sampling of oxygen saturations. Fick cardiac output was calculated. Standard Judkins catheters were used for selective coronary angiography and left ventriculography. There were no immediate procedural complications. The patient was transferred to the post catheterization recovery area for further monitoring.  Procedural Findings:  Hemodynamics  RA 24/22 mean 20 mm Hg  RV 47/16 mm Hg  PA 41/31 mean 35 mm Hg  PCWP 27/25 mean 25 mm Hg  LV 164/24 mm Hg  AO 106/77 mean 95 mm Hg  AV gradient: Peak-58 mm Hg, mean 27 mm Hg  AV area- 1.5 cm squared with index 0.6.  Oxygen saturations:  PA 71%  AO 98%  Cardiac Output (Fick) 6.0 L/min  Cardiac Index (Fick) 2.57 L/min/meter squared.  Coronary angiography:  Coronary dominance: left  Left mainstem: Normal  Left anterior descending (LAD): Normal  Left circumflex (LCx): 20% ostial disease. Otherwise normal. Large dominant vessel.  Right coronary artery (RCA): small nondominant vessel. Normal.  Left ventriculography: Left ventricular systolic function is abnormal, LVEF is estimated at 25-30%. The LV is enlarged with global hypokinesis. There is no significant mitral regurgitation  Aortic root angiography: The AV is heavily calcified and deformed with reduced mobility. 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  Final Conclusions:  1. Normal coronary anatomy.  2. Severe LV dysfunction.  3. Severe aortic stenosis.  4. Proximal aortic root aneurysm.  5. Mild pulmonary HTN with elevated LV filling pressures.  Recommendations: Referral to CT surgery for Bentall procedure.   Peter M Martinique, [place]  05/15/2014, 3:57 PM  Impression:   He has moderate aortic stenosis by gradient and calculated valve area with severe LV dysfunction. His valve is very calcified and I suspect that he really has severe AS with low gradient due to low flow. He has some enlargement of the aortic root and ascending aorta by cath but this evaluation is incomplete. He may have a bicuspid valve which is more likely to be associated with aortic aneurysm. I think AVR is indicated for this symptomatic patient with severe LV dysfunction. CTA of the chest shows enlargement of the ascending aorta to 4.2 cm in the distal ascending aorta. The sinus segment is 3 cm and then the STJ enlarges to 3.7 cm. The proximal arch is also dilated to 4.2 cm and just beyond the left common carotid the aorta decreases to 2.4 cm. The descending aorta is 2.5 cm. He has aneurysmal enlargement of the ascending aorta and proximal arch with mild enlargement of the aortic root. He may have an associated bicuspid aortic valve. Since he is only 48 years old I would recommend replacing the aortic root, ascending aorta and proximal arch with grafts to the innominate and left common carotid artery. If this aorta is not replaced he will be at significantly increased risk of aortic dissection and progressive enlargement. I discussed the operative procedure of Bentall procedure and replacement of the ascending aorta and aortic arch with the patient and family including alternatives, benefits and risks; including but not limited to bleeding, blood transfusion, infection, stroke, myocardial infarction, graft failure, heart block requiring a permanent pacemaker, organ dysfunction, and death. I reviewed the recommendation for a mechanical valve given his age. Shantel Wesely Schlachter understands and agrees to proceed.   Plan:   Bentall procedure and replacement of the ascending aorta and aortic arch.

## 2014-06-02 NOTE — Anesthesia Postprocedure Evaluation (Signed)
  Anesthesia Post-op Note  Patient: Alec Kelly  Procedure(s) Performed: Procedure(s): BENTALL PROCEDURE: 50mm St Jude Valve Conduit  (N/A) Aortic Arch Replacement with 69mm Hemasheild Graft (N/A) INTRAOPERATIVE TRANSESOPHAGEAL ECHOCARDIOGRAM (N/A)  Patient Location: ICU  Anesthesia Type:General  Level of Consciousness: sedated and Patient remains intubated per anesthesia plan  Airway and Oxygen Therapy: Patient remains intubated per anesthesia plan and Patient placed on Ventilator (see vital sign flow sheet for setting)  Post-op Pain: none  Post-op Assessment: Post-op Vital signs reviewed, Patient's Cardiovascular Status Stable, Respiratory Function Stable, Patent Airway, No signs of Nausea or vomiting and Pain level controlled  Post-op Vital Signs: stable  Last Vitals:  Filed Vitals:   06/02/14 0552  BP: 129/82  Pulse: 62  Temp: 36.5 C  Resp: 18    Complications: No apparent anesthesia complications

## 2014-06-02 NOTE — Progress Notes (Signed)
  Echocardiogram Echocardiogram Transesophageal has been performed.  Alec Kelly 06/02/2014, 8:31 AM

## 2014-06-02 NOTE — Brief Op Note (Addendum)
06/02/2014      [address]       [place] [address]             [phone]      2:47 PM  PATIENT:  Alec Kelly  48 y.o. male  PRE-OPERATIVE DIAGNOSIS:  AS, TAA  POST-OPERATIVE DIAGNOSIS:  AS, TAA  PROCEDURE:  Procedure(s): BENTALL PROCEDURE Ascending aortic replacement #25 ST JUDE VALVE CONDUIT WITH # 30 HEMASHIELD GRAFT INTRAOPERATIVE TRANSESOPHAGEAL ECHOCARDIOGRAM TEE  SURGEON:  Surgeon(s): Gaye Pollack, MD  PHYSICIAN ASSISTANT: WAYNE GOLD PA-C  ANESTHESIA:   general  PATIENT CONDITION:  ICU - intubated and hemodynamically stable.  PRE-OPERATIVE WEIGHT: 498.2ME  COMPLICATIONS: NO KNOWN  GOLD,WAYNE E, PA-C  Aortic Valve  Procedure Performed:  Replacement: Yes.  Mechanical Valve. Implant Model Number:[id number], Size:25, Unique Device Identifier:[id number]  Repair/Reconstruction: Yes.  Replacement AV & major root reconstruction/debridement with valved conduit Bioprosthetic Valve. Implant Model Number:[id number], Size:25, Unique Device Identifier:[id number]  Aortic Annular Enlargement: No. Aortic Valve Etiology   Aortic Insufficiency:  no  Aortic Valve Disease:  Yes.  Aortic Stenosis:  Yes. Smallest Aortic Valve Area: 1cm2; Highest Mean Gradient: 60mmHg.  Etiology (Choose at least one and up to  5 etiologies):  Primary Aortic Disease, Atherosclerotic Aneurysm

## 2014-06-02 NOTE — Progress Notes (Signed)
Patient ID: Alec Kelly, male   DOB: 11-24-1966, 48 y.o.   MRN: [id number]   SICU Evening Rounds:   Hemodynamically stable , sinus rhythm CI = 2.34 on dop 5, milrinone 0.3  Still asleep on vent.  Urine output good  CT output low  CBC    Component Value Date/Time   WBC 18.3* 06/02/2014 1805   RBC 3.94* 06/02/2014 1805   HGB 12.0* 06/02/2014 1805   HCT 34.9* 06/02/2014 1805   PLT 176 06/02/2014 1805   MCV 88.6 06/02/2014 1805   MCH 30.5 06/02/2014 1805   MCHC 34.4 06/02/2014 1805   RDW 13.0 06/02/2014 1805   LYMPHSABS 1.8 05/12/2014 1239   MONOABS 0.5 05/12/2014 1239   EOSABS 0.2 05/12/2014 1239   BASOSABS 0.0 05/12/2014 1239     BMET    Component Value Date/Time   NA 139 06/02/2014 1742   K 5.1 06/02/2014 1742   CL 105 05/31/2014 1347   CO2 20 05/31/2014 1347   GLUCOSE 144* 06/02/2014 1742   BUN 14 05/31/2014 1347   CREATININE 1.15 05/31/2014 1347   CALCIUM 9.0 05/31/2014 1347   GFRNONAA 74* 05/31/2014 1347   GFRAA 86* 05/31/2014 1347     A/P:  Stable postop course. Continue current plans

## 2014-06-03 ENCOUNTER — Inpatient Hospital Stay (HOSPITAL_COMMUNITY): Payer: Self-pay

## 2014-06-03 LAB — POCT I-STAT 3, ART BLOOD GAS (G3+)
Acid-base deficit: 4 mmol/L — ABNORMAL HIGH (ref 0.0–2.0)
Acid-base deficit: 5 mmol/L — ABNORMAL HIGH (ref 0.0–2.0)
Acid-base deficit: 4 mmol/L — ABNORMAL HIGH (ref 0.0–2.0)
BICARBONATE: 20.6 meq/L (ref 20.0–24.0)
BICARBONATE: 22 meq/L (ref 20.0–24.0)
Bicarbonate: 22.1 mEq/L (ref 20.0–24.0)
O2 SAT: 91 %
O2 Saturation: 88 %
O2 Saturation: 95 %
PCO2 ART: 37.8 mmHg (ref 35.0–45.0)
PH ART: 7.342 — AB (ref 7.350–7.450)
PO2 ART: 64 mmHg — AB (ref 80.0–100.0)
PO2 ART: 79 mmHg — AB (ref 80.0–100.0)
Patient temperature: 36.6
Patient temperature: 36.8
Patient temperature: 36.6
TCO2: 22 mmol/L (ref 0–100)
TCO2: 23 mmol/L (ref 0–100)
TCO2: 23 mmol/L (ref 0–100)
pCO2 arterial: 40.5 mmHg (ref 35.0–45.0)
pCO2 arterial: 40.8 mmHg (ref 35.0–45.0)
pH, Arterial: 7.343 — ABNORMAL LOW (ref 7.350–7.450)
pH, Arterial: 7.338 — ABNORMAL LOW (ref 7.350–7.450)
pO2, Arterial: 56 mmHg — ABNORMAL LOW (ref 80.0–100.0)

## 2014-06-03 LAB — GLUCOSE, CAPILLARY
GLUCOSE-CAPILLARY: 132 mg/dL — AB (ref 70–99)
GLUCOSE-CAPILLARY: 109 mg/dL — AB (ref 70–99)
GLUCOSE-CAPILLARY: 111 mg/dL — AB (ref 70–99)
GLUCOSE-CAPILLARY: 121 mg/dL — AB (ref 70–99)
GLUCOSE-CAPILLARY: 112 mg/dL — AB (ref 70–99)
Glucose-Capillary: 112 mg/dL — ABNORMAL HIGH (ref 70–99)
Glucose-Capillary: 119 mg/dL — ABNORMAL HIGH (ref 70–99)
Glucose-Capillary: 120 mg/dL — ABNORMAL HIGH (ref 70–99)
Glucose-Capillary: 122 mg/dL — ABNORMAL HIGH (ref 70–99)
Glucose-Capillary: 125 mg/dL — ABNORMAL HIGH (ref 70–99)
Glucose-Capillary: 144 mg/dL — ABNORMAL HIGH (ref 70–99)
Glucose-Capillary: 144 mg/dL — ABNORMAL HIGH (ref 70–99)
Glucose-Capillary: 151 mg/dL — ABNORMAL HIGH (ref 70–99)
Glucose-Capillary: 161 mg/dL — ABNORMAL HIGH (ref 70–99)
Glucose-Capillary: 107 mg/dL — ABNORMAL HIGH (ref 70–99)
Glucose-Capillary: 108 mg/dL — ABNORMAL HIGH (ref 70–99)
Glucose-Capillary: 110 mg/dL — ABNORMAL HIGH (ref 70–99)
Glucose-Capillary: 123 mg/dL — ABNORMAL HIGH (ref 70–99)
Glucose-Capillary: 145 mg/dL — ABNORMAL HIGH (ref 70–99)

## 2014-06-03 LAB — CREATININE, SERUM
Creatinine, Ser: 1.03 mg/dL (ref 0.50–1.35)
Creatinine, Ser: 1 mg/dL (ref 0.50–1.35)
GFR calc non Af Amer: 88 mL/min — ABNORMAL LOW (ref >90)
GFR, EST NON AFRICAN AMERICAN: 85 mL/min — AB (ref >90)

## 2014-06-03 LAB — CBC
HCT: 29.8 % — ABNORMAL LOW (ref 39.0–52.0)
HEMATOCRIT: 31.3 % — AB (ref 39.0–52.0)
HEMATOCRIT: 32.6 % — AB (ref 39.0–52.0)
HEMOGLOBIN: 10.9 g/dL — AB (ref 13.0–17.0)
HEMOGLOBIN: 11.1 g/dL — AB (ref 13.0–17.0)
Hemoglobin: 10.4 g/dL — ABNORMAL LOW (ref 13.0–17.0)
MCH: 30.3 pg (ref 26.0–34.0)
MCH: 30.4 pg (ref 26.0–34.0)
MCH: 30.2 pg (ref 26.0–34.0)
MCHC: 34.8 g/dL (ref 30.0–36.0)
MCHC: 34.9 g/dL (ref 30.0–36.0)
MCHC: 34 g/dL (ref 30.0–36.0)
MCV: 86.9 fL (ref 78.0–100.0)
MCV: 87.1 fL (ref 78.0–100.0)
MCV: 88.8 fL (ref 78.0–100.0)
PLATELETS: 149 10*3/uL — AB (ref 150–400)
Platelets: 157 10*3/uL (ref 150–400)
Platelets: 127 10*3/uL — ABNORMAL LOW (ref 150–400)
RBC: 3.6 MIL/uL — ABNORMAL LOW (ref 4.22–5.81)
RBC: 3.42 MIL/uL — ABNORMAL LOW (ref 4.22–5.81)
RBC: 3.67 MIL/uL — ABNORMAL LOW (ref 4.22–5.81)
RDW: 12.9 % (ref 11.5–15.5)
RDW: 13 % (ref 11.5–15.5)
RDW: 13.4 % (ref 11.5–15.5)
WBC: 15.2 10*3/uL — ABNORMAL HIGH (ref 4.0–10.5)
WBC: 16.4 10*3/uL — ABNORMAL HIGH (ref 4.0–10.5)
WBC: 15.4 10*3/uL — ABNORMAL HIGH (ref 4.0–10.5)

## 2014-06-03 LAB — POCT I-STAT, CHEM 8
BUN: 15 mg/dL (ref 6–23)
CHLORIDE: 102 meq/L (ref 96–112)
Calcium, Ion: 1.15 mmol/L (ref 1.12–1.23)
Creatinine, Ser: 1.1 mg/dL (ref 0.50–1.35)
Glucose, Bld: 134 mg/dL — ABNORMAL HIGH (ref 70–99)
HEMATOCRIT: 34 % — AB (ref 39.0–52.0)
Hemoglobin: 11.6 g/dL — ABNORMAL LOW (ref 13.0–17.0)
POTASSIUM: 4.5 meq/L (ref 3.7–5.3)
Sodium: 141 mEq/L (ref 137–147)
TCO2: 25 mmol/L (ref 0–100)

## 2014-06-03 LAB — BASIC METABOLIC PANEL
BUN: 15 mg/dL (ref 6–23)
CO2: 23 mEq/L (ref 19–32)
CREATININE: 1.12 mg/dL (ref 0.50–1.35)
Calcium: 8.3 mg/dL — ABNORMAL LOW (ref 8.4–10.5)
Chloride: 105 mEq/L (ref 96–112)
GFR calc Af Amer: 89 mL/min — ABNORMAL LOW (ref >90)
GFR, EST NON AFRICAN AMERICAN: 77 mL/min — AB (ref >90)
GLUCOSE: 115 mg/dL — AB (ref 70–99)
POTASSIUM: 4.8 meq/L (ref 3.7–5.3)
Sodium: 143 mEq/L (ref 137–147)

## 2014-06-03 LAB — MAGNESIUM
Magnesium: 2.2 mg/dL (ref 1.5–2.5)
Magnesium: 2.1 mg/dL (ref 1.5–2.5)

## 2014-06-03 MED ORDER — FUROSEMIDE 10 MG/ML IJ SOLN
40.0000 mg | Freq: Once | INTRAMUSCULAR | Status: AC
  Administered 2014-06-03: 40 mg via INTRAVENOUS
  Filled 2014-06-03: qty 4

## 2014-06-03 MED ORDER — WARFARIN - PHYSICIAN DOSING INPATIENT
Freq: Every day | Status: DC
  Administered 2014-06-03 – 2014-06-08 (×3)

## 2014-06-03 MED ORDER — WARFARIN SODIUM 5 MG PO TABS
5.0000 mg | ORAL_TABLET | Freq: Once | ORAL | Status: AC
  Administered 2014-06-03: 5 mg via ORAL
  Filled 2014-06-03: qty 1

## 2014-06-03 MED ORDER — INSULIN DETEMIR 100 UNIT/ML ~~LOC~~ SOLN
30.0000 [IU] | Freq: Once | SUBCUTANEOUS | Status: AC
  Administered 2014-06-03: 30 [IU] via SUBCUTANEOUS
  Filled 2014-06-03: qty 0.3

## 2014-06-03 MED ORDER — INSULIN ASPART 100 UNIT/ML ~~LOC~~ SOLN
0.0000 [IU] | SUBCUTANEOUS | Status: DC
  Administered 2014-06-03 – 2014-06-05 (×8): 2 [IU] via SUBCUTANEOUS

## 2014-06-03 MED ORDER — ENOXAPARIN SODIUM 40 MG/0.4ML ~~LOC~~ SOLN
40.0000 mg | Freq: Every day | SUBCUTANEOUS | Status: DC
  Administered 2014-06-03 – 2014-06-04 (×2): 40 mg via SUBCUTANEOUS
  Filled 2014-06-03 (×3): qty 0.4

## 2014-06-03 NOTE — Progress Notes (Signed)
Patient ID: Alec Kelly, male   DOB: 1966-03-08, 47 y.o.   MRN: [id number]  SICU evening rounds:  Hemodynamically stable. Milrinone off. dop down to 2 mcg  Diuresing  Ambulated 100 ft.

## 2014-06-03 NOTE — Progress Notes (Signed)
1 Day Post-Op Procedure(s) (LRB): BENTALL PROCEDURE: 56mm St Jude Valve Conduit  (N/A) Aortic Arch Replacement with 16mm Hemasheild Graft (N/A) INTRAOPERATIVE TRANSESOPHAGEAL ECHOCARDIOGRAM (N/A) Subjective:  No complaints  Objective: Vital signs in last 24 hours: Temp:  [97.7 F (36.5 C)-99.3 F (37.4 C)] 98.1 F (36.7 C) (06/20 0715) Pulse Rate:  [48-107] 92 (06/20 0715) Cardiac Rhythm:  [-] Normal sinus rhythm (06/20 0730) Resp:  [12-35] 22 (06/20 0715) BP: (89-145)/(45-121) 112/54 mmHg (06/20 0700) SpO2:  [85 %-100 %] 96 % (06/20 0715) Arterial Line BP: (81-129)/(41-67) 113/51 mmHg (06/20 0715) FiO2 (%):  [40 %-100 %] 40 % (06/20 0016) Weight:  [128 kg (282 lb 3 oz)-137.9 kg (304 lb 0.2 oz)] 137.9 kg (304 lb 0.2 oz) (06/20 0500)  Hemodynamic parameters for last 24 hours: PAP: (24-50)/(10-29) 30/13 mmHg CO:  [5.5 L/min-6.9 L/min] 6.5 L/min CI:  [2.3 L/min/m2-2.9 L/min/m2] 2.7 L/min/m2  Intake/Output from previous day: 06/19 0701 - 06/20 0700 In: 9403.7 [I.V.:7589.7; GUYQI:347; NG/GT:20; IV Piggyback:1150] Out: 4259 [Urine:3550; Blood:1500; Chest Tube:350] Intake/Output this shift: Total I/O In: 120 [I.V.:20; IV Piggyback:100] Out: 130 [Urine:110; Chest Tube:20]  General appearance: still a little sedated on precedex but responds appropriately Neurologic: intact Heart: regular rate and rhythm and crisp mechanical valve sounds Lungs: clear to auscultation bilaterally Extremities: edema moderate Wound: dressing dry  Lab Results:  Recent Labs  06/02/14 1805 06/03/14 0415  WBC 18.3* 15.2*  HGB 12.0* 10.9*  HCT 34.9* 31.3*  PLT 176 157   BMET:  Recent Labs  05/31/14 1347  06/02/14 1742 06/03/14 0415  NA 140  < > 139 143  K 4.5  < > 5.1 4.8  CL 105  --   --  105  CO2 20  --   --  23  GLUCOSE 90  < > 144* 115*  BUN 14  --   --  15  CREATININE 1.15  --   --  1.12  CALCIUM 9.0  --   --  8.3*  < > = values in this interval not displayed.  PT/INR:  Recent  Labs  06/02/14 1805  LABPROT 17.1*  INR 1.43   ABG    Component Value Date/Time   PHART 7.338* 06/03/2014 0417   HCO3 22.0 06/03/2014 0417   TCO2 23 06/03/2014 0417   ACIDBASEDEF 4.0* 06/03/2014 0417   O2SAT 91.0 06/03/2014 0417   CBG (last 3)   Recent Labs  06/02/14 1838  GLUCAP 154*   CXR ok  ECG: NSR, LBBB old, no acute change  Assessment/Plan: S/P Procedure(s) (LRB): BENTALL PROCEDURE: 12mm St Jude Valve Conduit  (N/A) Aortic Arch Replacement with 77mm Hemasheild Graft (N/A) INTRAOPERATIVE TRANSESOPHAGEAL ECHOCARDIOGRAM (N/A)  Hemodynamically stable postop. Preop EF 25-30% Wean milrinone off and dopamine down to 3 DC femoral A-line now Can DC swan and radial A-line once inotropes weaned. Keep chest tubes in today. Start coumadin tonight for mechanical valve. OOB, IS Will give some Levemir today and continue SSI.    LOS: 1 day    BARTLE,BRYAN K 06/03/2014

## 2014-06-03 NOTE — Procedures (Signed)
Extubation Procedure Note  Patient Details:   Name: CAILEB RHUE DOB: [birth year] MRN: [id number]   Airway Documentation:  Airway 8 mm (Active)  Secured at (cm) 24 cm 06/03/2014 12:16 AM  Measured From Lips 06/03/2014 12:16 AM  Secured Location Right 06/03/2014 12:16 AM  Secured By Rana Snare Tape 06/03/2014 12:16 AM  Site Condition Dry 06/03/2014 12:00 AM    Evaluation  O2 sats: stable throughout Complications: No apparent complications Patient did tolerate procedure well. Bilateral Breath Sounds: Clear Suctioning: Airway Yes  Patients ABG was within normal limits on CPAP and PSV.  Patients NIF was -22 and VC was 752ml.  Patient was able to follow all commands prior to extubation.  RT suctioned patients mouth and down ET tube prior to extubation.  Patient was able to move air around the ET tube with a deflated cuff.  RT extubated patient and placed on 4L [place].  Patient was able to state name and speak clearly after extubation with Clear BBS; no stridor noted.  Patient was able to cough but the cough was weak.  RN and RT will continue to work with patient on coughing and deep breathing.    Marilynne Halsted 06/03/2014, 12:55 AM

## 2014-06-04 ENCOUNTER — Inpatient Hospital Stay (HOSPITAL_COMMUNITY): Payer: Self-pay

## 2014-06-04 LAB — GLUCOSE, CAPILLARY
GLUCOSE-CAPILLARY: 149 mg/dL — AB (ref 70–99)
GLUCOSE-CAPILLARY: 127 mg/dL — AB (ref 70–99)
GLUCOSE-CAPILLARY: 119 mg/dL — AB (ref 70–99)
GLUCOSE-CAPILLARY: 140 mg/dL — AB (ref 70–99)
Glucose-Capillary: 115 mg/dL — ABNORMAL HIGH (ref 70–99)
Glucose-Capillary: 125 mg/dL — ABNORMAL HIGH (ref 70–99)
Glucose-Capillary: 149 mg/dL — ABNORMAL HIGH (ref 70–99)

## 2014-06-04 LAB — BASIC METABOLIC PANEL WITH GFR
BUN: 17 mg/dL (ref 6–23)
CO2: 27 meq/L (ref 19–32)
Calcium: 8.4 mg/dL (ref 8.4–10.5)
Chloride: 100 meq/L (ref 96–112)
Creatinine, Ser: 0.92 mg/dL (ref 0.50–1.35)
GFR calc Af Amer: >90 mL/min
GFR calc non Af Amer: >90 mL/min
Glucose, Bld: 145 mg/dL — ABNORMAL HIGH (ref 70–99)
Potassium: 4.8 meq/L (ref 3.7–5.3)
Sodium: 139 meq/L (ref 137–147)

## 2014-06-04 LAB — CBC
HEMATOCRIT: 31.9 % — AB (ref 39.0–52.0)
HEMOGLOBIN: 10.7 g/dL — AB (ref 13.0–17.0)
MCH: 30.3 pg (ref 26.0–34.0)
MCHC: 33.5 g/dL (ref 30.0–36.0)
MCV: 90.4 fL (ref 78.0–100.0)
Platelets: 127 10*3/uL — ABNORMAL LOW (ref 150–400)
RBC: 3.53 MIL/uL — AB (ref 4.22–5.81)
RDW: 13.9 % (ref 11.5–15.5)
WBC: 14.7 10*3/uL — ABNORMAL HIGH (ref 4.0–10.5)

## 2014-06-04 LAB — PROTIME-INR
INR: 1.19 (ref 0.00–1.49)
PROTHROMBIN TIME: 14.8 s (ref 11.6–15.2)

## 2014-06-04 MED ORDER — FUROSEMIDE 10 MG/ML IJ SOLN
40.0000 mg | Freq: Once | INTRAMUSCULAR | Status: AC
  Administered 2014-06-04: 40 mg via INTRAVENOUS
  Filled 2014-06-04: qty 4

## 2014-06-04 MED ORDER — ASPIRIN 81 MG PO CHEW: 81.0000 mg | CHEWABLE_TABLET | Freq: Every day | ORAL | Status: DC

## 2014-06-04 MED ORDER — WARFARIN SODIUM 7.5 MG PO TABS
7.5000 mg | ORAL_TABLET | Freq: Every day | ORAL | Status: DC
  Administered 2014-06-04: 7.5 mg via ORAL
  Filled 2014-06-04 (×2): qty 1

## 2014-06-04 MED ORDER — ASPIRIN EC 81 MG PO TBEC
81.0000 mg | DELAYED_RELEASE_TABLET | Freq: Every day | ORAL | Status: DC
  Administered 2014-06-05: 81 mg via ORAL
  Filled 2014-06-04: qty 1

## 2014-06-04 NOTE — Progress Notes (Signed)
2 Days Post-Op Procedure(s) (LRB): BENTALL PROCEDURE: 88mm St Jude Valve Conduit  (N/A) Aortic Arch Replacement with 29mm Hemasheild Graft (N/A) INTRAOPERATIVE TRANSESOPHAGEAL ECHOCARDIOGRAM (N/A) Subjective:  No complaints  Objective: Vital signs in last 24 hours: Temp:  [97.9 F (36.6 C)-98.8 F (37.1 C)] 98.5 F (36.9 C) (06/21 0721) Pulse Rate:  [98-116] 99 (06/21 1000) Cardiac Rhythm:  [-] Normal sinus rhythm (06/21 0900) Resp:  [17-31] 21 (06/21 1000) BP: (98-138)/(62-95) 115/72 mmHg (06/21 1000) SpO2:  [91 %-98 %] 95 % (06/21 1000) Arterial Line BP: (116-128)/(63-69) 123/68 mmHg (06/20 1600) Weight:  [132.7 kg (292 lb 8.8 oz)] 132.7 kg (292 lb 8.8 oz) (06/21 0600)  Hemodynamic parameters for last 24 hours:    Intake/Output from previous day: 06/20 0701 - 06/21 0700 In: 1387.3 [P.O.:400; I.V.:837.3; IV Piggyback:150] Out: 3817 [Urine:3300; Chest Tube:490] Intake/Output this shift: Total I/O In: 324.4 [P.O.:200; I.V.:74.4; IV Piggyback:50] Out: 235 [Urine:205; Chest Tube:30]  General appearance: alert and cooperative Neurologic: intact Heart: regular rate and rhythm and crisp valve sounds Lungs: clear to auscultation bilaterally Abdomen: soft, non-tender; bowel sounds normal; no masses,  no organomegaly Extremities: edema mild Wound: dressing dry  Lab Results:  Recent Labs  06/03/14 1745 06/03/14 1752 06/04/14 0415  WBC 15.4*  --  14.7*  HGB 11.1* 11.6* 10.7*  HCT 32.6* 34.0* 31.9*  PLT 127*  --  127*   BMET:  Recent Labs  06/03/14 0415  06/03/14 1752 06/04/14 0415  NA 143  --  141 139  K 4.8  --  4.5 4.8  CL 105  --  102 100  CO2 23  --   --  27  GLUCOSE 115*  --  134* 145*  BUN 15  --  15 17  CREATININE 1.12  < > 1.10 0.92  CALCIUM 8.3*  --   --  8.4  < > = values in this interval not displayed.  PT/INR:  Recent Labs  06/04/14 0415  LABPROT 14.8  INR 1.19   ABG    Component Value Date/Time   PHART 7.338* 06/03/2014 0417   HCO3 22.0  06/03/2014 0417   TCO2 25 06/03/2014 1752   ACIDBASEDEF 4.0* 06/03/2014 0417   O2SAT 91.0 06/03/2014 0417   CBG (last 3)   Recent Labs  06/03/14 2353 06/04/14 0349 06/04/14 0719  GLUCAP 125* 149* 149*    Assessment/Plan: S/P Procedure(s) (LRB): BENTALL PROCEDURE: 78mm St Jude Valve Conduit  (N/A) Aortic Arch Replacement with 34mm Hemasheild Graft (N/A) INTRAOPERATIVE TRANSESOPHAGEAL ECHOCARDIOGRAM (N/A) Mobilize Diuresis Diabetes control d/c tubes/lines Continue foley due to diuresing patient and patient in ICU Coumadin 7.5 mg today.   LOS: 2 days    Gilford Raid K 06/04/2014

## 2014-06-04 NOTE — Progress Notes (Signed)
Patient ID: Alec Kelly, male   DOB: Dec 29, 1965, 48 y.o.   MRN: [id number]  SICU Evening Rounds:  Hemodynamically stable today  No problems

## 2014-06-05 LAB — GLUCOSE, CAPILLARY
Glucose-Capillary: 119 mg/dL — ABNORMAL HIGH (ref 70–99)
Glucose-Capillary: 128 mg/dL — ABNORMAL HIGH (ref 70–99)
Glucose-Capillary: 105 mg/dL — ABNORMAL HIGH (ref 70–99)
Glucose-Capillary: 116 mg/dL — ABNORMAL HIGH (ref 70–99)

## 2014-06-05 LAB — CBC
HCT: 32.1 % — ABNORMAL LOW (ref 39.0–52.0)
Hemoglobin: 10.3 g/dL — ABNORMAL LOW (ref 13.0–17.0)
MCH: 29.7 pg (ref 26.0–34.0)
MCHC: 32.1 g/dL (ref 30.0–36.0)
MCV: 92.5 fL (ref 78.0–100.0)
Platelets: 150 10*3/uL (ref 150–400)
RBC: 3.47 MIL/uL — ABNORMAL LOW (ref 4.22–5.81)
RDW: 13.6 % (ref 11.5–15.5)
WBC: 15.1 10*3/uL — ABNORMAL HIGH (ref 4.0–10.5)

## 2014-06-05 LAB — BASIC METABOLIC PANEL
BUN: 19 mg/dL (ref 6–23)
CO2: 32 mEq/L (ref 19–32)
CREATININE: 1.06 mg/dL (ref 0.50–1.35)
Calcium: 8.7 mg/dL (ref 8.4–10.5)
Chloride: 97 mEq/L (ref 96–112)
GFR calc Af Amer: >90 mL/min (ref >90)
GFR, EST NON AFRICAN AMERICAN: 82 mL/min — AB (ref >90)
Glucose, Bld: 122 mg/dL — ABNORMAL HIGH (ref 70–99)
POTASSIUM: 4.7 meq/L (ref 3.7–5.3)
Sodium: 137 mEq/L (ref 137–147)

## 2014-06-05 LAB — PROTIME-INR
INR: 1.22 (ref 0.00–1.49)
PROTHROMBIN TIME: 15.1 s (ref 11.6–15.2)

## 2014-06-05 MED ORDER — OXYCODONE HCL 5 MG PO TABS
5.0000 mg | ORAL_TABLET | ORAL | Status: DC | PRN
  Administered 2014-06-06 – 2014-06-07 (×4): 10 mg via ORAL
  Administered 2014-06-07: 5 mg via ORAL
  Administered 2014-06-08 – 2014-06-12 (×14): 10 mg via ORAL
  Administered 2014-06-13: 5 mg via ORAL
  Filled 2014-06-05 (×8): qty 2
  Filled 2014-06-05: qty 1
  Filled 2014-06-05 (×11): qty 2

## 2014-06-05 MED ORDER — TRAMADOL HCL 50 MG PO TABS
50.0000 mg | ORAL_TABLET | ORAL | Status: DC | PRN
  Administered 2014-06-05 – 2014-06-09 (×7): 100 mg via ORAL
  Filled 2014-06-05 (×8): qty 2

## 2014-06-05 MED ORDER — ACETAMINOPHEN 325 MG PO TABS
650.0000 mg | ORAL_TABLET | Freq: Four times a day (QID) | ORAL | Status: DC | PRN
  Administered 2014-06-07: 1000 mg via ORAL
  Administered 2014-06-10: 650 mg via ORAL
  Filled 2014-06-05: qty 2

## 2014-06-05 MED ORDER — SODIUM CHLORIDE 0.9 % IV SOLN: 250.0000 mL | INTRAVENOUS | Status: DC | PRN

## 2014-06-05 MED ORDER — FUROSEMIDE 40 MG PO TABS
40.0000 mg | ORAL_TABLET | Freq: Every day | ORAL | Status: DC
  Administered 2014-06-05 – 2014-06-07 (×3): 40 mg via ORAL
  Filled 2014-06-05 (×3): qty 1

## 2014-06-05 MED ORDER — ONDANSETRON HCL 4 MG/2ML IJ SOLN: 4.0000 mg | Freq: Four times a day (QID) | INTRAMUSCULAR | Status: DC | PRN

## 2014-06-05 MED ORDER — WARFARIN SODIUM 7.5 MG PO TABS
7.5000 mg | ORAL_TABLET | Freq: Once | ORAL | Status: AC
  Administered 2014-06-05: 7.5 mg via ORAL
  Filled 2014-06-05: qty 1

## 2014-06-05 MED ORDER — SODIUM CHLORIDE 0.9 % IJ SOLN
3.0000 mL | INTRAMUSCULAR | Status: DC | PRN
Start: 2014-06-05 — End: 2014-06-13
  Administered 2014-06-06: 3 mL via INTRAVENOUS

## 2014-06-05 MED ORDER — ONDANSETRON HCL 4 MG PO TABS: 4.0000 mg | ORAL_TABLET | Freq: Four times a day (QID) | ORAL | Status: DC | PRN

## 2014-06-05 MED ORDER — ASPIRIN EC 81 MG PO TBEC
81.0000 mg | DELAYED_RELEASE_TABLET | Freq: Every day | ORAL | Status: DC
  Administered 2014-06-05 – 2014-06-13 (×9): 81 mg via ORAL
  Filled 2014-06-05 (×9): qty 1

## 2014-06-05 MED ORDER — METOPROLOL TARTRATE 12.5 MG HALF TABLET
12.5000 mg | ORAL_TABLET | Freq: Two times a day (BID) | ORAL | Status: DC
  Administered 2014-06-05 (×2): 12.5 mg via ORAL
  Filled 2014-06-05 (×4): qty 1

## 2014-06-05 MED ORDER — MOVING RIGHT ALONG BOOK
Freq: Once | Status: AC
  Administered 2014-06-05: 12:00:00
  Filled 2014-06-05: qty 1

## 2014-06-05 MED ORDER — PATIENT'S GUIDE TO USING COUMADIN BOOK
Freq: Once | Status: AC
  Administered 2014-06-05: 18:00:00
  Filled 2014-06-05: qty 1

## 2014-06-05 MED ORDER — SODIUM CHLORIDE 0.9 % IJ SOLN
3.0000 mL | Freq: Two times a day (BID) | INTRAMUSCULAR | Status: DC
  Administered 2014-06-05 – 2014-06-13 (×14): 3 mL via INTRAVENOUS

## 2014-06-05 MED ORDER — WARFARIN VIDEO: Freq: Once | Status: DC

## 2014-06-05 MED ORDER — PANTOPRAZOLE SODIUM 40 MG PO TBEC
40.0000 mg | DELAYED_RELEASE_TABLET | Freq: Every day | ORAL | Status: DC
  Administered 2014-06-06 – 2014-06-13 (×8): 40 mg via ORAL
  Filled 2014-06-05 (×9): qty 1

## 2014-06-05 MED ORDER — POTASSIUM CHLORIDE CRYS ER 20 MEQ PO TBCR
20.0000 meq | EXTENDED_RELEASE_TABLET | Freq: Every day | ORAL | Status: DC
  Administered 2014-06-06 – 2014-06-07 (×2): 20 meq via ORAL
  Filled 2014-06-05 (×3): qty 1

## 2014-06-05 MED FILL — Heparin Sodium (Porcine) Inj 1000 Unit/ML: INTRAMUSCULAR | Qty: 30 | Status: AC

## 2014-06-05 MED FILL — Potassium Chloride Inj 2 mEq/ML: INTRAVENOUS | Qty: 40 | Status: AC

## 2014-06-05 MED FILL — Magnesium Sulfate Inj 50%: INTRAMUSCULAR | Qty: 10 | Status: AC

## 2014-06-05 NOTE — Progress Notes (Signed)
CARDIAC REHAB PHASE I   PRE:  Rate/Rhythm: 95 BBB 1HB SR  BP:  Supine: 104/70  Sitting:   Standing:    SaO2: 92%RA  MODE:  Ambulation: 300 ft   POST:  Rate/Rhythm: 116  BP:  Supine:   Sitting: 120/70  Standing:    SaO2: 98%RA 1425-1450 Pt walked 300 ft on RA with rolling walker and asst x 1. Steady when up but needs asst when getting up and down. Back to bed after walk. Sleepy. Encouraged third walk today.   Graylon Good, RN BSN  06/05/2014 2:48 PM

## 2014-06-05 NOTE — Progress Notes (Signed)
3 Days Post-Op Procedure(s) (LRB): BENTALL PROCEDURE: 35mm St Jude Valve Conduit  (N/A) Aortic Arch Replacement with 56mm Hemasheild Graft (N/A) INTRAOPERATIVE TRANSESOPHAGEAL ECHOCARDIOGRAM (N/A) Subjective: No complaints, bowels working  Objective: Vital signs in last 24 hours: Temp:  [97.5 F (36.4 C)-98.6 F (37 C)] 98.4 F (36.9 C) (06/22 0326) Pulse Rate:  [85-101] 95 (06/22 0600) Cardiac Rhythm:  [-] Sinus tachycardia (06/21 2000) Resp:  [16-31] 19 (06/22 0600) BP: (103-126)/(65-78) 108/68 mmHg (06/22 0600) SpO2:  [91 %-98 %] 96 % (06/22 0600) Weight:  [131 kg (288 lb 12.8 oz)] 131 kg (288 lb 12.8 oz) (06/22 0600)  Hemodynamic parameters for last 24 hours:    Intake/Output from previous day: 06/21 0701 - 06/22 0700 In: 1141.6 [P.O.:950; I.V.:141.6; IV Piggyback:50] Out: 7867 [JQGBE:0100; Chest Tube:30] Intake/Output this shift:    General appearance: alert and cooperative Neurologic: intact Heart: regular rate and rhythm Lungs: clear to auscultation bilaterally Abdomen: soft, non-tender; bowel sounds normal; no masses,  no organomegaly Extremities: edema mild Wound: dressings dry  Lab Results:  Recent Labs  06/04/14 0415 06/05/14 0220  WBC 14.7* 15.1*  HGB 10.7* 10.3*  HCT 31.9* 32.1*  PLT 127* 150   BMET:  Recent Labs  06/04/14 0415 06/05/14 0220  NA 139 137  K 4.8 4.7  CL 100 97  CO2 27 32  GLUCOSE 145* 122*  BUN 17 19  CREATININE 0.92 1.06  CALCIUM 8.4 8.7    PT/INR:  Recent Labs  06/05/14 0220  LABPROT 15.1  INR 1.22   ABG    Component Value Date/Time   PHART 7.338* 06/03/2014 0417   HCO3 22.0 06/03/2014 0417   TCO2 25 06/03/2014 1752   ACIDBASEDEF 4.0* 06/03/2014 0417   O2SAT 91.0 06/03/2014 0417   CBG (last 3)   Recent Labs  06/04/14 1914 06/04/14 2306 06/05/14 0325  GLUCAP 140* 115* 119*    Assessment/Plan: S/P Procedure(s) (LRB): BENTALL PROCEDURE: 61mm St Jude Valve Conduit  (N/A) Aortic Arch Replacement with 11mm  Hemasheild Graft (N/A) INTRAOPERATIVE TRANSESOPHAGEAL ECHOCARDIOGRAM (N/A) Mobilize Diuresis Diabetes control Plan for transfer to step-down: see transfer orders Coumadin 7.5 mg today for mechanical valve.   LOS: 3 days    Gilford Raid K 06/05/2014

## 2014-06-06 ENCOUNTER — Encounter (HOSPITAL_COMMUNITY): Payer: Self-pay | Admitting: Surgery

## 2014-06-06 ENCOUNTER — Inpatient Hospital Stay (HOSPITAL_COMMUNITY): Payer: Self-pay

## 2014-06-06 LAB — CBC
HEMATOCRIT: 29.5 % — AB (ref 39.0–52.0)
HEMOGLOBIN: 9.8 g/dL — AB (ref 13.0–17.0)
MCH: 30.5 pg (ref 26.0–34.0)
MCHC: 33.2 g/dL (ref 30.0–36.0)
MCV: 91.9 fL (ref 78.0–100.0)
Platelets: 203 10*3/uL (ref 150–400)
RBC: 3.21 MIL/uL — ABNORMAL LOW (ref 4.22–5.81)
RDW: 13.3 % (ref 11.5–15.5)
WBC: 12.7 10*3/uL — ABNORMAL HIGH (ref 4.0–10.5)

## 2014-06-06 LAB — GLUCOSE, CAPILLARY: GLUCOSE-CAPILLARY: 114 mg/dL — AB (ref 70–99)

## 2014-06-06 LAB — BASIC METABOLIC PANEL
BUN: 22 mg/dL (ref 6–23)
CO2: 31 mEq/L (ref 19–32)
CREATININE: 1.07 mg/dL (ref 0.50–1.35)
Calcium: 8.8 mg/dL (ref 8.4–10.5)
Chloride: 94 mEq/L — ABNORMAL LOW (ref 96–112)
GFR calc Af Amer: >90 mL/min (ref >90)
GFR calc non Af Amer: 81 mL/min — ABNORMAL LOW (ref >90)
GLUCOSE: 111 mg/dL — AB (ref 70–99)
POTASSIUM: 4.4 meq/L (ref 3.7–5.3)
Sodium: 137 mEq/L (ref 137–147)

## 2014-06-06 LAB — PROTIME-INR
INR: 1.29 (ref 0.00–1.49)
Prothrombin Time: 15.8 seconds — ABNORMAL HIGH (ref 11.6–15.2)

## 2014-06-06 MED ORDER — METOPROLOL TARTRATE 25 MG PO TABS
25.0000 mg | ORAL_TABLET | Freq: Two times a day (BID) | ORAL | Status: DC
  Administered 2014-06-06 – 2014-06-13 (×15): 25 mg via ORAL
  Filled 2014-06-06 (×16): qty 1

## 2014-06-06 MED ORDER — WARFARIN SODIUM 10 MG PO TABS
10.0000 mg | ORAL_TABLET | Freq: Every day | ORAL | Status: DC
  Administered 2014-06-06 – 2014-06-08 (×3): 10 mg via ORAL
  Filled 2014-06-06 (×7): qty 1

## 2014-06-06 MED FILL — Sodium Bicarbonate IV Soln 8.4%: INTRAVENOUS | Qty: 50 | Status: AC

## 2014-06-06 MED FILL — Electrolyte-R (PH 7.4) Solution: INTRAVENOUS | Qty: 4000 | Status: AC

## 2014-06-06 MED FILL — Mannitol IV Soln 20%: INTRAVENOUS | Qty: 500 | Status: AC

## 2014-06-06 MED FILL — Lidocaine HCl IV Inj 20 MG/ML: INTRAVENOUS | Qty: 5 | Status: AC

## 2014-06-06 MED FILL — Sodium Chloride IV Soln 0.9%: INTRAVENOUS | Qty: 2000 | Status: AC

## 2014-06-06 MED FILL — Heparin Sodium (Porcine) Inj 1000 Unit/ML: INTRAMUSCULAR | Qty: 10 | Status: AC

## 2014-06-06 NOTE — Discharge Instructions (Signed)
Aortic Valve Replacement Care After Refer to this sheet in the next few weeks. These instructions provide you with information on caring for yourself after your procedure. Your health care provider may also give you specific instructions. Your treatment has been planned according to current medical practices, but problems sometimes occur. Call your health care provider if you have any problems or questions after your procedure. HOME CARE INSTRUCTIONS   Only take over-the-counter or prescription medicines as directed by your health care provider.  If your health care provider has prescribed elastic stockings, wear them as directed.  Take frequent naps or rest often throughout the day.  Avoid lifting over 10 lbs (4.5 kg) or pushing or pulling things with your arms for 6-8 weeks or as directed by your health care provider.  Avoid driving or airplane travel for 4-6 weeks after surgery or as directed. If you are riding in a car for an extended period, stop every 1-2 hours to stretch your legs. Keep a record of your medicines and medical history with you when traveling. Do not drive while taking pain medicines (narcotics).  Do not cross your legs.  Do not use any tobacco products including cigarettes, chewing tobacco, or electronic cigarettes.  Do not bathe, swim, or use a hot tub until directed by your health care provider. Take showers once your health care provider approves. Pat incisions dry. Do not rub incisions with a washcloth or towel.  Avoid climbing stairs and using the handrail to pull yourself up for the first 2-3 weeks after surgery.  Return to work as directed by your health care provider.  Drink enough fluids to keep your urine clear or pale yellow.  Do not strain to have a bowel movement. Eat high-fiber foods if you become constipated. You may also take a medicine to help you have a bowel movement (laxative) as directed by your health care provider.  Resume sexual activity as  directed by your health care provider. Men should not use medicines for erectile dysfunction until their doctor says it isokay.  If you had a certain type of heart condition in the past, you may need to take antibiotic medicine before having dental work or surgery. Let your dentist and health care providers know if you had one or more of the following:  Previous endocarditis.  An artificial (prosthetic) heart valve.  Congenital heart disease. SEEK MEDICAL CARE IF:  You develop a skin rash.   You experience sudden changes in your weight. SEEK IMMEDIATE MEDICAL CARE IF:   You develop chest pain that is not coming from your incision.   You develop shortness of breath or have difficulty breathing.   You have a fever.   You have increased bleeding from your wounds.   You have increasing wound pain.   You have redness or swelling around your wounds  You have pus coming from your wound.   You develop lightheadedness.  MAKE SURE YOU:   Understand these directions.  Will watch your condition.  Will get help right away if you are not doing well or get worse. Document Released: 06/19/2005 Document Revised: 12/06/2013 Document Reviewed: 09/14/2012 [facility] Patient Information 2015 [place], [place]. This information is not intended to replace advice given to you by your health care provider. Make sure you discuss any questions you have with your health care provider.  Warfarin: What You Need to Know Warfarin is an anticoagulant. Anticoagulants help prevent the formation of blood clots. They also help stop the growth of blood  clots. Warfarin is sometimes referred to as a "blood thinner."  Normally, when body tissues are cut or damaged, the blood clots in order to prevent blood loss. Sometimes clots form inside your blood vessels and obstruct the flow of blood through your circulatory system (thrombosis). These clots may travel through your bloodstream and become lodged in smaller  blood vessels in your brain, which can cause a stroke, or in your lungs (pulmonary embolism). WHO SHOULD USE WARFARIN? Warfarin is prescribed for people at risk of developing harmful blood clots:  People with surgically implanted mechanical heart valves, irregular heart rhythms called atrial fibrillation, and certain clotting disorders.  People who have developed harmful blood clotting in the past, including those who have had a stroke or a pulmonary embolism, or thrombosis in their legs (deep vein thrombosis [DVT]).  People with an existing blood clot, such as a pulmonary embolism. WARFARIN DOSING Warfarin tablets come in different strengths. Each tablet strength is a different color, with the amount of warfarin (in milligrams) clearly printed on the tablet. If the color of your tablet is different than usual when you receive a new prescription, report it immediately to your pharmacist or health care provider. WARFARIN MONITORING The goal of warfarin therapy is to lessen the clotting tendency of blood but not prevent clotting completely. Your health care provider will monitor the anticoagulation effect of warfarin closely and adjust your dose as needed. For your safety, blood tests called prothrombin time (PT) or international normalized ratio (INR) are used to measure the effects of warfarin. Both of these tests can be done with a finger stick or a blood draw. The longer it takes the blood to clot, the higher the PT or INR. Your health care provider will inform you of your "target" PT or INR range. If, at any time, your PT or INR is above the target range, there is a risk of bleeding. If your PT or INR is below the target range, there is a risk of clotting. Whether you are started on warfarin while you are in the hospital or in your health care provider's office, you will need to have your PT or INR checked within one week of starting the medicine. Initially, some people are asked to have their PT or  INR checked as much as twice a week. Once you are on a stable maintenance dose, the PT or INR is checked less often, usually once every 2 to 4 weeks. The warfarin dose may be adjusted if the PT or INR is not within the target range. It is important to keep all laboratory and health care provider follow-up appointments. Not keeping appointments could result in a chronic or permanent injury, pain, or disability because warfarin is a medicine that requires close monitoring. WHAT ARE THE SIDE EFFECTS OF WARFARIN?  Too much warfarin can cause bleeding (hemorrhage) from any part of the body. This may include bleeding from the gums, blood in the urine, bloody or dark stools, a nosebleed that is not easily stopped, coughing up blood, or vomiting blood.  Too little warfarin can increase the risk of blood clots.  Too little or too much warfarin can also increase the risk of a stroke.  Warfarin use may cause a skin rash or irritation, an unusual fever, continual nausea or stomach upset, or severe pain in your joints or back. SPECIAL PRECAUTIONS WHILE TAKING WARFARIN Warfarin should be taken exactly as directed. It is very important to take warfarin as directed since bleeding or blood  clots could result in chronic or permanent injury, pain, or disability.  Take your medicine at the same time every day. If you forget to take your dose, you can take it if it is within 6 hours of when it was due.  Do not change the dose of warfarin on your own to make up for missed or extra doses.  If you miss more than 2 doses in a row, you should contact your health care provider for advice. Avoid situations that cause bleeding. You may have a tendency to bleed more easily than usual while taking warfarin. The following actions can limit bleeding:  Using a softer toothbrush.  Flossing with waxed floss rather than unwaxed floss.  Shaving with an Copy rather than a blade.  Limiting the use of sharp  objects.  Avoiding potentially harmful activities, such as contact sports. Warfarin and Pregnancy or Breastfeeding  Warfarin is not advised during the first trimester of pregnancy due to an increased risk of birth defects. In certain situations, a woman may take warfarin after her first trimester of pregnancy. A woman who becomes pregnant or plans to become pregnant while taking warfarin should notify her health care provider immediately.  Although warfarin does not pass into breast milk, a woman who wishes to breastfeed while taking warfarin should also consult with her health care provider. Alcohol, Smoking, and Illicit Drug Use  Alcohol affects how warfarin works in the body. It is best to avoid alcoholic drinks or consume very small amounts while taking warfarin. In general, alcohol intake should be limited to 1 oz (30 mL) of liquor, 6 oz (180 mL) of wine, or 12 oz (360 mL) of beer each day. Notify your health care provider if you change your alcohol intake.  Smoking affects how warfarin works. It is best to avoid smoking while taking warfarin. Notify your health care provider if you change your smoking habits.  It is best to avoid all illicit drugs while taking warfarin since there are few studies that show how warfarin interacts with these drugs. Other Medicines and Dietary Supplements Many prescription and over-the-counter medicines can interfere with warfarin. Be sure all of your health care providers know you are taking warfarin. Notify your health care provider who prescribed warfarin for you or your pharmacist before starting or stopping any new medicines, including over-the-counter vitamins, dietary supplements, and pain medicines. Your warfarin dose may need to be adjusted. Some common over-the-counter medicines that may increase the risk of bleeding while taking warfarin include:   Acetaminophen.  Aspirin.  Nonsteroidal anti-inflammatory medicines (NSAIDs), such as ibuprofen or  naproxen.  Vitamin E. Dietary Considerations  Foods that have moderate or high amounts of vitamin K can interfere with warfarin. Avoid major changes in your diet or notify your health care provider before changing your diet. Eat a consistent amount of foods that have moderate or high amounts of vitamin K. Eating less foods containing vitamin K can increase the risk of bleeding. Eating more foods containing vitamin K can increase the risk of blood clots. Additional questions about dietary considerations can be discussed with a dietitian. Foods that are very high in vitamin K:  Greens, such as Swiss chard and beet, collard, mustard, or turnip greens (fresh or frozen, cooked).  Kale (fresh or frozen, cooked).  Parsley (raw).  Spinach (cooked). Foods that are high in vitamin K:  Asparagus (frozen, cooked).  Beans, green (frozen, cooked).  Broccoli.  Bok choy (cooked).  Brussels sprouts (fresh or frozen, cooked).  Cabbage (cooked).   Coleslaw. Foods that are moderately high in vitamin K:  Blueberries.  Black-eyed peas.  Endive (raw).  Green leaf lettuce (raw).  Green scallions (raw).  Kale (raw).  Okra (frozen, cooked).  Plantains (fried).  Romaine lettuce (raw).  Sauerkraut (canned).  Spinach (raw). CALL YOUR CLINIC OR HEALTH CARE PROVIDER IF YOU:  Plan to have any surgery or procedure.  Feel sick, especially if you have diarrhea or vomiting.  Experience or anticipate any major changes in your diet.  Start or stop a prescription or over-the-counter medicine.  Become, plan to become, or think you may be pregnant.  Are having heavier than usual menstrual periods.  Have had a fall, accident, or any symptoms of bleeding or unusual bruising.  Develop an unusual fever. CALL 911 IN THE [place] OR GO TO THE EMERGENCY DEPARTMENT IF YOU:   Think you may be having an allergic reaction to warfarin. The signs of an allergic reaction could include itching, rash,  hives, swelling, chest tightness, or trouble breathing.  See signs of blood in your urine. The signs could include reddish, pinkish, or tea-colored urine.  See signs of blood in your stools. The signs could include bright red or black stools.  Vomit or cough up blood. In these instances, the blood could have either a bright red or a "coffee-grounds" appearance.  Have bleeding that will not stop after applying pressure for 30 minutes such as cuts, nosebleeds, or other injuries.  Have severe pain in your joints or back.  Have a new and severe headache.  Have sudden weakness or numbness of your face, arm, or leg, especially on one side of your body.  Have sudden confusion or trouble understanding.  Have sudden trouble seeing in one or both eyes.  Have sudden trouble walking, dizziness, loss of balance, or coordination.  Have trouble speaking or understanding (aphasia). Document Released: 12/01/2005 Document Revised: 12/06/2013 Document Reviewed: 05/27/2013 [facility] Patient Information 2015 [place], [place]. This information is not intended to replace advice given to you by your health care provider. Make sure you discuss any questions you have with your health care provider. Information on my medicine - Coumadin   (Warfarin)  This medication education was reviewed with me or my healthcare representative as part of my discharge preparation.  The pharmacist that spoke with me during my hospital stay was:  Eudelia Bunch, [facility]  Why was Coumadin prescribed for you? Coumadin was prescribed for you because you have a blood clot or a medical condition that can cause an increased risk of forming blood clots. Blood clots can cause serious health problems by blocking the flow of blood to the heart, lung, or brain. Coumadin can prevent harmful blood clots from forming. As a reminder your indication for Coumadin is:   Blood Clot Prevention After Heart Valve Surgery  What test will check on my  response to Coumadin? While on Coumadin (warfarin) you will need to have an INR test regularly to ensure that your dose is keeping you in the desired range. The INR (international normalized ratio) number is calculated from the result of the laboratory test called prothrombin time (PT).  If an INR APPOINTMENT HAS NOT ALREADY BEEN MADE FOR YOU please schedule an appointment to have this lab work done by your health care provider within 7 days. Your INR goal is usually a number between:  2 to 3 or your provider may give you a more narrow range like 2-2.5.  Ask your health care  provider during an office visit what your goal INR is.  What  do you need to  know  About  COUMADIN? Take Coumadin (warfarin) exactly as prescribed by your healthcare provider about the same time each day.  DO NOT stop taking without talking to the doctor who prescribed the medication.  Stopping without other blood clot prevention medication to take the place of Coumadin may increase your risk of developing a new clot or stroke.  Get refills before you run out.  What do you do if you miss a dose? If you miss a dose, take it as soon as you remember on the same day then continue your regularly scheduled regimen the next day.  Do not take two doses of Coumadin at the same time.  Important Safety Information A possible side effect of Coumadin (Warfarin) is an increased risk of bleeding. You should call your healthcare provider right away if you experience any of the following:   Bleeding from an injury or your nose that does not stop.   Unusual colored urine (red or dark brown) or unusual colored stools (red or black).   Unusual bruising for unknown reasons.   A serious fall or if you hit your head (even if there is no bleeding).  Some foods or medicines interact with Coumadin (warfarin) and might alter your response to warfarin. To help avoid this:   Eat a balanced diet, maintaining a consistent amount of Vitamin K.   Notify  your provider about major diet changes you plan to make.   Avoid alcohol or limit your intake to 1 drink for women and 2 drinks for men per day. (1 drink is 5 oz. wine, 12 oz. beer, or 1.5 oz. liquor.)  Make sure that ANY health care provider who prescribes medication for you knows that you are taking Coumadin (warfarin).  Also make sure the healthcare provider who is monitoring your Coumadin knows when you have started a new medication including herbals and non-prescription products.  Coumadin (Warfarin)  Major Drug Interactions  Increased Warfarin Effect Decreased Warfarin Effect  Alcohol (large quantities) Antibiotics (esp. Septra/Bactrim, Flagyl, Cipro) Amiodarone (Cordarone) Aspirin (ASA) Cimetidine (Tagamet) Megestrol (Megace) NSAIDs (ibuprofen, naproxen, etc.) Piroxicam (Feldene) Propafenone (Rythmol SR) Propranolol (Inderal) Isoniazid (INH) Posaconazole (Noxafil) Barbiturates (Phenobarbital) Carbamazepine (Tegretol) Chlordiazepoxide (Librium) Cholestyramine (Questran) Griseofulvin Oral Contraceptives Rifampin Sucralfate (Carafate) Vitamin K   Coumadin (Warfarin) Major Herbal Interactions  Increased Warfarin Effect Decreased Warfarin Effect  Garlic Ginseng Ginkgo biloba Coenzyme Q10 Green tea St. Johns wort    Coumadin (Warfarin) FOOD Interactions  Eat a consistent number of servings per week of foods HIGH in Vitamin K (1 serving =  cup)  Collards (cooked, or boiled & drained) Kale (cooked, or boiled & drained) Mustard greens (cooked, or boiled & drained) Parsley *serving size only =  cup Spinach (cooked, or boiled & drained) Swiss chard (cooked, or boiled & drained) Turnip greens (cooked, or boiled & drained)  Eat a consistent number of servings per week of foods MEDIUM-HIGH in Vitamin K (1 serving = 1 cup)  Asparagus (cooked, or boiled & drained) Broccoli (cooked, boiled & drained, or raw & chopped) Brussel sprouts (cooked, or boiled & drained)  *serving size only =  cup Lettuce, raw (green leaf, endive, romaine) Spinach, raw Turnip greens, raw & chopped   These websites have more information on Coumadin (warfarin):  FailFactory.se; VeganReport.com.au;

## 2014-06-06 NOTE — Progress Notes (Addendum)
      [address]       [place] [id number]             [phone]        4 Days Post-Op Procedure(s) (LRB): BENTALL PROCEDURE: 21mm St Jude Valve Conduit  (N/A) Aortic Arch Replacement with 29mm Hemasheild Graft (N/A) INTRAOPERATIVE TRANSESOPHAGEAL ECHOCARDIOGRAM (N/A)  Subjective: Patient with complaints of posterior right shoulder pain  Objective: Vital signs in last 24 hours: Temp:  [97.6 F (36.4 C)-98.8 F (37.1 C)] 97.9 F (36.6 C) (06/23 0526) Pulse Rate:  [90-98] 90 (06/23 0526) Cardiac Rhythm:  [-] Normal sinus rhythm;Bundle branch block (06/22 2143) Resp:  [18] 18 (06/23 0526) BP: (112-119)/(67-76) 114/67 mmHg (06/23 0526) SpO2:  [93 %-96 %] 93 % (06/23 0526) Weight:  [288 lb 9.3 oz (130.9 kg)] 288 lb 9.3 oz (130.9 kg) (06/23 0526)  Pre op weight 127.9 kg Current Weight  06/06/14 288 lb 9.3 oz (130.9 kg)      Intake/Output from previous day: 06/22 0701 - 06/23 0700 In: 700 [P.O.:700] Out: 550 [Urine:550]   Physical Exam:  Cardiovascular: RRR, no murmurs, gallops, or rubs. Pulmonary: Diminished at bases; no rales, wheezes, or rhonchi. Abdomen: Soft, non tender, bowel sounds present. Extremities: Mild bilateral lower extremity edema. Tenderness (no warmth)posterior right shoulder. Wounds: Clean and dry.  No erythema or signs of infection.  Lab Results: CBC: Recent Labs  06/05/14 0220 06/06/14 0315  WBC 15.1* 12.7*  HGB 10.3* 9.8*  HCT 32.1* 29.5*  PLT 150 203   BMET:  Recent Labs  06/05/14 0220 06/06/14 0315  NA 137 137  K 4.7 4.4  CL 97 94*  CO2 32 31  GLUCOSE 122* 111*  BUN 19 22  CREATININE 1.06 1.07  CALCIUM 8.7 8.8    PT/INR:  Lab Results  Component Value Date   INR 1.29 06/06/2014   INR 1.22 06/05/2014   INR 1.19 06/04/2014   ABG:  INR: Will add last result for INR, ABG once components are confirmed Will add last 4 CBG results once components are confirmed  Assessment/Plan:  1. CV - SR in the 90's.  On Lopressor 12.5 bid and Coumadin. INR slightly increased to 1.29. Will increase Coumadin. Will increase Lopressor for better HR control. 2.  Pulmonary - CXR appears to show no pneumothorax, some atelectasis at left base, trace bilateral pleural effusions.Encourage incentive spirometer and flutter valve 3. Volume Overload - On Lasix 40 daily 4.  Acute blood loss anemia - H and H 9.8 and 29.5 5. Remove EPW 6. Ice PRN to right shoulder 7. Will be ready for discharge when INR closer to 2 as has mechanical valve  ZIMMERMAN,DONIELLE MPA-C 06/06/2014,7:32 AM   Chart reviewed, patient examined, agree with above. I think the right shoulder pain is related to the axillary artery exposure. He will need to have INR 2 before going home especially since it appears that he is going to need higher dose coumadin.

## 2014-06-06 NOTE — Progress Notes (Signed)
Removed epicardial wires per order. 4 intact.  Pt tolerated procedure well.  Pt instructed to remain on bedrest for one hour.  Frequent vitals will be taken and documented. Pt resting with call bell within reach. Payton Emerald, RN

## 2014-06-06 NOTE — Progress Notes (Signed)
CARDIAC REHAB PHASE I   Pt up with NT.  MODE:  Ambulation: 350 ft   POST:  Rate/Rhythm: 130 ST    BP: sitting 108/80     SaO2: 95 RA  Pt up with RW, NT with him. Joined him and monitored HR which was elevated today. Pt with increased SOB and required several rest stops. Pt sts he has not had morning meds. To EOB, SaO2 95 RA. 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 Hoping for more motivation today. [phone]   Josephina Shih Day CES, ACSM 06/06/2014 10:51 AM

## 2014-06-06 NOTE — Discharge Summary (Addendum)
Physician Discharge Summary       [address]       [place] [address]             [phone]    Patient ID: Alec Kelly MRN: [id number] DOB/AGE: 1966/04/17 48 y.o.  Admit date: 06/02/2014 Discharge date: 06/13/2014  Admission Diagnoses: 1. Bicuspid aortic valve 2. Ascending thoracic aortic aneurysm 3. Morbid obesity 4. Diverticulitis of sigmoid colon (with perforation in April 14') Discharge Diagnoses:  1. Bicuspid aortic valve 2. Ascending thoracic aortic aneurysm 3. Morbid obesity 4. Diverticulitis of sigmoid colon (with perforation in April 14') 5. ABL anemia 6. Probable gout 7. UTI  Procedure (s):  1. Median Sternotomy 2. Right axillary artery cannulation using an 8 mm hemashield graft 3. Extracorporeal circulation  4. Replacement of ascending aorta and proximal arch (Hemi-arch) under deep hypothermic circulatory arrest  5. Bentall procedure using a 25 mm St. Jude valved graft.  6. Insertion of right femoral arterial line by Dr. Cyndia Kelly on 06/02/2014.   History of Presenting Illness: The patient is a 49 year old gentleman with a strong family history of heart disease, a history of obesity, and diverticulitis of the sigmoid colon. He was admitted at the end of March with an episode and had an abdominal CT showing uncomplicated diverticulitis. It also showed extensive calcification of the aortic valve. He was having some symptoms of shortness of breath with exertion, nonexertional chest pain and chronic lower extremity edema and was referred to cardiology. A 2D echo on 5/26 showed a moderately thickened, severely calcified aortic valve that could be bicuspid with a mean gradient of 32 mm Hg and a peak of 52 mm Hg with an AVA of 1 cm2. There was no AI. There was severe LV dysfunction with an EF of 25-30%. Cardiac cath showed moderate pulmonary hypertension with PA pressure of 41/31, PCWP of 27/25 with a mean gradient of 27 and peak of 58. CI was 2.57. There  was no significant coronary disease. The LV was dilated with global hypokinesis, no MR, and dilation of the aortic root and ascending aorta. Dr. Cyndia Kelly evaluated the patient for a Bentall procedure (replacing the aortic root, ascending aorta and proximal arch with grafts to the innominate and left common carotid artery. If this aorta is not replaced he will be at significantly increased risk of aortic dissection and progressive enlargement). A long discussion was had with the patient explaining potential risks, benefits, and complications. He agreed to proceed with surgery. He underwent a Bentall on 06/02/2014.   Brief Hospital Course:  The patient was extubated early the morning of post operative day one. He remained afebrile and hemodynamically stable. He was weaned off of Dopamine and Milrinone drips.Alec Kelly, a line, chest tubes, and foley were removed early in the post operative course. Lopressor was started and titrated accordingly. He was started on Coumadin. His PT and INR were monitored daily. His last INR was up to 3.22. He will NOT be given Coumadin tonight (6/30) but will start on 07/01 at 5 mg. PT and INR will be drawn at [facility] on Thursday 7/02. INR needs to be around 2.5 (he has a mechanical valve).He was volume over loaded and diuresed. He was weaned off the insulin drip. The patient's HGA1C pre op was  5.5. The patient was felt surgically stable for transfer from the ICU to [facility]U for further convalescence on 06/05/2013. He continues to progress with cardiac rehab. He was ambulating on room  air. He has been tolerating a diet and has had a bowel movement. Epicardial pacing wires have been removed. Chest tube sutures will be removed in the office after discharge. Patient developed a painful left knee and ankle. It was felt that this was a flare up of gout, which he has had in the past. He was started on Colchicine. He will take Allopurinol once he gets home. He then developed a  fever to 101.4. UA was negative, but urine cultures showed >100,000 multiple bacterial types. Per Dr. Prescott Kelly, he was placed on Keflex 500 tid for one week. He had no signs of a wound infection.CXR showed atelectasis. He has been afebrile for over 48 hours.  Echo showed a moderate pericardial effusion but no tamponade. After discussion with Dr. Karie Kelly, patient is felt surgically stable for discharge.   Latest Vital Signs: Blood pressure 113/74, pulse 96, temperature 97.8 F (36.6 C), temperature source Oral, resp. rate 18, height 5\' 8"  (1.727 m), weight 269 lb 2.9 oz (122.1 kg), SpO2 95.00%.  Physical Exam: Cardiovascular: RRR, no murmurs, gallops, or rubs.  Pulmonary: Diminished at bases; no rales, wheezes, or rhonchi.  Abdomen: Soft, non tender, bowel sounds present.  Extremities: Mild bilateral lower extremity edema. Tenderness (no warmth)posterior right shoulder.  Wounds: Clean and dry. No erythema or signs of infection.   Discharge Condition:Stable  Recent laboratory studies:  Lab Results  Component Value Date   WBC 12.8* 06/12/2014   HGB 9.9* 06/12/2014   HCT 29.7* 06/12/2014   MCV 90.0 06/12/2014   PLT 426* 06/12/2014   Lab Results  Component Value Date   NA 137 06/06/2014   K 4.4 06/06/2014   CL 94* 06/06/2014   CO2 31 06/06/2014   CREATININE 1.07 06/06/2014   GLUCOSE 111* 06/06/2014      Diagnostic Studies: Dg Chest 2 View COMPARISON: June 06, 2014  FINDINGS:  There is mild left base atelectatic change. Elsewhere, the lungs are  clear. The heart is mildly enlarged with postoperative change at in  the mediastinum, not felt to be changed. Patient is status post  aortic valve replacement. No adenopathy. There are surgical clips in  the right axillary region.  IMPRESSION:  Persistent left base atelectasis. No new opacity. No change in  cardiac silhouette. Mediastinal prominence is stable and probably  due to postoperative change. No apparent pneumothorax.   Electronically Signed  By: Alec Kelly M.D.  On: 06/12/2014 08:07    Ct Angio Chest Aorta W/cm &/or Wo/cm  05/18/2014   CLINICAL DATA:  Dilatation of the aortic root by cardiac catheterization.  EXAM: CT ANGIOGRAPHY CHEST WITH CONTRAST  TECHNIQUE: Multidetector CT imaging of the chest was performed using the standard protocol during bolus administration of intravenous contrast. Multiplanar CT image reconstructions and MIPs were obtained to evaluate the vascular anatomy.  CONTRAST:  155mL OMNIPAQUE IOHEXOL 350 MG/ML SOLN  COMPARISON:  No prior cross-sectional imaging of the thoracic aorta. Comparison made to CT of abdomen dated 04/02/2014.  FINDINGS: The aortic valve and annulus are calcified. The aorta measures approximately 3 cm at the level of the sinuses of Valsalva. Initial tubular segment is not dilated and measures 3.7 cm. There is mild aneurysmal dilatation of the distal ascending aorta up to a diameter of 4.2 cm. The proximal arch is also dilated at 4.2 cm. The mid and distal arch are normal caliber, measuring 2.4 cm. The descending thoracic aorta measures 2.5 cm.  There is no evidence of intramural hemorrhage or aortic dissection.  The proximal great vessels are well visualized and show normal patency and branching anatomy. The pulmonary arteries are of normal caliber. No pleural or pericardial fluid is identified. The heart size is normal. No lung nodules or lymphadenopathy. Nonspecific thickened appearance of the distal esophagus again noted and stable without evidence of significant hiatal hernia. Correlation suggested with any symptoms referable to the esophagus or history of esophageal pathology.  The visualized upper abdominal structures are unremarkable.  Review of the MIP images confirms the above findings.  IMPRESSION: 1. Mild dilatation of the distal ascending thoracic aorta and proximal arch, measuring 4.2 cm in diameter. No associated dissection. There is associated calcification of  the aortic valve and annulus. 2. Persistent thickened appearance of the distal esophagus.   Electronically Signed   By: Aletta Edouard M.D.   On: 05/18/2014 12:29   Discharge Medications:   Medication List         acetaminophen 500 MG tablet  Commonly known as:  TYLENOL  Take 500-2,000 mg by mouth every 6 (six) hours as needed.     allopurinol 100 MG tablet  Commonly known as:  ZYLOPRIM  Take 1 tablet (100 mg total) by mouth daily as needed (for gout).     aspirin 81 MG EC tablet  Take 1 tablet (81 mg total) by mouth daily.     cephALEXin 500 MG capsule  Commonly known as:  KEFLEX  Take 1 capsule (500 mg total) by mouth every 8 (eight) hours. For one week then stop.     metoprolol tartrate 25 MG tablet  Commonly known as:  LOPRESSOR  Take 1 tablet (25 mg total) by mouth 2 (two) times daily.     oxyCODONE 5 MG immediate release tablet  Commonly known as:  Oxy IR/ROXICODONE  Take 1-2 tablets (5-10 mg total) by mouth every 4 (four) hours as needed for severe pain.     warfarin 5 MG tablet  Commonly known as:  COUMADIN  Take 1 tablet (5 mg total) by mouth daily at 6 PM.  Start taking on:  06/14/2014        Follow Up Appointments: Follow-up Information   Follow up with Richardson Dopp, PA-C On 06/20/2014. (Appointment time is at 11:00 am)    Specialty:  Physician Assistant   Contact information:   [place]. [place] [address] [phone]       Follow up with Gaye Pollack, MD On 06/13/2014. (PA/LAT CXR to be taken (at [place] which is in the same building as Dr. Vivi Martens office) on 07/05/2014 at 11:00 am;Appointment time is at 12:00 pm)    Specialty:  Cardiothoracic Surgery   Contact information:   [address] [place] [address] [phone]       Follow up with [facility] On 06/15/2014. (PT and INR drawn (as is on Coumadin) for Thursday 06/15/2014 at 2:00 pm)    Contact information:   [address] [place] [address]       Follow up with Gaye Pollack, MD On 06/15/2014. (Appointment time is at 10:30 am and is with the nurse for chest tube suture removal only)    Specialty:  Cardiothoracic Surgery   Contact information:   [address] [place] [address] [phone]       Signed: Lars Pinks MPA-C 06/13/2014, 8:24 AM

## 2014-06-07 ENCOUNTER — Encounter (HOSPITAL_COMMUNITY): Payer: Self-pay | Admitting: Surgery

## 2014-06-07 LAB — PROTIME-INR
INR: 1.38 (ref 0.00–1.49)
Prothrombin Time: 16.6 seconds — ABNORMAL HIGH (ref 11.6–15.2)

## 2014-06-07 MED ORDER — LACTULOSE 10 GM/15ML PO SOLN
10.0000 g | Freq: Once | ORAL | Status: AC
  Administered 2014-06-07: 10 g via ORAL
  Filled 2014-06-07 (×2): qty 15

## 2014-06-07 NOTE — Progress Notes (Signed)
CARDIAC REHAB PHASE I   PRE:  Rate/Rhythm: 112 ST  BP:  Supine:   Sitting: 106/80  Standing:    SaO2: 95%RA  MODE:  Ambulation: 468 ft   POST:  Rate/Rhythm: 132, 112 after resting  BP:  Supine:   Sitting: 130/80  Standing:    SaO2: 96%RA 0820-0845 Pt walked 468 ft on RA with rolling walker and minimal asst. Stopped a couple of times to catch his breath. Encouraged IS and flutter valve. To recliner after walk. C/o incisional pain and requested pain med. Notified RN. Heart rate up with walk.   Graylon Good, RN BSN  06/07/2014 8:42 AM

## 2014-06-07 NOTE — Progress Notes (Addendum)
      [address]       [place] [address]             [phone]        5 Days Post-Op Procedure(s) (LRB): BENTALL PROCEDURE: 75mm St Jude Valve Conduit  (N/A) Aortic Arch Replacement with 8mm Hemasheild Graft (N/A) INTRAOPERATIVE TRANSESOPHAGEAL ECHOCARDIOGRAM (N/A)  Subjective: Patient states right shoulder pain a little better.  Objective: Vital signs in last 24 hours: Temp:  [98 F (36.7 C)-98.7 F (37.1 C)] 98 F (36.7 C) (06/24 0524) Pulse Rate:  [86-105] 91 (06/24 0524) Cardiac Rhythm:  [-] Normal sinus rhythm (06/23 1930) Resp:  [18] 18 (06/24 0524) BP: (106-117)/(67-81) 106/67 mmHg (06/24 0524) SpO2:  [95 %-98 %] 96 % (06/24 0524) Weight:  [282 lb 11.2 oz (128.232 kg)] 282 lb 11.2 oz (128.232 kg) (06/24 0524)  Pre op weight 127.9 kg Current Weight  06/07/14 282 lb 11.2 oz (128.232 kg)      Intake/Output from previous day: 06/23 0701 - 06/24 0700 In: 840 [P.O.:840] Out: 400 [Urine:400]   Physical Exam:  Cardiovascular: RRR, no murmurs, gallops, or rubs. Sharp valve click. Pulmonary: Diminished at bases; no rales, wheezes, or rhonchi. Abdomen: Soft, non tender, bowel sounds present. Extremities: Trace bilateral lower extremity edema.  Wounds: Clean and dry.  No erythema or signs of infection.  Lab Results: CBC:  Recent Labs  06/05/14 0220 06/06/14 0315  WBC 15.1* 12.7*  HGB 10.3* 9.8*  HCT 32.1* 29.5*  PLT 150 203   BMET:   Recent Labs  06/05/14 0220 06/06/14 0315  NA 137 137  K 4.7 4.4  CL 97 94*  CO2 32 31  GLUCOSE 122* 111*  BUN 19 22  CREATININE 1.06 1.07  CALCIUM 8.7 8.8    PT/INR:  Lab Results  Component Value Date   INR 1.38 06/07/2014   INR 1.29 06/06/2014   INR 1.22 06/05/2014   ABG:  INR: Will add last result for INR, ABG once components are confirmed Will add last 4 CBG results once components are confirmed  Assessment/Plan:  1. CV - SR in the 90's. On Lopressor 25 bid and Coumadin. INR  slightly increased to 1.38. Continue with Coumadin 10 daily. 2.  Pulmonary - CXR appears to show no pneumothorax, some atelectasis at left base, trace bilateral pleural effusions.Encourage incentive spirometer and flutter valve 3. Volume Overload - On Lasix 40 daily 4.  Acute blood loss anemia - H and H 9.8 and 29.5 5. Will be ready for discharge when INR closer to 2 as has mechanical valve  ZIMMERMAN,DONIELLE MPA-C 06/07/2014,7:33 AM    Chart reviewed, patient examined, agree with above. His weight is at baseline if accurate. He really doesn't have any edema. He is puffy at baseline due to obesity.

## 2014-06-08 LAB — PROTIME-INR
INR: 1.64 — AB (ref 0.00–1.49)
Prothrombin Time: 19.4 seconds — ABNORMAL HIGH (ref 11.6–15.2)

## 2014-06-08 LAB — GLUCOSE, CAPILLARY: GLUCOSE-CAPILLARY: 146 mg/dL — AB (ref 70–99)

## 2014-06-08 NOTE — Care Management Note (Signed)
    Page 1 of 2   06/13/2014     3:24:28 PM CARE MANAGEMENT NOTE 06/13/2014  Patient:  Alec Kelly, Alec Kelly   Account Number:  [id number]  Date Initiated:  06/08/2014  Documentation initiated by:  AMERSON,JULIE  Subjective/Objective Assessment:   Pt s/p Bentall procedure and aortic arch repair on 06/02/14. PTA, pt independent, lives with girlfriend.     Action/Plan:   Will follow for dc needs as pt progresses.  Pt has no insurance; may need MATCH at dc; will check eligibility. Pt has no PCP; will discuss PCP options with pt.   Anticipated DC Date:  06/09/2014   Anticipated DC Plan:  [place]  CM consult  Medication Assistance  [facility]  Follow-up appt scheduled      Choice offered to / List presented to:     DME arranged  [facility]      DME agency  [place].        Status of service:  Completed, signed off Medicare Important Message given?   (If response is "NO", the following Medicare IM given date fields will be blank) Date Medicare IM given:   Medicare IM given by:   Date Additional Medicare IM given:   Additional Medicare IM given by:    Discharge Disposition:  HOME/SELF CARE  Per UR Regulation:  Reviewed for med. necessity/level of care/duration of stay  If discussed at [place] of Stay Meetings, dates discussed:   06/13/2014    Comments:  06/13/14 Ellan Lambert, RN, BSN [phone] Pt for dc home today with GF.  Requests RW for home; [facility] consulted for DME needs.  Pt eligible for [facility] program for medication assistance.  [place] letter given with explanation of program benefits.  Pt has no PCP; appt made at [facility] and [facility] for July 14 at 11:30.

## 2014-06-08 NOTE — Progress Notes (Signed)
Pt ambulated 550 feet with rolling walker; pt tolerated ambulation well; no needs voiced; will cont. To monitor.

## 2014-06-08 NOTE — Progress Notes (Signed)
Pt up ambulating in hallway at this time with rolling walker; no needs voiced; will cont. To monitor.

## 2014-06-08 NOTE — Progress Notes (Addendum)
      [address]       [place] [address]             [phone]        6 Days Post-Op Procedure(s) (LRB): BENTALL PROCEDURE: 15mm St Jude Valve Conduit  (N/A) Aortic Arch Replacement with 54mm Hemasheild Graft (N/A) INTRAOPERATIVE TRANSESOPHAGEAL ECHOCARDIOGRAM (N/A)  Subjective: Patient just waking up this am  Objective: Vital signs in last 24 hours: Temp:  [98 F (36.7 C)-99.2 F (37.3 C)] 99.2 F (37.3 C) (06/25 0359) Pulse Rate:  [92-104] 101 (06/25 0359) Cardiac Rhythm:  [-] Normal sinus rhythm (06/24 2029) Resp:  [18] 18 (06/25 0359) BP: (110-119)/(60-74) 119/72 mmHg (06/25 0359) SpO2:  [96 %-98 %] 98 % (06/25 0359) Weight:  [279 lb 9.6 oz (126.826 kg)] 279 lb 9.6 oz (126.826 kg) (06/25 0359)  Pre op weight 127.9 kg Current Weight  06/08/14 279 lb 9.6 oz (126.826 kg)      Intake/Output from previous day: 06/24 0701 - 06/25 0700 In: 480 [P.O.:480] Out: 201 [Urine:200; Stool:1]   Physical Exam:  Cardiovascular: RRR, no murmurs, gallops, or rubs. Sharp valve click. Pulmonary: Mostly clear; no rales, wheezes, or rhonchi. Abdomen: Soft, non tender, bowel sounds present. Extremities: No lower extremity edema.  Wounds: Clean and dry.  No erythema or signs of infection.  Lab Results: CBC:  Recent Labs  06/06/14 0315  WBC 12.7*  HGB 9.8*  HCT 29.5*  PLT 203   BMET:   Recent Labs  06/06/14 0315  NA 137  K 4.4  CL 94*  CO2 31  GLUCOSE 111*  BUN 22  CREATININE 1.07  CALCIUM 8.8    PT/INR:  Lab Results  Component Value Date   INR 1.64* 06/08/2014   INR 1.38 06/07/2014   INR 1.29 06/06/2014   ABG:  INR: Will add last result for INR, ABG once components are confirmed Will add last 4 CBG results once components are confirmed  Assessment/Plan:  1. CV - SR in the 90's. On Lopressor 25 bid and Coumadin. INR  increased to 1.64. Continue with Coumadin 10 daily. 2.  Pulmonary - Encourage incentive spirometer and flutter  valve 3.  Acute blood loss anemia - H and H 9.8 and 29.5 4. Will be ready for discharge when INR closer to 2 as has mechanical valve;hopefully, in am  ZIMMERMAN,DONIELLE MPA-C 06/08/2014,7:30 AM     Chart reviewed, patient examined, agree with above. He looks good. Check INR in the am.

## 2014-06-08 NOTE — Progress Notes (Signed)
CARDIAC REHAB PHASE I   PRE:  Rate/Rhythm: 108 ST BBB  BP:  Supine:   Sitting: 120/70  Standing:    SaO2: 94 RA  MODE:  Ambulation: 510 ft   POST:  Rate/Rhythm: 128  BP:  Supine:   Sitting: 108/70  Standing:    SaO2: 96 RA 1030-1100 Assisted X 1 and used walker to ambulate. Gait steady with walker. I attempted to get him to not use walker but he insisted. I was able to get pt 510 feet with encouragement. On return to room assisted pt to bathroom. He ask me to stay in room and wait fro him to finish. I encouraged pt to use call light in bathroom if he needed assistance. He is moving very well on his own.  Rodney Langton RN 06/08/2014 11:02 AM

## 2014-06-08 NOTE — Progress Notes (Signed)
Pt encouraged to ambulate tonight prior to going to bed; pt stated he would ambulate; will cont. To monitor.

## 2014-06-09 LAB — PULMONARY FUNCTION TEST
DL/VA % PRED: 160 %
DL/VA: 7.28 ml/min/mmHg/L
DLCO COR: 30.22 ml/min/mmHg
DLCO cor % pred: 101 %
DLCO unc % pred: 101 %
DLCO unc: 30.22 ml/min/mmHg
FEF 25-75 POST: 2.57 L/s
FEF 25-75 Pre: 2.56 L/sec
FEF2575-%CHANGE-POST: 0 %
FEF2575-%PRED-POST: 75 %
FEF2575-%Pred-Pre: 75 %
FEV1-%Change-Post: 0 %
FEV1-%PRED-POST: 74 %
FEV1-%PRED-PRE: 74 %
FEV1-PRE: 2.78 L
FEV1-Post: 2.79 L
FEV1FVC-%Change-Post: 0 %
FEV1FVC-%PRED-PRE: 100 %
FEV6-%Change-Post: 0 %
FEV6-%Pred-Post: 76 %
FEV6-%Pred-Pre: 76 %
FEV6-Post: 3.52 L
FEV6-Pre: 3.54 L
FEV6FVC-%Change-Post: 0 %
FEV6FVC-%PRED-PRE: 103 %
FEV6FVC-%Pred-Post: 102 %
FVC-%CHANGE-POST: 0 %
FVC-%Pred-Post: 73 %
FVC-%Pred-Pre: 74 %
FVC-Post: 3.53 L
FVC-Pre: 3.54 L
PRE FEV1/FVC RATIO: 78 %
Post FEV1/FVC ratio: 79 %
Post FEV6/FVC ratio: 100 %
Pre FEV6/FVC Ratio: 100 %

## 2014-06-09 LAB — PROTIME-INR
INR: 2.02 — ABNORMAL HIGH (ref 0.00–1.49)
Prothrombin Time: 22.9 seconds — ABNORMAL HIGH (ref 11.6–15.2)

## 2014-06-09 MED ORDER — WARFARIN SODIUM 5 MG PO TABS
5.0000 mg | ORAL_TABLET | Freq: Every day | ORAL | Status: DC
  Filled 2014-06-09: qty 1

## 2014-06-09 MED ORDER — COLCHICINE 0.6 MG PO TABS
0.6000 mg | ORAL_TABLET | Freq: Two times a day (BID) | ORAL | Status: DC
  Administered 2014-06-09 – 2014-06-10 (×3): 0.6 mg via ORAL
  Filled 2014-06-09 (×4): qty 1

## 2014-06-09 MED ORDER — WARFARIN SODIUM 7.5 MG PO TABS: 7.5000 mg | ORAL_TABLET | Freq: Every day | ORAL | Status: DC

## 2014-06-09 MED ORDER — METOPROLOL TARTRATE 25 MG PO TABS: 25.0000 mg | ORAL_TABLET | Freq: Two times a day (BID) | ORAL | Status: DC

## 2014-06-09 MED ORDER — WARFARIN SODIUM 7.5 MG PO TABS
7.5000 mg | ORAL_TABLET | Freq: Every day | ORAL | Status: DC
  Administered 2014-06-09 – 2014-06-10 (×2): 7.5 mg via ORAL
  Filled 2014-06-09 (×3): qty 1

## 2014-06-09 MED ORDER — ASPIRIN 81 MG PO TBEC: 81.0000 mg | DELAYED_RELEASE_TABLET | Freq: Every day | ORAL | Status: AC

## 2014-06-09 MED ORDER — OXYCODONE HCL 5 MG PO TABS: 5.0000 mg | ORAL_TABLET | ORAL | Status: DC | PRN

## 2014-06-09 NOTE — Progress Notes (Signed)
CARDIAC REHAB PHASE I   PRE:  Rate/Rhythm: 105 ST BBB  BP:  Supine:   Sitting: 102/80  Standing:    SaO2: 94%RA  MODE:  Ambulation: 40 ft   POST:  Rate/Rhythm: 109 ST BBB  BP:  Supine:   Sitting: 122/70  Standing:    SaO2: 98%RA 1105-1130 Told pt Dr.Bartle wanted him to walk. Pt stated he would try. Pt walked 40 ft on RA with rolling walker and asst x 1. C/o left foot and left knee very painful with gout. Barely able to put left foot down. Assisted to recliner and made pt comfortable. Told him when he is going to be discharged that we need his care giver here so that they can hear education also. Pt stated that was fine. Pt will need encouragement to maximize mobility at home to recover.   Graylon Good, RN BSN  06/09/2014 11:27 AM

## 2014-06-09 NOTE — Progress Notes (Addendum)
      [address]       [place] [address]             [phone]        7 Days Post-Op Procedure(s) (LRB): BENTALL PROCEDURE: 13mm St Jude Valve Conduit  (N/A) Aortic Arch Replacement with 18mm Hemasheild Graft (N/A) INTRAOPERATIVE TRANSESOPHAGEAL ECHOCARDIOGRAM (N/A)  Subjective: Patient with pain in left ankle. He denies any trauma and thinks it is a "gout flare up" as he has had this in the past.  Objective: Vital signs in last 24 hours: Temp:  [97.7 F (36.5 C)-98.6 F (37 C)] 98.6 F (37 C) (06/26 0414) Pulse Rate:  [87-107] 87 (06/26 0414) Cardiac Rhythm:  [-] Normal sinus rhythm (06/25 2119) Resp:  [18] 18 (06/26 0414) BP: (103-137)/(58-87) 105/58 mmHg (06/26 0414) SpO2:  [96 %-99 %] 96 % (06/26 0414)  Pre op weight 127.9 kg Current Weight  06/08/14 279 lb 9.6 oz (126.826 kg)      Intake/Output from previous day: 06/25 0701 - 06/26 0700 In: 840 [P.O.:840] Out: 450 [Urine:450]   Physical Exam:  Cardiovascular: RRR, no murmurs, gallops, or rubs. Sharp valve click. Pulmonary: Mostly clear; no rales, wheezes, or rhonchi. Abdomen: Soft, non tender, bowel sounds present. Extremities: No lower extremity edema. Some tenderness to palpation left ankle. No redness. Wounds: Clean and dry.  No erythema or signs of infection.  Lab Results: CBC: No results found for this basename: WBC, HGB, HCT, PLT,  in the last 72 hours BMET:  No results found for this basename: NA, K, CL, CO2, GLUCOSE, BUN, CREATININE, CALCIUM,  in the last 72 hours  PT/INR:  Lab Results  Component Value Date   INR 2.02* 06/09/2014   INR 1.64* 06/08/2014   INR 1.38 06/07/2014   ABG:  INR: Will add last result for INR, ABG once components are confirmed Will add last 4 CBG results once components are confirmed  Assessment/Plan:  1. CV - SR in the 90's. On Lopressor 25 bid and Coumadin. INR  increased to 2.02. Has been on Coumadin 10 daily the last 3 days. Likely will  need  7.5 at discharge 2.  Pulmonary - Encourage incentive spirometer and flutter valve 3.  Acute blood loss anemia - H and H 9.8 and 29.5 4. Possible gout left ankle-give Colchicine. Takes Allopurinol as needed at home so will continue this at discharge. 5. Discharge in am  ZIMMERMAN,DONIELLE MPA-C 06/09/2014,7:36 AM      Chart reviewed, patient examined, agree with above. His INR is therapeutic but he says his gout flared up overnight and he can't walk this am. Colchicine started. He can go home as soon as gout improved so he can ambulate.

## 2014-06-10 LAB — PROTIME-INR
INR: 2.24 — ABNORMAL HIGH (ref 0.00–1.49)
Prothrombin Time: 24.8 seconds — ABNORMAL HIGH (ref 11.6–15.2)

## 2014-06-10 MED ORDER — COLCHICINE 0.6 MG PO TABS
1.2000 mg | ORAL_TABLET | Freq: Once | ORAL | Status: AC
  Administered 2014-06-10: 1.2 mg via ORAL
  Filled 2014-06-10: qty 2

## 2014-06-10 MED ORDER — COLCHICINE 0.6 MG PO TABS
0.6000 mg | ORAL_TABLET | Freq: Once | ORAL | Status: AC | PRN
  Filled 2014-06-10: qty 1

## 2014-06-10 MED ORDER — COLCHICINE 0.6 MG PO TABS: 0.6000 mg | ORAL_TABLET | ORAL | Status: DC

## 2014-06-10 MED ORDER — COLCHICINE 0.6 MG PO TABS
0.6000 mg | ORAL_TABLET | Freq: Two times a day (BID) | ORAL | Status: DC
  Administered 2014-06-11 – 2014-06-13 (×5): 0.6 mg via ORAL
  Filled 2014-06-10 (×6): qty 1

## 2014-06-10 MED ORDER — COLCHICINE 0.6 MG PO TABS: 0.6000 mg | ORAL_TABLET | Freq: Four times a day (QID) | ORAL | Status: DC

## 2014-06-10 NOTE — Progress Notes (Signed)
Patient spoke with Probation officer re:  his colchicine medication.  He states he takes it at least 3 - 4 times a day.  Patient educated as to the fact that colchicine is not a pain medication, but rather a preventative measure for gouty pain.  Patient states he had been trying to use it for pain management at home and wondered why it wasn't effective.  Cautioned re:  Side effects of this medication, especially at the dosage he had been using.

## 2014-06-10 NOTE — Progress Notes (Addendum)
      [address]       [place] [address]             [phone]      8 Days Post-Op Procedure(s) (LRB): BENTALL PROCEDURE: 28mm St Jude Valve Conduit  (N/A) Aortic Arch Replacement with 56mm Hemasheild Graft (N/A) INTRAOPERATIVE TRANSESOPHAGEAL ECHOCARDIOGRAM (N/A)  Subjective:  Alec Kelly continues to complain of left foot and knee pain. States he is still unable to walk even to the door.  Objective: Vital signs in last 24 hours: Temp:  [98.3 F (36.8 C)-99.4 F (37.4 C)] 98.3 F (36.8 C) (06/27 0451) Pulse Rate:  [89-104] 93 (06/27 0451) Cardiac Rhythm:  [-] Normal sinus rhythm (06/26 2031) Resp:  [18] 18 (06/27 0451) BP: (105-115)/(60-72) 113/72 mmHg (06/27 0451) SpO2:  [95 %-97 %] 96 % (06/27 0451) Weight:  [274 lb 4 oz (124.4 kg)] 274 lb 4 oz (124.4 kg) (06/27 0451)  Intake/Output from previous day: 06/26 0701 - 06/27 0700 In: 240 [P.O.:240] Out: -   General appearance: alert, cooperative and no distress Heart: regular rate and rhythm Lungs: clear to auscultation bilaterally Abdomen: soft, non-tender; bowel sounds normal; no masses,  no organomegaly Extremities: edema trace, left foot minimal edema, no erythema present Wound: clean and dry  Lab Results: No results found for this basename: WBC, HGB, HCT, PLT,  in the last 72 hours BMET: No results found for this basename: NA, K, CL, CO2, GLUCOSE, BUN, CREATININE, CALCIUM,  in the last 72 hours  PT/INR:  Recent Labs  06/10/14 0337  LABPROT 24.8*  INR 2.24*   ABG    Component Value Date/Time   PHART 7.338* 06/03/2014 0417   HCO3 22.0 06/03/2014 0417   TCO2 25 06/03/2014 1752   ACIDBASEDEF 4.0* 06/03/2014 0417   O2SAT 91.0 06/03/2014 0417   CBG (last 3)  No results found for this basename: GLUCAP,  in the last 72 hours  Assessment/Plan: S/P Procedure(s) (LRB): BENTALL PROCEDURE: 57mm St Jude Valve Conduit  (N/A) Aortic Arch Replacement with 2mm Hemasheild Graft  (N/A) INTRAOPERATIVE TRANSESOPHAGEAL ECHOCARDIOGRAM (N/A)  1. CV- remains hemodynamically stable- continue Lopressor 2. INR 2.24- will continue Coumadin at 7.5 mg 3. Pulm- no acute issues 4. Left foot/ankle pain- possible Gout flare on Colchcine 5. Dispo- is medically stable for discharge, will d/c once able to ambulate  LOS: 8 days    Alec Kelly 06/10/2014  The patient states colchicine worked very well for his gout in the past but he will need more than a twice a day dosing. Will increase colchicine dosing and continue to follow INR. He is unable to walk today because of left foot gout  patient examined and medical record reviewed,agree with above note. Alec Kelly,Alec Kelly 06/10/2014

## 2014-06-10 NOTE — Progress Notes (Signed)
Pt has temperature of 101.4. Gave 650mg  of tylenol. Will reassess. Pt refused to get out of the bed today.  Educated and encouraged pt to turn/cough/deep breathe and use the I/S and Flutter. Pt refused to demonstrate at the time. Monitoring closely

## 2014-06-10 NOTE — Progress Notes (Signed)
Nutrition Brief Note  Patient identified on the Malnutrition Screening Tool (MST) Report  Wt Readings from Last 15 Encounters:  06/10/14 274 lb 4 oz (124.4 kg)  06/10/14 274 lb 4 oz (124.4 kg)  05/31/14 282 lb 3 oz (128 kg)  05/18/14 279 lb (126.554 kg)  05/17/14 299 lb (135.626 kg)  05/15/14 279 lb (126.554 kg)  05/15/14 279 lb (126.554 kg)  04/28/14 279 lb 6.4 oz (126.735 kg)  04/10/14 279 lb 12.8 oz (126.916 kg)  03/15/14 278 lb 14.1 oz (126.5 kg)  10/08/13 270 lb (122.471 kg)  05/20/13 254 lb (115.214 kg)  05/13/13 251 lb (113.853 kg)  04/28/13 249 lb (112.946 kg)  04/18/13 242 lb (109.77 kg)    Body mass index is 41.71 kg/(m^2). Patient meets criteria for Obesity III based on current BMI.   Current diet order is Heart healthy/Carb Modified, patient is consuming approximately 50-100% of meals at this time. Labs and medications reviewed.   Patient denied any changes in appetite or weight pta. Appetite slightly decreased since admit d/t gout and dislike of certain hospital foods. Reviewed gout nutrition therapy per pt and pt family request; they were familiar with recommendations as pt is able to manage gout by diet changes at home. Current heart healthy diet likely not contributing to gout flare.  No nutrition interventions warranted at this time. If nutrition issues arise, please consult RD.   Atlee Abide MS RD LDN Clinical Dietitian [phone]

## 2014-06-11 LAB — URINALYSIS, ROUTINE W REFLEX MICROSCOPIC
Glucose, UA: NEGATIVE mg/dL
Ketones, ur: NEGATIVE mg/dL
LEUKOCYTES UA: NEGATIVE
NITRITE: NEGATIVE
PROTEIN: NEGATIVE mg/dL
Specific Gravity, Urine: 1.026 (ref 1.005–1.030)
UROBILINOGEN UA: 1 mg/dL (ref 0.0–1.0)
pH: 5 (ref 5.0–8.0)

## 2014-06-11 LAB — PROTIME-INR
INR: 2.4 — AB (ref 0.00–1.49)
Prothrombin Time: 26.2 seconds — ABNORMAL HIGH (ref 11.6–15.2)

## 2014-06-11 LAB — URINE MICROSCOPIC-ADD ON

## 2014-06-11 MED ORDER — WARFARIN SODIUM 5 MG PO TABS
5.0000 mg | ORAL_TABLET | Freq: Every day | ORAL | Status: DC
  Administered 2014-06-11: 5 mg via ORAL
  Filled 2014-06-11 (×2): qty 1

## 2014-06-11 MED ORDER — ALLOPURINOL 100 MG PO TABS
100.0000 mg | ORAL_TABLET | Freq: Every day | ORAL | Status: DC
  Administered 2014-06-12 – 2014-06-13 (×2): 100 mg via ORAL
  Filled 2014-06-11 (×2): qty 1

## 2014-06-11 NOTE — Progress Notes (Addendum)
      [address]       [place] [address]             [phone]      9 Days Post-Op Procedure(s) (LRB): BENTALL PROCEDURE: 57mm St Jude Valve Conduit  (N/A) Aortic Arch Replacement with 16mm Hemasheild Graft (N/A) INTRAOPERATIVE TRANSESOPHAGEAL ECHOCARDIOGRAM (N/A)  Subjective:  Mr. Statzer states he feels like crap today.  He has no specific complaints.  His states his left foot pain has improved.  He did have a fever of 101.4 overnight, and per nursing documentation, patient refused to get out of bed yesterday.  Patient instructed on the importance of getting up and trying to ambulate  Objective: Vital signs in last 24 hours: Temp:  [98.1 F (36.7 C)-101.4 F (38.6 C)] 98.1 F (36.7 C) (06/28 0427) Pulse Rate:  [84-118] 106 (06/28 0427) Cardiac Rhythm:  [-] Sinus tachycardia (06/27 2230) Resp:  [18-21] 20 (06/28 0427) BP: (100-115)/(65-75) 113/75 mmHg (06/28 0427) SpO2:  [92 %-96 %] 96 % (06/28 0427) Weight:  [269 lb 14.4 oz (122.426 kg)] 269 lb 14.4 oz (122.426 kg) (06/28 0427)  Intake/Output from previous day: 06/27 0701 - 06/28 0700 In: 360 [P.O.:360] Out: 500 [Urine:500]  General appearance: alert, cooperative and no distress Heart: regular rate and rhythm Lungs: clear to auscultation bilaterally Abdomen: soft, non-tender; bowel sounds normal; no masses,  no organomegaly Extremities: edema trace Wound: clean and dry  Lab Results: No results found for this basename: WBC, HGB, HCT, PLT,  in the last 72 hours BMET: No results found for this basename: NA, K, CL, CO2, GLUCOSE, BUN, CREATININE, CALCIUM,  in the last 72 hours  PT/INR:  Recent Labs  06/11/14 0458  LABPROT 26.2*  INR 2.40*   ABG    Component Value Date/Time   PHART 7.338* 06/03/2014 0417   HCO3 22.0 06/03/2014 0417   TCO2 25 06/03/2014 1752   ACIDBASEDEF 4.0* 06/03/2014 0417   O2SAT 91.0 06/03/2014 0417   CBG (last 3)  No results found for this basename: GLUCAP,  in the last  72 hours  Assessment/Plan: S/P Procedure(s) (LRB): BENTALL PROCEDURE: 71mm St Jude Valve Conduit  (N/A) Aortic Arch Replacement with 80mm Hemasheild Graft (N/A) INTRAOPERATIVE TRANSESOPHAGEAL ECHOCARDIOGRAM (N/A)  1. CV- remains hemodynamically stable- continue Lopressor 2. INR 2.40, continue Coumadin at 7.5 mg 3. Pulm- no acute issues, encouraged use of IS 4. ID- fever of 101.4 last night, no acute signs of infection, will check CBC for AM, send UA 5. Left foot/ankle pain- believed to be gout flare, continue Colchicine 6. Dispo- patient not feeling well today, monitor temp, blood work ordered, patient needs to get out of bed and ambulate    LOS: 9 days    Ahmed Prima, ERIN 06/11/2014  Patient  has been very focused on his left foot pain which is now improved however he is not motivated to ambulate at this time. He states he'll ambulate after lunch Follow INR with the patient on both Coumadin and colchicine-Coumadin decreased to 5 mg Temperature 101 last night-incisions look fine -- UA and culture pending, blood culture ordered when necessary temperature greater than 101 Heart rate has been increasing, currently on Lopressor 25 twice a day Check echocardiogram tomorrow for postop effusion   patient examined and medical record reviewed,agree with above note. VAN TRIGT III,PETER 06/11/2014

## 2014-06-11 NOTE — Progress Notes (Signed)
PT WALKED WITH FRIEND IN HALL WITH ROLLING WALKER 200 FT. Heart rate 130's while walking. Once pt sits down HR in 115-125 ST. Will continue to assess. Lopressor due at bedtime.

## 2014-06-11 NOTE — Progress Notes (Signed)
Pt encouraged to reposition and use I/S t/o the night while awake. ST on monitor.  Pt assisted to bathroom and to chair with walker this am. Tolerated fair. Pt does complain about legs swelling. RN encouraged pt to move more. Pt declined pain med at this time. Use of I/S was demonstrated.Bethanie Dicker and phones in reach. Legs elevated.

## 2014-06-12 ENCOUNTER — Encounter: Payer: Self-pay | Admitting: Cardiology

## 2014-06-12 ENCOUNTER — Inpatient Hospital Stay (HOSPITAL_COMMUNITY): Payer: Self-pay

## 2014-06-12 DIAGNOSIS — I319 Disease of pericardium, unspecified: Secondary | ICD-10-CM

## 2014-06-12 LAB — CBC
HCT: 29.7 % — ABNORMAL LOW (ref 39.0–52.0)
Hemoglobin: 9.9 g/dL — ABNORMAL LOW (ref 13.0–17.0)
MCH: 30 pg (ref 26.0–34.0)
MCHC: 33.3 g/dL (ref 30.0–36.0)
MCV: 90 fL (ref 78.0–100.0)
PLATELETS: 426 10*3/uL — AB (ref 150–400)
RBC: 3.3 MIL/uL — ABNORMAL LOW (ref 4.22–5.81)
RDW: 13.5 % (ref 11.5–15.5)
WBC: 12.8 10*3/uL — AB (ref 4.0–10.5)

## 2014-06-12 LAB — PROTIME-INR
INR: 3.06 — AB (ref 0.00–1.49)
PROTHROMBIN TIME: 31.6 s — AB (ref 11.6–15.2)

## 2014-06-12 MED ORDER — WARFARIN SODIUM 2.5 MG PO TABS
2.5000 mg | ORAL_TABLET | Freq: Once | ORAL | Status: DC
  Filled 2014-06-12: qty 1

## 2014-06-12 MED ORDER — WARFARIN SODIUM 1 MG PO TABS
1.0000 mg | ORAL_TABLET | Freq: Once | ORAL | Status: AC
  Administered 2014-06-12: 1 mg via ORAL
  Filled 2014-06-12: qty 1

## 2014-06-12 NOTE — Progress Notes (Signed)
06/12/2014 5:41 PM Nursing note Pt. Ambulated 550 ft with RW, NT and on RA. Pt. Tolerated well.  Flippin, Arville Lime

## 2014-06-12 NOTE — Progress Notes (Signed)
CARDIAC REHAB PHASE I   PRE:  Rate/Rhythm: 98 SR BBB  BP:  Supine:  Sitting: 100/80  Standing:    SaO2: 99 RA  MODE:  Ambulation: 750 ft   POST:  Rate/Rhythm: 111  BP:  Supine:   Sitting: To bathroom  Standing:    SaO2: 100 RA 1310-1330 Assisted X 1 and used walker to ambulate. Gait steady with walker, pt limps on left knee states it is sore. He was able to walk 750 feet.VS stable Pt to bathroom after walk. I encouraged use of IS and flutter value. He was able to get IS up to 1500.  Rodney Langton RN 06/12/2014 1:32 PM

## 2014-06-12 NOTE — Progress Notes (Signed)
06/12/2014 4:58 PM Nursing note 2d echo report called to Dr. Prescott Gum. No new orders received at this time. Will continue to monitor patient. Flippin, Arville Lime

## 2014-06-12 NOTE — Progress Notes (Addendum)
      [address]       [place] [address]             [phone]        10 Days Post-Op Procedure(s) (LRB): BENTALL PROCEDURE: 5mm St Jude Valve Conduit  (N/A) Aortic Arch Replacement with 47mm Hemasheild Graft (N/A) INTRAOPERATIVE TRANSESOPHAGEAL ECHOCARDIOGRAM (N/A)  Subjective: Patient states pain in left knee/ankle better. He is ambulating this am.  Objective: Vital signs in last 24 hours: Temp:  [98.2 F (36.8 C)-98.9 F (37.2 C)] 98.2 F (36.8 C) (06/29 0355) Pulse Rate:  [96-125] 100 (06/29 0355) Cardiac Rhythm:  [-] Normal sinus rhythm (06/29 0355) Resp:  [18-20] 18 (06/29 0355) BP: (105-124)/(62-86) 105/62 mmHg (06/29 0355) SpO2:  [95 %-99 %] 96 % (06/29 0355)  Pre op weight 127.9 kg Current Weight  06/11/14 269 lb 14.4 oz (122.426 kg)      Intake/Output from previous day: 06/28 0701 - 06/29 0700 In: 480 [P.O.:480] Out: 300 [Urine:300]   Physical Exam:  Cardiovascular: RRR, no murmurs, gallops, or rubs. Sharp valve click. Pulmonary: Mostly clear; no rales, wheezes, or rhonchi. Abdomen: Soft, non tender, bowel sounds present. Extremities: No lower extremity edema.  Wounds: Clean and dry.  No erythema or signs of infection.  Lab Results: CBC:  Recent Labs  06/12/14 0030  WBC 12.8*  HGB 9.9*  HCT 29.7*  PLT 426*   BMET:  No results found for this basename: NA, K, CL, CO2, GLUCOSE, BUN, CREATININE, CALCIUM,  in the last 72 hours  PT/INR:  Lab Results  Component Value Date   INR 3.06* 06/12/2014   INR 2.40* 06/11/2014   INR 2.24* 06/10/2014   ABG:  INR: Will add last result for INR, ABG once components are confirmed Will add last 4 CBG results once components are confirmed  Assessment/Plan:  1. CV - ST in the 100's. On Lopressor 25 bid and Coumadin. INR  increased from 2.4 to 3.06. Will give low dose tonight. Await echo. 2.  Pulmonary - CXR appears to show persistent left base atelectasis, no pneumothorax, and  cardiomegaly.Encourage incentive spirometer and flutter valve 3.  Acute blood loss anemia - H and H 9.9 and 29.7 4. Possible gout left ankle-continue Colchicine. 5. Fever to 101.4 yesterday. Afebrile last 36 hours.WBC remaining 12.8. UA negative for leukocytes. UC is pending. Fever likely related to atelectasis  6. Increase mobility 7. If echo results "ok", possible discharge in am  ZIMMERMAN,DONIELLE MPA-C 06/12/2014,8:07 AM  Patient safe for DC in am if echo w/o sig pericardial effusion and fever resolves patient examined and medical record reviewed,agree with above note. VAN TRIGT III,PETER 06/12/2014

## 2014-06-12 NOTE — Progress Notes (Signed)
  Echocardiogram 2D Echocardiogram has been performed.  Diamond Nickel 06/12/2014, 12:47 PM

## 2014-06-12 NOTE — Progress Notes (Signed)
Pt down to xray via wheelchair. When he returned pt walked in the hall 300 ft with front wheel walker without distress. Pt tolerated well and encouraged to ambulate several more times today. Pt to chair in room with call bell in reach.

## 2014-06-13 LAB — URINE CULTURE: Special Requests: NORMAL

## 2014-06-13 LAB — PROTIME-INR
INR: 3.22 — ABNORMAL HIGH (ref 0.00–1.49)
Prothrombin Time: 32.9 seconds — ABNORMAL HIGH (ref 11.6–15.2)

## 2014-06-13 MED ORDER — CEPHALEXIN 500 MG PO CAPS: 500.0000 mg | ORAL_CAPSULE | Freq: Three times a day (TID) | ORAL | Status: DC

## 2014-06-13 MED ORDER — CEPHALEXIN 500 MG PO CAPS
500.0000 mg | ORAL_CAPSULE | Freq: Three times a day (TID) | ORAL | Status: DC
Start: 2014-06-13 — End: 2014-06-13
  Administered 2014-06-13: 500 mg via ORAL
  Filled 2014-06-13 (×4): qty 1

## 2014-06-13 MED ORDER — WARFARIN SODIUM 5 MG PO TABS: 5.0000 mg | ORAL_TABLET | Freq: Every day | ORAL | Status: DC

## 2014-06-13 MED ORDER — ALLOPURINOL 100 MG PO TABS: 100.0000 mg | ORAL_TABLET | Freq: Every day | ORAL | Status: DC | PRN

## 2014-06-13 NOTE — Progress Notes (Signed)
06/13/2014 1210 Nursing note Discharge avs form, medications already taken today and those due this evening given and explained to patient and family member. rx given to patient. Follow up appointments, incision site care, activity restrictions and when to call MD reviewed. INR checks reviewed with patient along with specific coumadin instructions from MD. CTS left in place per orders.  No iv access found. D/c tele. D/c home with family and rolling walker per orders.  Alec Kelly, Alec Kelly

## 2014-06-13 NOTE — Progress Notes (Signed)
[id number] Education completed with pt and care giver who both voiced understanding. Gave low sodium diets and encouraged heart healthy eating. Reminded pt that he needs to watch dark green leafy vegetables with Coumadin. Walker delivered while I was educating pt. Exercise guidelines given. Encouraged IS and flutter valve. Graylon Good RN BSN 06/13/2014 11:54 AM

## 2014-06-13 NOTE — Progress Notes (Signed)
      [address]       [place] [address]             [phone]        11 Days Post-Op Procedure(s) (LRB): BENTALL PROCEDURE: 94mm St Jude Valve Conduit  (N/A) Aortic Arch Replacement with 35mm Hemasheild Graft (N/A) INTRAOPERATIVE TRANSESOPHAGEAL ECHOCARDIOGRAM (N/A)  Subjective: Patient ready to go home.  Objective: Vital signs in last 24 hours: Temp:  [97.8 F (36.6 C)-98.4 F (36.9 C)] 97.8 F (36.6 C) (06/30 0500) Pulse Rate:  [93-96] 96 (06/30 0500) Cardiac Rhythm:  [-] Normal sinus rhythm (06/29 2154) Resp:  [18-20] 18 (06/30 0500) BP: (97-114)/(66-74) 113/74 mmHg (06/30 0500) SpO2:  [95 %-97 %] 95 % (06/30 0500) Weight:  [269 lb 2.9 oz (122.1 kg)] 269 lb 2.9 oz (122.1 kg) (06/30 0500)  Pre op weight 127.9 kg Current Weight  06/13/14 269 lb 2.9 oz (122.1 kg)      Intake/Output from previous day: 06/29 0701 - 06/30 0700 In: 3 [I.V.:3] Out: -    Physical Exam:  Cardiovascular: RRR, no murmurs, gallops, or rubs. Sharp valve click. Pulmonary: Mostly clear; no rales, wheezes, or rhonchi. Abdomen: Soft, non tender, bowel sounds present. Extremities: No lower extremity edema.  Wounds: Clean and dry.  No erythema or signs of infection.  Lab Results: CBC:  Recent Labs  06/12/14 0030  WBC 12.8*  HGB 9.9*  HCT 29.7*  PLT 426*   BMET:  No results found for this basename: NA, K, CL, CO2, GLUCOSE, BUN, CREATININE, CALCIUM,  in the last 72 hours  PT/INR:  Lab Results  Component Value Date   INR 3.22* 06/13/2014   INR 3.06* 06/12/2014   INR 2.40* 06/11/2014   ABG:  INR: Will add last result for INR, ABG once components are confirmed Will add last 4 CBG results once components are confirmed  Assessment/Plan:  1. CV - ST in the 100's. On Lopressor 25 bid and Coumadin. INR  increased from 3.06 to 3.22. Hold Coumadin tonight. Will discharge on Coumadin 5. INR to be around 2.5. Echo showed a moderate pericardial effusion. 2.   Pulmonary - Encourage incentive spirometer and flutter valve 3.  Acute blood loss anemia - H and H 9.9 and 29.7 4. Possible gout left ankle-on Colchicine. He took Allopurinol at home so will resume at discharge 5. Fever to 101.4 Sunday. Afebrile last 48 hours.WBC remaining 12.8. UA negative for leukocytes. UC showed >100,000 multiple bacterial types. Per Dr. Prescott Gum, Keflex 500 tid for one week. 6.As discussed with Dr. Prescott Gum, discharge   ZIMMERMAN,DONIELLE MPA-C 06/13/2014,8:05 AM

## 2014-06-15 ENCOUNTER — Ambulatory Visit (INDEPENDENT_AMBULATORY_CARE_PROVIDER_SITE_OTHER): Payer: Self-pay

## 2014-06-15 ENCOUNTER — Ambulatory Visit (INDEPENDENT_AMBULATORY_CARE_PROVIDER_SITE_OTHER): Payer: Self-pay | Admitting: Pharmacist

## 2014-06-15 DIAGNOSIS — Z4802 Encounter for removal of sutures: Secondary | ICD-10-CM

## 2014-06-15 DIAGNOSIS — I359 Nonrheumatic aortic valve disorder, unspecified: Secondary | ICD-10-CM

## 2014-06-15 DIAGNOSIS — Z954 Presence of other heart-valve replacement: Secondary | ICD-10-CM

## 2014-06-15 DIAGNOSIS — I2510 Atherosclerotic heart disease of native coronary artery without angina pectoris: Secondary | ICD-10-CM

## 2014-06-15 LAB — POCT INR: INR: 2.4

## 2014-06-15 NOTE — Progress Notes (Signed)
Removed 2 sutures from chest tube sites. No signs of infection and patient tolerated well. 

## 2014-06-20 ENCOUNTER — Ambulatory Visit (INDEPENDENT_AMBULATORY_CARE_PROVIDER_SITE_OTHER): Payer: Self-pay | Admitting: *Deleted

## 2014-06-20 ENCOUNTER — Other Ambulatory Visit: Payer: Self-pay | Admitting: *Deleted

## 2014-06-20 ENCOUNTER — Ambulatory Visit (INDEPENDENT_AMBULATORY_CARE_PROVIDER_SITE_OTHER): Payer: Self-pay | Admitting: Physician Assistant

## 2014-06-20 ENCOUNTER — Encounter: Payer: Self-pay | Admitting: Physician Assistant

## 2014-06-20 ENCOUNTER — Ambulatory Visit (HOSPITAL_COMMUNITY): Payer: No Typology Code available for payment source | Attending: Cardiology | Admitting: Radiology

## 2014-06-20 VITALS — BP 100/70 | HR 82 | Ht 68.0 in | Wt 261.0 lb

## 2014-06-20 DIAGNOSIS — I359 Nonrheumatic aortic valve disorder, unspecified: Secondary | ICD-10-CM | POA: Insufficient documentation

## 2014-06-20 DIAGNOSIS — Z954 Presence of other heart-valve replacement: Secondary | ICD-10-CM

## 2014-06-20 DIAGNOSIS — I712 Thoracic aortic aneurysm, without rupture, unspecified: Secondary | ICD-10-CM | POA: Insufficient documentation

## 2014-06-20 DIAGNOSIS — I3139 Other pericardial effusion (noninflammatory): Secondary | ICD-10-CM

## 2014-06-20 DIAGNOSIS — I447 Left bundle-branch block, unspecified: Secondary | ICD-10-CM | POA: Insufficient documentation

## 2014-06-20 DIAGNOSIS — I7121 Aneurysm of the ascending aorta, without rupture: Secondary | ICD-10-CM

## 2014-06-20 DIAGNOSIS — I319 Disease of pericardium, unspecified: Secondary | ICD-10-CM

## 2014-06-20 DIAGNOSIS — I350 Nonrheumatic aortic (valve) stenosis: Secondary | ICD-10-CM

## 2014-06-20 DIAGNOSIS — I428 Other cardiomyopathies: Secondary | ICD-10-CM

## 2014-06-20 DIAGNOSIS — R899 Unspecified abnormal finding in specimens from other organs, systems and tissues: Secondary | ICD-10-CM

## 2014-06-20 DIAGNOSIS — I313 Pericardial effusion (noninflammatory): Secondary | ICD-10-CM

## 2014-06-20 DIAGNOSIS — R0602 Shortness of breath: Secondary | ICD-10-CM | POA: Insufficient documentation

## 2014-06-20 DIAGNOSIS — R55 Syncope and collapse: Secondary | ICD-10-CM

## 2014-06-20 LAB — CBC
HCT: 33.4 % — ABNORMAL LOW (ref 39.0–52.0)
Hemoglobin: 10.9 g/dL — ABNORMAL LOW (ref 13.0–17.0)
MCHC: 32.7 g/dL (ref 30.0–36.0)
MCV: 91.3 fl (ref 78.0–100.0)
PLATELETS: 623 10*3/uL — AB (ref 150.0–400.0)
RBC: 3.65 Mil/uL — AB (ref 4.22–5.81)
RDW: 14.2 % (ref 11.5–15.5)
WBC: 8.5 10*3/uL (ref 4.0–10.5)

## 2014-06-20 LAB — BASIC METABOLIC PANEL
BUN: 14 mg/dL (ref 6–23)
CO2: 31 mEq/L (ref 19–32)
Calcium: 9.1 mg/dL (ref 8.4–10.5)
Chloride: 101 mEq/L (ref 96–112)
Creatinine, Ser: 1.2 mg/dL (ref 0.4–1.5)
GFR: 70.03 mL/min (ref >60.00)
GLUCOSE: 104 mg/dL — AB (ref 70–99)
POTASSIUM: 4.3 meq/L (ref 3.5–5.1)
Sodium: 137 mEq/L (ref 135–145)

## 2014-06-20 LAB — POCT INR: INR: 1.5

## 2014-06-20 MED ORDER — METOPROLOL TARTRATE 25 MG PO TABS: 12.5000 mg | ORAL_TABLET | Freq: Two times a day (BID) | ORAL | Status: DC

## 2014-06-20 NOTE — Progress Notes (Signed)
Limited Echocardiogram performed for Pericardial Effusion.  

## 2014-06-20 NOTE — Addendum Note (Signed)
Addended by: Cindy Hazy T on: 06/20/2014 01:51 PM   Modules accepted: Orders

## 2014-06-20 NOTE — Progress Notes (Signed)
Cardiology Office Note    Date:  06/20/2014   ID:  DONZELL COLLER, DOB Mar 09, 1966, MRN [id number]  PCP:  No PCP Per Patient  Cardiologist:  Dr. Fransico Him      History of Present Illness: Alec Kelly is a 48 y.o. male who was recently seen by Dr. Radford Pax after an abnormal CT demonstrated calcification of the aortic valve. Echocardiogram demonstrated significant aortic stenosis. Cardiac catheterization demonstrated no significant CAD. He was noted to have an ascending thoracic aortic aneurysm.  He was referred to [facility] for Bentall procedure. He was admitted 6/19-6/30. He underwent Bentall procedure with Dr. Cyndia Bent. A mechanical St. Jude aortic valve was used. Postoperative course was complicated by gout flare as well as UTI. Follow up echocardiogram did demonstrate moderate pericardial effusion but no evidence of tamponade.  He returns for follow up.  The patient notes that he was doing well up until this morning. He was standing at the sink and became near syncopal. He tells me that he proceeded to pass out. He denies any head injury. He did lay down later and felt a spinning sensation. He sometimes feels lightheaded with standing. He denies orthopnea, PND or edema. He denies significant chest soreness. He denies significant cough. He denies hemoptysis. His appetite has been sub-par since discharge from the hospital. His wife does not think he is drinking enough fluids.   Studies:  - LHC (05/15/14):  Ostial circumflex 20%, EF 25-30%  - Echo (05/09/14):  EF 25-30%, moderate aortic stenosis, mean gradient 32 mm Hg  - Echo (06/12/14):  Moderate LVH, EF 30-35%, mechanical AVR ok (mean 11 mm Hg), mild LAE, mildly reduced RVSF, mild to moderate RAE, moderate pericardial effusion  - Carotid US (05/31/14):  Bilateral ICA 1-39%   Recent Labs: 05/31/2014: ALT 18  06/06/2014: Creatinine 1.07; Potassium 4.4  06/12/2014: Hemoglobin 9.9*   Wt Readings from Last 3 Encounters:  06/20/14 261 lb  (118.389 kg)  06/13/14 269 lb 2.9 oz (122.1 kg)  06/13/14 269 lb 2.9 oz (122.1 kg)     Past Medical History  Diagnosis Date  . Diverticulitis   . Diverticulitis of sigmoid colon 04/07/2013    With perforation  . Obesity, Class III, BMI 40-49.9 (morbid obesity) 04/07/2013  . 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 Aortic arch aneurysm 2015  . Aortic stenosis 2015    s/p Bentall with mechanical (St Jude AVR) 05/2014  . Personal history of kidney stones   . GERD (gastroesophageal reflux disease)   . Claustrophobia     does not like anything put over his face  . NICM (nonischemic cardiomyopathy)     Echo (06/12/14):  Moderate LVH, EF 30-35%, mechanical AVR ok (mean 11 mm Hg), mild LAE, mildly reduced RVSF, mild to moderate RAE, moderate pericardial effusion  . Carotid stenosis     Carotid US (05/31/14):  Bilateral ICA 1-39%  . Hx of cardiac cath     a. LHC (05/15/14):  Ostial circumflex 20%, EF 25-30%      Current Outpatient Prescriptions  Medication Sig Dispense Refill  . acetaminophen (TYLENOL) 500 MG tablet Take 500-2,000 mg by mouth every 6 (six) hours as needed.      Marland Kitchen allopurinol (ZYLOPRIM) 100 MG tablet Take 1 tablet (100 mg total) by mouth daily as needed (for gout).  30 tablet  0  . aspirin EC 81 MG EC tablet Take 1 tablet (81 mg total) by mouth daily.      Marland Kitchen  metoprolol tartrate (LOPRESSOR) 25 MG tablet Take 1 tablet (25 mg total) by mouth 2 (two) times daily.  60 tablet  1  . oxyCODONE (OXY IR/ROXICODONE) 5 MG immediate release tablet Take 1-2 tablets (5-10 mg total) by mouth every 4 (four) hours as needed for severe pain.  30 tablet  0  . warfarin (COUMADIN) 5 MG tablet Take 1 tablet (5 mg total) by mouth daily at 6 PM.  30 tablet  1   No current facility-administered medications for this visit.    Allergies:   Ivp dye; Morphine and related; and Shellfish-derived products   Social History:  The patient  reports that he has never smoked. He has never used smokeless  tobacco. He reports that he drinks alcohol. He reports that he does not use illicit drugs.   Family History:  The patient's family history includes COPD in his mother; Cancer in his father; Coronary artery disease in his father; Diabetes in his mother; Heart disease in his father; Heart failure in his mother; Hyperlipidemia in his father and mother; Hypertension in his mother.   ROS:  Please see the history of present illness.      All other systems reviewed and negative.   PHYSICAL EXAM: VS:  BP 100/70  Pulse 82  Ht 5\' 8"  (1.727 m)  Wt 261 lb (118.389 kg)  BMI 39.69 kg/m2 Well nourished, well developed, in no acute distress HEENT: normal Neck: no JVD Cardiac:  normal S1, mechanical S2; RRR; no murmurno rub Lungs:  clear to auscultation bilaterally, no wheezing, rhonchi or rales Abd: soft, nontender, no hepatomegaly Ext: no edema Skin: warm and dry Neuro:  CNs 2-12 intact, no focal abnormalities noted  No pulsus paradoxus noted  EKG:  NSR, HR 82, LBBB      ASSESSMENT AND PLAN:  1. Syncope:  He does describe some symptoms that sound consistent with vertigo.  Given his moderate pericardial effusion prior to discharge from the hospital, there is some concern for worsening effusion and tamponade. However, he is not tachycardic. There is no pulsus paradoxus on exam. We did do a limited echocardiogram in the office. This demonstrated stable to improved pericardial effusion and no evidence of tamponade. This was reviewed by Dr. Marlou Porch (DOD). He also spoke with the patient. Upon further interview, the patient is not clear that he did indeed pass out. Check a basic metabolic panel and CBC today. His blood pressure does drop slightly from lying to standing on orthostatic vital signs today. Decrease metoprolol to 12.5 mg twice a day. He will be brought back in close follow up. 2. Pericardial Effusion:  As noted, this was stable to improved by limited echocardiogram today. 3. Aortic stenosis with  ascending aortic aneurysm, s/p Bentall with mechanical AVR:  Continue Coumadin. Limited echo performed today given possible syncope. Otherwise he seems to be progressing well. We'll make sure he is plugged into cardiac rehabilitation at follow up. He will need SBE prophylaxis. 4. NICM (nonischemic cardiomyopathy):  The blood pressure is too soft to increase beta blocker or add ACEI.   5. LBBB (left bundle branch block):  Doubt arrhythmia as a cause of his? Syncope today. 6. Disposition: Followup with me or Dr. Radford Pax in one to 2 weeks.    Signed, Versie Starks, MHS 06/20/2014 11:52 AM    [facility] [place], [place], [place]  [address] Phone: [phone]; Fax: [phone]

## 2014-06-20 NOTE — Patient Instructions (Addendum)
Your physician has requested that you have a limited echocardiogram today. Echocardiography is a painless test that uses sound waves to create images of your heart. It provides your doctor with information about the size and shape of your heart and how well your heart's chambers and valves are working. This procedure takes approximately one hour. There are no restrictions for this procedure.   Your physician has recommended you make the following change in your medication:   1. Decrease Metoprolol 25 mg to 1/2 tablet twice a day.  Your physician recommends that you return for lab work today for cbc and bmet.  Your physician recommends that you schedule a follow-up appointment in: 2 weeks with Dr. Radford Pax or Richardson Dopp, PA  Increase Hydration with water and Gatorade.

## 2014-06-23 ENCOUNTER — Telehealth: Payer: Self-pay

## 2014-06-23 ENCOUNTER — Telehealth: Payer: Self-pay | Admitting: *Deleted

## 2014-06-23 ENCOUNTER — Telehealth: Payer: Self-pay | Admitting: Physician Assistant

## 2014-06-23 NOTE — Telephone Encounter (Signed)
pt notified about echo results and per Brynda Rim. PA and Dr. Radford Pax pt needs to see Dr. Cyndia Bent sooner than 7/22. [facility] w/Dr. Cyndia Bent to 7/15, rsc cvrr/lab in our office to 7/15, cb pt and he said he cannot do that day. Asked me tcb Dr. Vivi Martens to [place] date.

## 2014-06-23 NOTE — Telephone Encounter (Signed)
I reviewed his recent echo with Dr. Fransico Him. We would like to get him back to see Dr. Cyndia Bent sooner than 2 weeks given his persistent effusion after recent Bentall. Can we see if someone at [facility] can see him next week? Thanks, Richardson Dopp, PA-C   06/23/2014 1:44 PM

## 2014-06-23 NOTE — Telephone Encounter (Signed)
Patient instructed to go to ED immediately if he develops increased shortness of breath and dizziness. If not, will call Monday Am and set up appointment time to see Dr Cyndia Bent in the PM.

## 2014-06-23 NOTE — Telephone Encounter (Signed)
duplicate

## 2014-06-23 NOTE — Telephone Encounter (Signed)
pt notified about echo results and per Brynda Rim. PA and Dr. Radford Pax pt needs to see Dr. Cyndia Bent sooner than 7/22. [facility] w/Dr. Cyndia Bent to 7/15, rsc cvrr/lab in our office to 7/15, cb pt and he said he cannot do that day. Asked me tcb Dr. Vivi Martens to [place] date.

## 2014-06-23 NOTE — Telephone Encounter (Signed)
Waiting to hear back from Dr. Vivi Martens office for new appt for pt.

## 2014-06-26 ENCOUNTER — Encounter: Payer: Self-pay | Admitting: Surgery

## 2014-06-26 ENCOUNTER — Ambulatory Visit
Admission: RE | Admit: 2014-06-26 | Discharge: 2014-06-26 | Disposition: A | Discharge status: Home / Self Care | Payer: No Typology Code available for payment source | Source: Ambulatory Visit | Attending: Surgery | Admitting: Surgery

## 2014-06-26 ENCOUNTER — Other Ambulatory Visit: Payer: Self-pay | Admitting: Surgery

## 2014-06-26 ENCOUNTER — Other Ambulatory Visit: Payer: Self-pay | Admitting: *Deleted

## 2014-06-26 ENCOUNTER — Ambulatory Visit (INDEPENDENT_AMBULATORY_CARE_PROVIDER_SITE_OTHER): Payer: No Typology Code available for payment source | Admitting: Surgery

## 2014-06-26 ENCOUNTER — Other Ambulatory Visit (HOSPITAL_COMMUNITY): Payer: Self-pay | Admitting: *Deleted

## 2014-06-26 ENCOUNTER — Encounter (HOSPITAL_COMMUNITY): Payer: Self-pay | Admitting: *Deleted

## 2014-06-26 ENCOUNTER — Telehealth: Payer: Self-pay | Admitting: *Deleted

## 2014-06-26 VITALS — BP 118/77 | HR 105 | Resp 16 | Ht 68.0 in | Wt 261.0 lb

## 2014-06-26 DIAGNOSIS — I719 Aortic aneurysm of unspecified site, without rupture: Secondary | ICD-10-CM

## 2014-06-26 DIAGNOSIS — I712 Thoracic aortic aneurysm, without rupture, unspecified: Secondary | ICD-10-CM

## 2014-06-26 DIAGNOSIS — I359 Nonrheumatic aortic valve disorder, unspecified: Secondary | ICD-10-CM

## 2014-06-26 DIAGNOSIS — I3139 Other pericardial effusion (noninflammatory): Secondary | ICD-10-CM

## 2014-06-26 DIAGNOSIS — I313 Pericardial effusion (noninflammatory): Secondary | ICD-10-CM

## 2014-06-26 DIAGNOSIS — Z09 Encounter for follow-up examination after completed treatment for conditions other than malignant neoplasm: Secondary | ICD-10-CM

## 2014-06-26 DIAGNOSIS — I350 Nonrheumatic aortic (valve) stenosis: Secondary | ICD-10-CM

## 2014-06-26 DIAGNOSIS — I319 Disease of pericardium, unspecified: Secondary | ICD-10-CM

## 2014-06-26 NOTE — Progress Notes (Signed)
Pt states that Dr. Cyndia Bent told him to take his regular medicines in the AM (Metoprolol, Aspirin and Coumadin).

## 2014-06-26 NOTE — Progress Notes (Signed)
Cardiothoracic Surgery   PCP is No PCP Per Patient Referring Provider is Sueanne Margarita, MD  Chief Complaint  Patient presents with  . Routine Post Op    surgical eval on Pericardial effusion seen on ECHO 06/20/14 -Dr Audie Box surgery on 06/02/14    HPI:  The patient is a 48 year old gentleman who underwent Bentall procedure using a 25 mm St. Jude valved graft and replacement of an ascending aortic aneurysm under circulatory arrest on 06/02/2014. His postop course was complicated only by a gout flare in his left foot and knee just prior to discharge. He was started on coumadin postop and initially required a higher dose to bump in the 7.5-10 mg range. He had an echo on 06/12/2014 to evaluate for a pericardial effusion and this showed a moderate sized effusion with greater than 30% respiratory variation of the mitral inflow velocity raising the possibility of early hemodynamic compromise. There was no diastolic collapse of the RA or RV. He was discharge home and has done reasonably well but continues to have some exertional dyspnea. His significant other says he gets out walking with her every day and is " breathing like he just ran a marathon". He had a follow up echo on 06/20/2014 that still showed the effusion and cardiology felt that it was unchanged with no evidence of hemodynamic compromise. My office called him this am to check on him and he was still having exertional dyspnea and some dizziness so we asked him to come in for evaluation. His last INR was 1.5 on 06/20/2014. It was 2.4 on 06/15/2014. His coumadin dose has been increased to 5 mg daily except 7.5 on Tuesday and Saturday.  Past Medical History  Diagnosis Date  . Diverticulitis   . Diverticulitis of sigmoid colon 04/07/2013    With perforation  . Obesity, Class III, BMI 40-49.9 (morbid obesity) 04/07/2013  . 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 Aortic arch aneurysm 2015  . Aortic stenosis 2015    s/p Bentall with  mechanical (St Jude AVR) 05/2014  . Personal history of kidney stones   . GERD (gastroesophageal reflux disease)   . Claustrophobia     does not like anything put over his face  . NICM (nonischemic cardiomyopathy)     Echo (06/12/14):  Moderate LVH, EF 30-35%, mechanical AVR ok (mean 11 mm Hg), mild LAE, mildly reduced RVSF, mild to moderate RAE, moderate pericardial effusion  . Carotid stenosis     Carotid US (05/31/14):  Bilateral ICA 1-39%  . Hx of cardiac cath     a. LHC (05/15/14):  Ostial circumflex 20%, EF 25-30%    Past Surgical History  Procedure Laterality Date  . Tonsillectomy    . Appendectomy      73-58 years old - Dr. Lennie Hummer  . Hernia repair  2/0/2542    umbilical  - At [facility], Dr. Lennie Hummer, Repair of umbilical hernia with mesh.  . Lithotripsy    . Bentall procedure N/A 06/02/2014    Procedure: BENTALL PROCEDURE: 11mm St Jude Valve Conduit ;  Surgeon: Gaye Pollack, MD;  Location: [place];  Service: Open Heart Surgery;  Laterality: N/A;  . Ascending aortic root replacement N/A 06/02/2014    Procedure: Aortic Arch Replacement with 13mm Hemasheild Graft;  Surgeon: Gaye Pollack, MD;  Location: [place];  Service: Open Heart Surgery;  Laterality: N/A;  . Intraoperative transesophageal echocardiogram N/A 06/02/2014  Procedure: INTRAOPERATIVE TRANSESOPHAGEAL ECHOCARDIOGRAM;  Surgeon: Gaye Pollack, MD;  Location: [facility];  Service: Open Heart Surgery;  Laterality: N/A;    Family History  Problem Relation Age of Onset  . Heart failure Mother   . Diabetes Mother   . Hypertension Mother   . Hyperlipidemia Mother   . COPD Mother   . Cancer Father     lung & prostate  . Hyperlipidemia Father   . Heart disease Father   . Coronary artery disease Father     Social History History  Substance Use Topics  . Smoking status: Never Smoker   . Smokeless tobacco: Never Used  . Alcohol Use: Yes     Comment: social    Current Outpatient Prescriptions  Medication Sig Dispense Refill    . acetaminophen (TYLENOL) 500 MG tablet Take 500-2,000 mg by mouth every 6 (six) hours as needed.      Marland Kitchen allopurinol (ZYLOPRIM) 100 MG tablet Take 1 tablet (100 mg total) by mouth daily as needed (for gout).  30 tablet  0  . aspirin EC 81 MG EC tablet Take 1 tablet (81 mg total) by mouth daily.      . metoprolol tartrate (LOPRESSOR) 25 MG tablet Take 0.5 tablets (12.5 mg total) by mouth 2 (two) times daily.  60 tablet  1  . oxyCODONE (OXY IR/ROXICODONE) 5 MG immediate release tablet Take 1-2 tablets (5-10 mg total) by mouth every 4 (four) hours as needed for severe pain.  30 tablet  0  . warfarin (COUMADIN) 5 MG tablet Take 1 tablet (5 mg total) by mouth daily at 6 PM.  30 tablet  1   No current facility-administered medications for this visit.    Allergies  Allergen Reactions  . Ivp Dye [Iodinated Diagnostic Agents] Nausea And Vomiting    ORAL LIQUID   . Morphine And Related   . Shellfish-Derived Products     exacerbates gout    Review of Systems  Constitutional: Positive for appetite change and fatigue. Negative for fever, chills, activity change and unexpected weight change.       Has lost about 20 lbs since discharge.  HENT: Negative.   Eyes: Negative.   Respiratory: Negative for cough and chest tightness.        Exertional shortness of breath  Cardiovascular: Negative for chest pain, palpitations and leg swelling.  Gastrointestinal: Negative.   Endocrine: Negative.   Genitourinary: Negative.   Musculoskeletal: Negative.   Skin: Negative.   Allergic/Immunologic: Negative.   Neurological: Positive for dizziness. Negative for syncope.  Hematological: Negative.   Psychiatric/Behavioral: Negative.     BP 118/77  Pulse 105  Resp 16  Ht 5\' 8"  (1.727 m)  Wt 261 lb (118.389 kg)  BMI 39.69 kg/m2  SpO2 97% Physical Exam  Constitutional: He is oriented to person, place, and time.  Obese white male in no distress  HENT:  Head: Normocephalic and atraumatic.  Mouth/Throat:  Oropharynx is clear and moist.  Eyes: EOM are normal. Pupils are equal, round, and reactive to light.  Neck: Normal range of motion. Neck supple. No thyromegaly present.  Cardiovascular: Normal rate, regular rhythm and intact distal pulses.   No murmur heard. Crisp mechanical valve sounds  Pulmonary/Chest: Effort normal and breath sounds normal. No respiratory distress. He has no rales.  Abdominal: Soft. Bowel sounds are normal. He exhibits no distension and no mass. There is no tenderness.  Musculoskeletal: Normal range of motion. He exhibits no edema.  Lymphadenopathy:  He has no cervical adenopathy.  Neurological: He is alert and oriented to person, place, and time. He has normal strength. No cranial nerve deficit or sensory deficit.  Skin: Skin is warm and dry.  Psychiatric: He has a normal mood and affect.     Diagnostic Tests:  CLINICAL DATA: Thoracic ascending aortic aneurysm. Shortness of  breath.  EXAM:  CHEST 2 VIEW  COMPARISON: 06/12/2014  FINDINGS:  Sequelae of prior ascending aortic root replacement are again  identified. Mild enlargement of the cardiac silhouette and ascending  aortic shadow does not appear significantly changed. The lungs are  clear. No pleural effusion or pneumothorax is seen. Surgical clips  are noted in the right axillary/chest wall region. No acute osseous  abnormality is identified.  IMPRESSION:  No active cardiopulmonary disease.  Electronically Signed  By: Logan Bores  On: 06/26/2014 15:14    *[facility]* [address], [place] [address] [phone]  ------------------------------------------------------------------- Transthoracic Echocardiography  Patient: Alec Kelly, Alec Kelly MR #: [id number] Study Date: 06/20/2014 Gender: M Age: 44 Height: 172.7 cm Weight: 118.6 kg BSA: 2.44 m^2 Pt. Status: Room:  REFERRING Fransico Him, MD ATTENDING Default, Provider [phone] Faylene Million, Nicki Reaper T SONOGRAPHER  Cindy Hazy, RDCS PERFORMING [facility], Outpatient  cc:  ------------------------------------------------------------------- LV EF: 25% - 30%  ------------------------------------------------------------------- Indications: Pericardial effusion 423.9.  ------------------------------------------------------------------- History: PMH: LBBB. Shortness of breath. History of Aortic stenosis.  ------------------------------------------------------------------- Study Conclusions  - Left ventricle: Systolic function was severely reduced. The estimated ejection fraction was in the range of 25% to 30%. - Pericardium, extracardiac: A moderate to large, free-flowing pericardial effusion was identified. There was no evidence of hemodynamic compromise.  ------------------------------------------------------------------- Labs, prior tests, procedures, and surgery: Status post Aortic Valve Replacement-Bentall Procedure with 25 mm St Jude Mechanical Valve Conduit. Status post Aortic Arch Replacement with 30 mm Hemashield graft. Transthoracic echocardiography. M-mode, limited 2D, limited spectral Doppler, and color Doppler. Birthdate: Patient birthdate: 09-Dec-1966. Age: Patient is 48 yr old. Sex: Gender: male. Height: Height: 172.7 cm. Height: 68 in. Weight: Weight: 118.6 kg. Weight: 261 lb. Body mass index: BMI: 39.8 kg/m^2. Body surface area: BSA: 2.44 m^2. Blood pressure: 100/70 Patient status: Outpatient. Study date: Study date: 06/20/2014. Study time: 12:46 PM. Location: [place] [facility]  -------------------------------------------------------------------  ------------------------------------------------------------------- Left ventricle: Systolic function was severely reduced. The estimated ejection fraction was in the range of 25% to 30%.  ------------------------------------------------------------------- Right atrium: The atrium was normal in  size.  ------------------------------------------------------------------- Pericardium: There is a large pericardial effusion. there is no collapse of the RA or RV. A moderate to large, free-flowing pericardial effusion was identified. There was no evidence of hemodynamic compromise.  ------------------------------------------------------------------- Prepared and Electronically Authenticated by  Mertie Moores, M.D. 2015-07-07T18:39:28   Impression:  He has a moderate to large pericardial effusion following Bentall and ascending aortic replacement and is on coumadin for a mechanical valve. There is some stranding in the fluid suggesting that it is hemorrhagic. While there are no signs of tamponade on his recent echo I think it is enough fluid to cause exertional dyspnea especially with a reduced EF of 30%. If this is hemorrhagic it will likely continue to enlarge. I think it is best to drain this by subxyphoid pericardial window. I discussed the operative procedure with the patient and his significant other including alternatives, benefits and risks; including but not limited to bleeding, blood transfusion, infection, recurrence of the effusion.   Conn Trombetta Oglesby understands and agrees to proceed.  We will schedule  surgery for tomorrow.   Plan:  Subxyphoid pericardial window tomorrow.

## 2014-06-26 NOTE — Telephone Encounter (Signed)
Patient just scheduled for sub xyphoid pericardial window for Tuesday July 14th per Dr. Cyndia Bent.  Patient is aware to arrive to short stay at 8:30am for same day labs, nothing to eat or drink after mid-night and to continue his blood thinner.  He understands.

## 2014-06-27 ENCOUNTER — Inpatient Hospital Stay (HOSPITAL_COMMUNITY): Payer: Self-pay

## 2014-06-27 ENCOUNTER — Inpatient Hospital Stay (HOSPITAL_COMMUNITY): Payer: No Typology Code available for payment source

## 2014-06-27 ENCOUNTER — Inpatient Hospital Stay: Payer: Self-pay | Admitting: Internal Medicine

## 2014-06-27 ENCOUNTER — Inpatient Hospital Stay (HOSPITAL_COMMUNITY): Payer: Self-pay | Admitting: Certified Registered Nurse Anesthetist

## 2014-06-27 ENCOUNTER — Encounter (HOSPITAL_COMMUNITY)
Admission: RE | Disposition: A | Discharge status: Home / Self Care | Payer: Self-pay | Source: Ambulatory Visit | Attending: Surgery

## 2014-06-27 ENCOUNTER — Other Ambulatory Visit: Payer: Self-pay

## 2014-06-27 ENCOUNTER — Inpatient Hospital Stay (HOSPITAL_COMMUNITY)
Admission: RE | Admit: 2014-06-27 | Discharge: 2014-07-02 | DRG: 238 | Disposition: A | Discharge status: Home / Self Care | Payer: Self-pay | Source: Ambulatory Visit | Attending: Surgery | Admitting: Surgery

## 2014-06-27 ENCOUNTER — Encounter (HOSPITAL_COMMUNITY): Payer: Self-pay | Admitting: Certified Registered Nurse Anesthetist

## 2014-06-27 DIAGNOSIS — Z79899 Other long term (current) drug therapy: Secondary | ICD-10-CM

## 2014-06-27 DIAGNOSIS — Z6839 Body mass index (BMI) 39.0-39.9, adult: Secondary | ICD-10-CM

## 2014-06-27 DIAGNOSIS — I312 Hemopericardium, not elsewhere classified: Principal | ICD-10-CM | POA: Diagnosis present

## 2014-06-27 DIAGNOSIS — R5381 Other malaise: Secondary | ICD-10-CM | POA: Diagnosis present

## 2014-06-27 DIAGNOSIS — K219 Gastro-esophageal reflux disease without esophagitis: Secondary | ICD-10-CM | POA: Diagnosis present

## 2014-06-27 DIAGNOSIS — I447 Left bundle-branch block, unspecified: Secondary | ICD-10-CM | POA: Diagnosis present

## 2014-06-27 DIAGNOSIS — Z7982 Long term (current) use of aspirin: Secondary | ICD-10-CM

## 2014-06-27 DIAGNOSIS — M109 Gout, unspecified: Secondary | ICD-10-CM | POA: Diagnosis present

## 2014-06-27 DIAGNOSIS — Z954 Presence of other heart-valve replacement: Secondary | ICD-10-CM

## 2014-06-27 DIAGNOSIS — I313 Pericardial effusion (noninflammatory): Secondary | ICD-10-CM

## 2014-06-27 DIAGNOSIS — I3139 Other pericardial effusion (noninflammatory): Secondary | ICD-10-CM

## 2014-06-27 DIAGNOSIS — Z7901 Long term (current) use of anticoagulants: Secondary | ICD-10-CM

## 2014-06-27 DIAGNOSIS — I428 Other cardiomyopathies: Secondary | ICD-10-CM | POA: Diagnosis present

## 2014-06-27 DIAGNOSIS — J90 Pleural effusion, not elsewhere classified: Secondary | ICD-10-CM

## 2014-06-27 HISTORY — PX: SUBXYPHOID PERICARDIAL WINDOW: SHX5075

## 2014-06-27 HISTORY — PX: INTRAOPERATIVE TRANSESOPHAGEAL ECHOCARDIOGRAM: SHX5062

## 2014-06-27 LAB — PROTIME-INR
INR: 1.77 — AB (ref 0.00–1.49)
PROTHROMBIN TIME: 20.6 s — AB (ref 11.6–15.2)

## 2014-06-27 LAB — GLUCOSE, CAPILLARY
GLUCOSE-CAPILLARY: 100 mg/dL — AB (ref 70–99)
Glucose-Capillary: 89 mg/dL (ref 70–99)
Glucose-Capillary: 99 mg/dL (ref 70–99)

## 2014-06-27 LAB — COMPREHENSIVE METABOLIC PANEL
ALBUMIN: 3.6 g/dL (ref 3.5–5.2)
ALT: 70 U/L — ABNORMAL HIGH (ref 0–53)
AST: 42 U/L — ABNORMAL HIGH (ref 0–37)
Alkaline Phosphatase: 69 U/L (ref 39–117)
Anion gap: 15 (ref 5–15)
BUN: 13 mg/dL (ref 6–23)
CALCIUM: 9.1 mg/dL (ref 8.4–10.5)
CO2: 23 mEq/L (ref 19–32)
CREATININE: 1 mg/dL (ref 0.50–1.35)
Chloride: 101 mEq/L (ref 96–112)
GFR calc Af Amer: >90 mL/min (ref >90)
GFR, EST NON AFRICAN AMERICAN: 87 mL/min — AB (ref >90)
Glucose, Bld: 102 mg/dL — ABNORMAL HIGH (ref 70–99)
Potassium: 4.5 mEq/L (ref 3.7–5.3)
Sodium: 139 mEq/L (ref 137–147)
Total Bilirubin: 0.4 mg/dL (ref 0.3–1.2)
Total Protein: 7.5 g/dL (ref 6.0–8.3)

## 2014-06-27 LAB — BLOOD GAS, ARTERIAL
Acid-Base Excess: 1 mmol/L (ref 0.0–2.0)
Bicarbonate: 24.9 mEq/L — ABNORMAL HIGH (ref 20.0–24.0)
DRAWN BY: 206361
FIO2: 0.21 %
O2 SAT: 96.5 %
PATIENT TEMPERATURE: 98.6
TCO2: 26.1 mmol/L (ref 0–100)
pCO2 arterial: 38.6 mmHg (ref 35.0–45.0)
pH, Arterial: 7.426 (ref 7.350–7.450)
pO2, Arterial: 84.6 mmHg (ref 80.0–100.0)

## 2014-06-27 LAB — TYPE AND SCREEN
ABO/RH(D): O POS
ANTIBODY SCREEN: NEGATIVE

## 2014-06-27 LAB — CBC
HEMATOCRIT: 37.1 % — AB (ref 39.0–52.0)
Hemoglobin: 11.8 g/dL — ABNORMAL LOW (ref 13.0–17.0)
MCH: 29.2 pg (ref 26.0–34.0)
MCHC: 31.8 g/dL (ref 30.0–36.0)
MCV: 91.8 fL (ref 78.0–100.0)
PLATELETS: 497 10*3/uL — AB (ref 150–400)
RBC: 4.04 MIL/uL — ABNORMAL LOW (ref 4.22–5.81)
RDW: 13.4 % (ref 11.5–15.5)
WBC: 7 10*3/uL (ref 4.0–10.5)

## 2014-06-27 LAB — APTT: APTT: 41 s — AB (ref 24–37)

## 2014-06-27 SURGERY — CREATION, PERICARDIAL WINDOW, SUBXIPHOID APPROACH
Anesthesia: General

## 2014-06-27 MED ORDER — SUCCINYLCHOLINE CHLORIDE 20 MG/ML IJ SOLN
INTRAMUSCULAR | Status: DC | PRN
  Administered 2014-06-27: 140 mg via INTRAVENOUS

## 2014-06-27 MED ORDER — ASPIRIN EC 81 MG PO TBEC
81.0000 mg | DELAYED_RELEASE_TABLET | Freq: Every day | ORAL | Status: DC
  Administered 2014-06-28 – 2014-07-01 (×4): 81 mg via ORAL
  Filled 2014-06-27 (×5): qty 1

## 2014-06-27 MED ORDER — HYDROMORPHONE HCL PF 1 MG/ML IJ SOLN
INTRAMUSCULAR | Status: AC
  Filled 2014-06-27: qty 1

## 2014-06-27 MED ORDER — ONDANSETRON HCL 4 MG/2ML IJ SOLN: 4.0000 mg | Freq: Four times a day (QID) | INTRAMUSCULAR | Status: DC | PRN

## 2014-06-27 MED ORDER — FENTANYL CITRATE 0.05 MG/ML IJ SOLN
50.0000 ug | INTRAMUSCULAR | Status: DC | PRN
  Administered 2014-06-27 (×2): 50 ug via INTRAVENOUS
  Filled 2014-06-27 (×2): qty 2

## 2014-06-27 MED ORDER — MIDAZOLAM HCL 2 MG/2ML IJ SOLN
INTRAMUSCULAR | Status: AC
  Filled 2014-06-27: qty 2

## 2014-06-27 MED ORDER — 0.9 % SODIUM CHLORIDE (POUR BTL) OPTIME
TOPICAL | Status: DC | PRN
  Administered 2014-06-27: 1000 mL

## 2014-06-27 MED ORDER — PROPOFOL 10 MG/ML IV BOLUS
INTRAVENOUS | Status: DC | PRN
  Administered 2014-06-27: 80 mg via INTRAVENOUS

## 2014-06-27 MED ORDER — PHENYLEPHRINE HCL 10 MG/ML IJ SOLN
10.0000 mg | INTRAVENOUS | Status: DC | PRN
  Administered 2014-06-27: 25 ug/min via INTRAVENOUS

## 2014-06-27 MED ORDER — ONDANSETRON HCL 4 MG/2ML IJ SOLN
INTRAMUSCULAR | Status: DC | PRN
Start: 2014-06-27 — End: 2014-06-27
  Administered 2014-06-27: 4 mg via INTRAVENOUS

## 2014-06-27 MED ORDER — EPHEDRINE SULFATE 50 MG/ML IJ SOLN
INTRAMUSCULAR | Status: AC
  Filled 2014-06-27: qty 1

## 2014-06-27 MED ORDER — ACETAMINOPHEN 650 MG RE SUPP: 650.0000 mg | Freq: Once | RECTAL | Status: DC

## 2014-06-27 MED ORDER — NEOSTIGMINE METHYLSULFATE 10 MG/10ML IV SOLN
INTRAVENOUS | Status: AC
  Filled 2014-06-27: qty 1

## 2014-06-27 MED ORDER — ALLOPURINOL 100 MG PO TABS
100.0000 mg | ORAL_TABLET | Freq: Every day | ORAL | Status: DC | PRN
  Administered 2014-06-28: 100 mg via ORAL
  Filled 2014-06-27 (×2): qty 1

## 2014-06-27 MED ORDER — SODIUM CHLORIDE 0.9 % IV SOLN
INTRAVENOUS | Status: DC
  Administered 2014-06-27 – 2014-06-28 (×2): via INTRAVENOUS

## 2014-06-27 MED ORDER — BISACODYL 5 MG PO TBEC
10.0000 mg | DELAYED_RELEASE_TABLET | Freq: Every day | ORAL | Status: DC
  Administered 2014-06-28: 10 mg via ORAL
  Filled 2014-06-27: qty 2

## 2014-06-27 MED ORDER — MIDAZOLAM HCL 5 MG/5ML IJ SOLN
INTRAMUSCULAR | Status: DC | PRN
  Administered 2014-06-27: 2 mg via INTRAVENOUS

## 2014-06-27 MED ORDER — DEXTROSE 5 % IV SOLN
1.5000 g | INTRAVENOUS | Status: AC
  Administered 2014-06-27: 1.5 g via INTRAVENOUS
  Filled 2014-06-27: qty 1.5

## 2014-06-27 MED ORDER — LIDOCAINE HCL (CARDIAC) 20 MG/ML IV SOLN
INTRAVENOUS | Status: DC | PRN
  Administered 2014-06-27: 100 mg via INTRAVENOUS

## 2014-06-27 MED ORDER — ROCURONIUM BROMIDE 100 MG/10ML IV SOLN
INTRAVENOUS | Status: DC | PRN
  Administered 2014-06-27: 50 mg via INTRAVENOUS
  Administered 2014-06-27: 10 mg via INTRAVENOUS

## 2014-06-27 MED ORDER — OXYCODONE HCL 5 MG PO TABS: 5.0000 mg | ORAL_TABLET | Freq: Once | ORAL | Status: DC | PRN

## 2014-06-27 MED ORDER — ACETAMINOPHEN 160 MG/5ML PO SOLN: 1000.0000 mg | Freq: Four times a day (QID) | ORAL | Status: DC

## 2014-06-27 MED ORDER — DOCUSATE SODIUM 100 MG PO CAPS
200.0000 mg | ORAL_CAPSULE | Freq: Every day | ORAL | Status: DC
  Administered 2014-06-28: 200 mg via ORAL
  Filled 2014-06-27: qty 2

## 2014-06-27 MED ORDER — SODIUM CHLORIDE 0.9 % IV SOLN
250.0000 mL | INTRAVENOUS | Status: DC
  Administered 2014-06-28: 250 mL via INTRAVENOUS

## 2014-06-27 MED ORDER — SODIUM CHLORIDE 0.9 % IJ SOLN: 3.0000 mL | Freq: Two times a day (BID) | INTRAMUSCULAR | Status: DC

## 2014-06-27 MED ORDER — NEOSTIGMINE METHYLSULFATE 10 MG/10ML IV SOLN
INTRAVENOUS | Status: DC | PRN
Start: 2014-06-27 — End: 2014-06-27
  Administered 2014-06-27: 5 mg via INTRAVENOUS

## 2014-06-27 MED ORDER — LACTATED RINGERS IV SOLN
INTRAVENOUS | Status: DC
  Administered 2014-06-27 (×3): via INTRAVENOUS

## 2014-06-27 MED ORDER — DEXTROSE 5 % IV SOLN
1.5000 g | Freq: Two times a day (BID) | INTRAVENOUS | Status: DC
  Administered 2014-06-27 – 2014-06-28 (×2): 1.5 g via INTRAVENOUS
  Filled 2014-06-27 (×3): qty 1.5

## 2014-06-27 MED ORDER — FENTANYL CITRATE 0.05 MG/ML IJ SOLN
INTRAMUSCULAR | Status: DC | PRN
  Administered 2014-06-27: 100 ug via INTRAVENOUS
  Administered 2014-06-27: 150 ug via INTRAVENOUS

## 2014-06-27 MED ORDER — ONDANSETRON HCL 4 MG/2ML IJ SOLN: 4.0000 mg | Freq: Once | INTRAMUSCULAR | Status: DC | PRN

## 2014-06-27 MED ORDER — FENTANYL CITRATE 0.05 MG/ML IJ SOLN
INTRAMUSCULAR | Status: AC
  Filled 2014-06-27: qty 5

## 2014-06-27 MED ORDER — PANTOPRAZOLE SODIUM 40 MG PO TBEC: 40.0000 mg | DELAYED_RELEASE_TABLET | Freq: Every day | ORAL | Status: DC

## 2014-06-27 MED ORDER — INSULIN ASPART 100 UNIT/ML ~~LOC~~ SOLN: 0.0000 [IU] | SUBCUTANEOUS | Status: DC

## 2014-06-27 MED ORDER — ALBUMIN HUMAN 5 % IV SOLN: 250.0000 mL | INTRAVENOUS | Status: DC | PRN

## 2014-06-27 MED ORDER — LIDOCAINE HCL (CARDIAC) 20 MG/ML IV SOLN
INTRAVENOUS | Status: AC
  Filled 2014-06-27: qty 10

## 2014-06-27 MED ORDER — PHENYLEPHRINE 40 MCG/ML (10ML) SYRINGE FOR IV PUSH (FOR BLOOD PRESSURE SUPPORT)
PREFILLED_SYRINGE | INTRAVENOUS | Status: AC
Start: 2014-06-27 — End: 2014-06-27
  Filled 2014-06-27: qty 10

## 2014-06-27 MED ORDER — ROCURONIUM BROMIDE 50 MG/5ML IV SOLN
INTRAVENOUS | Status: AC
  Filled 2014-06-27: qty 2

## 2014-06-27 MED ORDER — MEPERIDINE HCL 25 MG/ML IJ SOLN: 6.2500 mg | INTRAMUSCULAR | Status: DC | PRN

## 2014-06-27 MED ORDER — DEXTROSE 5 % IV SOLN
0.0000 ug/min | INTRAVENOUS | Status: DC
  Filled 2014-06-27: qty 2

## 2014-06-27 MED ORDER — ROCURONIUM BROMIDE 50 MG/5ML IV SOLN
INTRAVENOUS | Status: AC
  Filled 2014-06-27: qty 1

## 2014-06-27 MED ORDER — MIDAZOLAM HCL 2 MG/2ML IJ SOLN: 2.0000 mg | INTRAMUSCULAR | Status: DC | PRN

## 2014-06-27 MED ORDER — SODIUM CHLORIDE 0.9 % IJ SOLN
INTRAMUSCULAR | Status: AC
  Filled 2014-06-27: qty 10

## 2014-06-27 MED ORDER — WARFARIN - PHARMACIST DOSING INPATIENT
Freq: Every day | Status: DC
  Administered 2014-06-27 – 2014-06-30 (×3)

## 2014-06-27 MED ORDER — SODIUM CHLORIDE 0.9 % IJ SOLN: 3.0000 mL | INTRAMUSCULAR | Status: DC | PRN

## 2014-06-27 MED ORDER — HYDROMORPHONE HCL PF 1 MG/ML IJ SOLN
0.2500 mg | INTRAMUSCULAR | Status: DC | PRN
  Administered 2014-06-27: 0.5 mg via INTRAVENOUS

## 2014-06-27 MED ORDER — ACETAMINOPHEN 500 MG PO TABS
1000.0000 mg | ORAL_TABLET | Freq: Four times a day (QID) | ORAL | Status: DC
Start: 2014-06-28 — End: 2014-06-28
  Administered 2014-06-28 (×2): 1000 mg via ORAL
  Filled 2014-06-27 (×6): qty 2

## 2014-06-27 MED ORDER — OXYCODONE HCL 5 MG/5ML PO SOLN: 5.0000 mg | Freq: Once | ORAL | Status: DC | PRN

## 2014-06-27 MED ORDER — ACETAMINOPHEN 160 MG/5ML PO SOLN: 650.0000 mg | Freq: Once | ORAL | Status: DC

## 2014-06-27 MED ORDER — WARFARIN SODIUM 7.5 MG PO TABS
7.5000 mg | ORAL_TABLET | Freq: Once | ORAL | Status: AC
  Administered 2014-06-27: 7.5 mg via ORAL
  Filled 2014-06-27: qty 1

## 2014-06-27 MED ORDER — INSULIN REGULAR BOLUS VIA INFUSION
0.0000 [IU] | Freq: Three times a day (TID) | INTRAVENOUS | Status: DC
  Filled 2014-06-27: qty 10

## 2014-06-27 MED ORDER — GLYCOPYRROLATE 0.2 MG/ML IJ SOLN
INTRAMUSCULAR | Status: AC
  Filled 2014-06-27: qty 2

## 2014-06-27 MED ORDER — BISACODYL 10 MG RE SUPP: 10.0000 mg | Freq: Every day | RECTAL | Status: DC

## 2014-06-27 MED ORDER — GLYCOPYRROLATE 0.2 MG/ML IJ SOLN
INTRAMUSCULAR | Status: DC | PRN
  Administered 2014-06-27: .8 mg via INTRAVENOUS

## 2014-06-27 MED ORDER — OXYCODONE HCL 5 MG PO TABS
5.0000 mg | ORAL_TABLET | ORAL | Status: DC | PRN
  Administered 2014-06-28: 5 mg via ORAL
  Filled 2014-06-27: qty 1

## 2014-06-27 SURGICAL SUPPLY — 50 items
ATTRACTOMAT 16X20 MAGNETIC DRP (DRAPES) ×3 IMPLANT
CANISTER SUCTION 2500CC (MISCELLANEOUS) ×3 IMPLANT
CATH THORACIC 28FR (CATHETERS) IMPLANT
CATH THORACIC 28FR RT ANG (CATHETERS) IMPLANT
CATH THORACIC 36FR (CATHETERS) IMPLANT
CATH THORACIC 36FR RT ANG (CATHETERS) IMPLANT
CLIP TI MEDIUM 24 (CLIP) ×3 IMPLANT
CLIP TI WIDE RED SMALL 24 (CLIP) ×3 IMPLANT
CONT SPEC 4OZ CLIKSEAL STRL BL (MISCELLANEOUS) ×3 IMPLANT
COVER SURGICAL LIGHT HANDLE (MISCELLANEOUS) ×3 IMPLANT
DERMABOND ADVANCED (GAUZE/BANDAGES/DRESSINGS) ×2
DERMABOND ADVANCED .7 DNX12 (GAUZE/BANDAGES/DRESSINGS) ×1 IMPLANT
DRAIN CHANNEL 28F RND 3/8 FF (WOUND CARE) IMPLANT
DRAIN CHANNEL 32F RND 10.7 FF (WOUND CARE) ×3 IMPLANT
DRAPE LAPAROSCOPIC ABDOMINAL (DRAPES) ×3 IMPLANT
ELECT REM PT RETURN 9FT ADLT (ELECTROSURGICAL) ×3
ELECTRODE REM PT RTRN 9FT ADLT (ELECTROSURGICAL) ×1 IMPLANT
GLOVE BIOGEL M 6.5 STRL (GLOVE) ×12 IMPLANT
GLOVE BIOGEL M STER SZ 6 (GLOVE) ×3 IMPLANT
GLOVE BIOGEL PI IND STRL 7.0 (GLOVE) ×1 IMPLANT
GLOVE BIOGEL PI INDICATOR 7.0 (GLOVE) ×2
GLOVE EUDERMIC 7 POWDERFREE (GLOVE) ×3 IMPLANT
GOWN STRL REUS W/ TWL LRG LVL3 (GOWN DISPOSABLE) ×2 IMPLANT
GOWN STRL REUS W/ TWL XL LVL3 (GOWN DISPOSABLE) ×1 IMPLANT
GOWN STRL REUS W/TWL LRG LVL3 (GOWN DISPOSABLE) ×4
GOWN STRL REUS W/TWL XL LVL3 (GOWN DISPOSABLE) ×2
HEMOSTAT POWDER SURGIFOAM 1G (HEMOSTASIS) IMPLANT
KIT BASIN OR (CUSTOM PROCEDURE TRAY) ×3 IMPLANT
KIT ROOM TURNOVER OR (KITS) ×3 IMPLANT
NS IRRIG 1000ML POUR BTL (IV SOLUTION) ×3 IMPLANT
PACK CHEST (CUSTOM PROCEDURE TRAY) ×3 IMPLANT
PAD ARMBOARD 7.5X6 YLW CONV (MISCELLANEOUS) ×6 IMPLANT
PAD ELECT DEFIB RADIOL ZOLL (MISCELLANEOUS) ×3 IMPLANT
SPONGE GAUZE 4X4 12PLY (GAUZE/BANDAGES/DRESSINGS) ×3 IMPLANT
SPONGE GAUZE 4X4 12PLY STER LF (GAUZE/BANDAGES/DRESSINGS) ×3 IMPLANT
SUT VIC AB 1 CTX 18 (SUTURE) ×3 IMPLANT
SUT VIC AB 2-0 CT1 27 (SUTURE) ×2
SUT VIC AB 2-0 CT1 TAPERPNT 27 (SUTURE) ×1 IMPLANT
SUT VIC AB 3-0 X1 27 (SUTURE) ×3 IMPLANT
SWAB COLLECTION DEVICE MRSA (MISCELLANEOUS) IMPLANT
SYR 50ML SLIP (SYRINGE) IMPLANT
SYRINGE 10CC LL (SYRINGE) IMPLANT
SYSTEM SAHARA CHEST DRAIN ATS (WOUND CARE) ×3 IMPLANT
TAPE CLOTH SURG 4X10 WHT LF (GAUZE/BANDAGES/DRESSINGS) ×3 IMPLANT
TOWEL OR 17X24 6PK STRL BLUE (TOWEL DISPOSABLE) ×3 IMPLANT
TOWEL OR 17X26 10 PK STRL BLUE (TOWEL DISPOSABLE) ×3 IMPLANT
TRAP SPECIMEN MUCOUS 40CC (MISCELLANEOUS) ×3 IMPLANT
TRAY FOLEY CATH 14FRSI W/METER (CATHETERS) ×3 IMPLANT
TUBE ANAEROBIC SPECIMEN COL (MISCELLANEOUS) IMPLANT
WATER STERILE IRR 1000ML POUR (IV SOLUTION) ×6 IMPLANT

## 2014-06-27 NOTE — Progress Notes (Signed)
  Echocardiogram Echocardiogram Transesophageal has been performed.  Alec Kelly 06/27/2014, 12:48 PM

## 2014-06-27 NOTE — H&P (Signed)
[address]       [place] [address]             [phone]       Cardiothoracic Surgery History and Physical   PCP is No PCP Per Patient  Referring Provider is Sueanne Margarita, MD  Chief Complaint   Patient presents with   .  Routine Post Op     surgical eval on Pericardial effusion seen on ECHO 06/20/14 -Dr Audie Box surgery on 06/02/14   HPI:  The patient is a 48 year old gentleman who underwent Bentall procedure using a 25 mm St. Jude valved graft and replacement of an ascending aortic aneurysm under circulatory arrest on 06/02/2014. His postop course was complicated only by a gout flare in his left foot and knee just prior to discharge. He was started on coumadin postop and initially required a higher dose to bump in the 7.5-10 mg range. He had an echo on 06/12/2014 to evaluate for a pericardial effusion and this showed a moderate sized effusion with greater than 30% respiratory variation of the mitral inflow velocity raising the possibility of early hemodynamic compromise. There was no diastolic collapse of the RA or RV. He was discharge home and has done reasonably well but continues to have some exertional dyspnea. His significant other says he gets out walking with her every day and is " breathing like he just ran a marathon". He had a follow up echo on 06/20/2014 that still showed the effusion and cardiology felt that it was unchanged with no evidence of hemodynamic compromise. My office called him this am to check on him and he was still having exertional dyspnea and some dizziness so we asked him to come in for evaluation. His last INR was 1.5 on 06/20/2014. It was 2.4 on 06/15/2014. His coumadin dose has been increased to 5 mg daily except 7.5 on Tuesday and Saturday.  Past Medical History   Diagnosis  Date   .  Diverticulitis    .  Diverticulitis of sigmoid colon  04/07/2013     With perforation   .  Obesity, Class III, BMI 40-49.9 (morbid obesity)   04/07/2013   .  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  Aortic arch aneurysm  2015   .  Aortic stenosis  2015     s/p Bentall with mechanical (St Jude AVR) 05/2014   .  Personal history of kidney stones    .  GERD (gastroesophageal reflux disease)    .  Claustrophobia      does not like anything put over his face   .  NICM (nonischemic cardiomyopathy)      Echo (06/12/14): Moderate LVH, EF 30-35%, mechanical AVR ok (mean 11 mm Hg), mild LAE, mildly reduced RVSF, mild to moderate RAE, moderate pericardial effusion   .  Carotid stenosis      Carotid US (05/31/14): Bilateral ICA 1-39%   .  Hx of cardiac cath      a. LHC (05/15/14): Ostial circumflex 20%, EF 25-30%    Past Surgical History   Procedure  Laterality  Date   .  Tonsillectomy     .  Appendectomy       80-64 years old - Dr. Lennie Hummer   .  Hernia repair   07/16/9936     umbilical - At [facility], Dr. Lennie Hummer, Repair of umbilical hernia with mesh.   Marland Kitchen  Lithotripsy     .  Bentall procedure  N/A  06/02/2014     Procedure: BENTALL PROCEDURE: 30mm St Jude Valve Conduit ; Surgeon: Gaye Pollack, MD; Location: [place]; Service: Open Heart Surgery; Laterality: N/A;   .  Ascending aortic root replacement  N/A  06/02/2014     Procedure: Aortic Arch Replacement with 31mm Hemasheild Graft; Surgeon: Gaye Pollack, MD; Location: [place]; Service: Open Heart Surgery; Laterality: N/A;   .  Intraoperative transesophageal echocardiogram  N/A  06/02/2014     Procedure: INTRAOPERATIVE TRANSESOPHAGEAL ECHOCARDIOGRAM; Surgeon: Gaye Pollack, MD; Location: [facility] OR; Service: Open Heart Surgery; Laterality: N/A;    Family History   Problem  Relation  Age of Onset   .  Heart failure  Mother    .  Diabetes  Mother    .  Hypertension  Mother    .  Hyperlipidemia  Mother    .  COPD  Mother    .  Cancer  Father      lung & prostate   .  Hyperlipidemia  Father    .  Heart disease  Father    .  Coronary artery disease  Father    Social History  History     Substance Use Topics   .  Smoking status:  Never Smoker   .  Smokeless tobacco:  Never Used   .  Alcohol Use:  Yes      Comment: social    Current Outpatient Prescriptions   Medication  Sig  Dispense  Refill   .  acetaminophen (TYLENOL) 500 MG tablet  Take 500-2,000 mg by mouth every 6 (six) hours as needed.     Marland Kitchen  allopurinol (ZYLOPRIM) 100 MG tablet  Take 1 tablet (100 mg total) by mouth daily as needed (for gout).  30 tablet  0   .  aspirin EC 81 MG EC tablet  Take 1 tablet (81 mg total) by mouth daily.     .  metoprolol tartrate (LOPRESSOR) 25 MG tablet  Take 0.5 tablets (12.5 mg total) by mouth 2 (two) times daily.  60 tablet  1   .  oxyCODONE (OXY IR/ROXICODONE) 5 MG immediate release tablet  Take 1-2 tablets (5-10 mg total) by mouth every 4 (four) hours as needed for severe pain.  30 tablet  0   .  warfarin (COUMADIN) 5 MG tablet  Take 1 tablet (5 mg total) by mouth daily at 6 PM.  30 tablet  1    No current facility-administered medications for this visit.    Allergies   Allergen  Reactions   .  Ivp Dye [Iodinated Diagnostic Agents]  Nausea And Vomiting     ORAL LIQUID   .  Morphine And Related    .  Shellfish-Derived Products      exacerbates gout    Review of Systems   Constitutional: Positive for appetite change and fatigue. Negative for fever, chills, activity change and unexpected weight change.  Has lost about 20 lbs since discharge.  HENT: Negative.  Eyes: Negative.  Respiratory: Negative for cough and chest tightness.  Exertional shortness of breath  Cardiovascular: Negative for chest pain, palpitations and leg swelling.  Gastrointestinal: Negative.  Endocrine: Negative.  Genitourinary: Negative.  Musculoskeletal: Negative.  Skin: Negative.  Allergic/Immunologic: Negative.  Neurological: Positive for dizziness. Negative for syncope.  Hematological: Negative.  Psychiatric/Behavioral: Negative.   BP 118/77  Pulse 105  Resp 16  Ht 5\' 8"  (1.727  m)  Wt 261  lb (118.389 kg)  BMI 39.69 kg/m2  SpO2 97%   Physical Exam   Constitutional: He is oriented to person, place, and time.  Obese white male in no distress  HENT:  Head: Normocephalic and atraumatic.  Mouth/Throat: Oropharynx is clear and moist.  Eyes: EOM are normal. Pupils are equal, round, and reactive to light.  Neck: Normal range of motion. Neck supple. No thyromegaly present.  Cardiovascular: Normal rate, regular rhythm and intact distal pulses.  No murmur heard. Crisp mechanical valve sounds  Pulmonary/Chest: Effort normal and breath sounds normal. No respiratory distress. He has no rales.  Abdominal: Soft. Bowel sounds are normal. He exhibits no distension and no mass. There is no tenderness.  Musculoskeletal: Normal range of motion. He exhibits no edema.  Lymphadenopathy:  He has no cervical adenopathy.  Neurological: He is alert and oriented to person, place, and time. He has normal strength. No cranial nerve deficit or sensory deficit.  Skin: Skin is warm and dry.  Psychiatric: He has a normal mood and affect.    Diagnostic Tests:   CLINICAL DATA: Thoracic ascending aortic aneurysm. Shortness of  breath.  EXAM:  CHEST 2 VIEW  COMPARISON: 06/12/2014  FINDINGS:  Sequelae of prior ascending aortic root replacement are again  identified. Mild enlargement of the cardiac silhouette and ascending  aortic shadow does not appear significantly changed. The lungs are  clear. No pleural effusion or pneumothorax is seen. Surgical clips  are noted in the right axillary/chest wall region. No acute osseous  abnormality is identified.  IMPRESSION:  No active cardiopulmonary disease.  Electronically Signed  By: Logan Bores  On: 06/26/2014 15:14   *[facility]* [address], [place] [address] [phone]  ------------------------------------------------------------------- Transthoracic Echocardiography  Patient: Cassian, Torelli MR #: [id number] Study  Date: 06/20/2014 Gender: M Age: 29 Height: 172.7 cm Weight: 118.6 kg BSA: 2.44 m^2 Pt. Status: Room:  REFERRING Fransico Him, MD ATTENDING Default, Provider [phone] Faylene Million, Nicki Reaper T SONOGRAPHER Cindy Hazy, RDCS PERFORMING [facility], Outpatient  cc:  ------------------------------------------------------------------- LV EF: 25% - 30%  ------------------------------------------------------------------- Indications: Pericardial effusion 423.9.  ------------------------------------------------------------------- History: PMH: LBBB. Shortness of breath. History of Aortic stenosis.  ------------------------------------------------------------------- Study Conclusions  - Left ventricle: Systolic function was severely reduced. The estimated ejection fraction was in the range of 25% to 30%. - Pericardium, extracardiac: A moderate to large, free-flowing pericardial effusion was identified. There was no evidence of hemodynamic compromise.  ------------------------------------------------------------------- Labs, prior tests, procedures, and surgery: Status post Aortic Valve Replacement-Bentall Procedure with 25 mm St Jude Mechanical Valve Conduit. Status post Aortic Arch Replacement with 30 mm Hemashield graft. Transthoracic echocardiography. M-mode, limited 2D, limited spectral Doppler, and color Doppler. Birthdate: Patient birthdate: 18-Oct-1966. Age: Patient is 48 yr old. Sex: Gender: male. Height: Height: 172.7 cm. Height: 68 in. Weight: Weight: 118.6 kg. Weight: 261 lb. Body mass index: BMI: 39.8 kg/m^2. Body surface area: BSA: 2.44 m^2. Blood pressure: 100/70 Patient status: Outpatient. Study date: Study date: 06/20/2014. Study time: 12:46 PM. Location:  [facility]  -------------------------------------------------------------------  ------------------------------------------------------------------- Left ventricle: Systolic function was severely reduced.  The estimated ejection fraction was in the range of 25% to 30%.  ------------------------------------------------------------------- Right atrium: The atrium was normal in size.  ------------------------------------------------------------------- Pericardium: There is a large pericardial effusion. there is no collapse of the RA or RV. A moderate to large, free-flowing pericardial effusion was identified. There was no evidence of hemodynamic compromise.  ------------------------------------------------------------------- Prepared and Electronically Authenticated  by  Mertie Moores, M.D. 2015-07-07T18:39:28    Impression:   He has a moderate to large pericardial effusion following Bentall and ascending aortic replacement and is on coumadin for a mechanical valve. There is some stranding in the fluid suggesting that it is hemorrhagic. While there are no signs of tamponade on his recent echo I think it is enough fluid to cause exertional dyspnea especially with a reduced EF of 30%. If this is hemorrhagic it will likely continue to enlarge. I think it is best to drain this by subxyphoid pericardial window. I discussed the operative procedure with the patient and his significant other including alternatives, benefits and risks; including but not limited to bleeding, blood transfusion, infection, recurrence of the effusion. Linnie Mcglocklin Poke understands and agrees to proceed. We will schedule surgery for tomorrow.   Plan:   Subxyphoid pericardial window tomorrow.

## 2014-06-27 NOTE — Progress Notes (Signed)
CT surgery PM Rounds  Patient resting comfortably after subxiphoid pericardial window day Stable blood pressure 100 cc inPleur-evac Sinus rhythm

## 2014-06-27 NOTE — Brief Op Note (Signed)
06/27/2014  1:33 PM  PATIENT:  Alec Kelly  48 y.o. male  PRE-OPERATIVE DIAGNOSIS:  PERICARDIAL EFFUSION  POST-OPERATIVE DIAGNOSIS:  PERICARDIAL EFFUSION  PROCEDURE:  Procedure(s): SUBXYPHOID PERICARDIAL WINDOW (N/A) INTRAOPERATIVE TRANSESOPHAGEAL ECHOCARDIOGRAM (N/A)  SURGEON:  Surgeon(s) and Role: Panel 1:    * Gaye Pollack, MD - Primary  Panel 2:    * Gaye Pollack, MD - Primary  PHYSICIAN ASSISTANT: none    ANESTHESIA:   general  EBL:     BLOOD ADMINISTERED:none  DRAINS: (1 61 F) Blake drain(s) in the posterior pericardial space   LOCAL MEDICATIONS USED:  NONE  SPECIMEN:  No Specimen  DISPOSITION OF SPECIMEN:  N/A  COUNTS:  YES  TOURNIQUET:  * No tourniquets in log *  DICTATION: .Note written in EPIC  PLAN OF CARE: Admit to inpatient   PATIENT DISPOSITION:  PACU - hemodynamically stable.   Delay start of Pharmacological VTE agent (>24hrs) due to surgical blood loss or risk of bleeding: no

## 2014-06-27 NOTE — Anesthesia Postprocedure Evaluation (Signed)
Anesthesia Post Note  Patient: Alec Kelly  Procedure(s) Performed: Procedure(s) (LRB): SUBXYPHOID PERICARDIAL WINDOW (N/A) INTRAOPERATIVE TRANSESOPHAGEAL ECHOCARDIOGRAM (N/A)  Anesthesia type: general  Patient location: PACU  Post pain: Pain level controlled  Post assessment: Patient's Cardiovascular Status Stable  Last Vitals:  Filed Vitals:   06/27/14 1445  BP:   Pulse: 94  Temp: 36.4 C  Resp: 18    Post vital signs: Reviewed and stable  Level of consciousness: sedated  Complications: No apparent anesthesia complications

## 2014-06-27 NOTE — Op Note (Signed)
CARDIOVASCULAR SURGERY OPERATIVE NOTE  06/27/2014  Surgeon:  Gaye Pollack, MD  First Assistant: none   Preoperative Diagnosis:  Moderate to large pericardial effusion s/p Bentall procedure and replacement of ascending aorta on 06/02/2014.  Postoperative Diagnosis:  Small pericardial effusion  Procedure:  Subxyphoid pericardial window   Anesthesia:  General Endotracheal   Clinical History/Surgical Indication:  The patient is a 48 year old gentleman who underwent Bentall procedure using a 25 mm St. Jude valved graft and replacement of an ascending aortic aneurysm under circulatory arrest on 06/02/2014. His postop course was complicated only by a gout flare in his left foot and knee just prior to discharge. He was started on coumadin postop and initially required a higher dose to bump in the 7.5-10 mg range. He had an echo on 06/12/2014 to evaluate for a pericardial effusion and this showed a moderate sized effusion with greater than 30% respiratory variation of the mitral inflow velocity raising the possibility of early hemodynamic compromise. There was no diastolic collapse of the RA or RV. He was discharge home and has done reasonably well but continues to have some exertional dyspnea. His significant other says he gets out walking with her every day and is " breathing like he just ran a marathon". He had a follow up echo on 06/20/2014 that still showed the effusion and cardiology felt that it was unchanged with no evidence of hemodynamic compromise. My office called him this am to check on him and he was still having exertional dyspnea and some dizziness so we asked him to come in for evaluation. His last INR was 1.5 on 06/20/2014. It was 2.4 on 06/15/2014. His coumadin dose has been increased to 5 mg daily except 7.5 on Tuesday and Saturday. He has a moderate to large pericardial effusion following Bentall  and ascending aortic replacement and is on coumadin for a mechanical valve. There is some stranding in the fluid suggesting that it is hemorrhagic. While there are no signs of tamponade on his recent echo I think it is enough fluid to cause exertional dyspnea especially with a reduced EF of 30%. If this is hemorrhagic it will likely continue to enlarge. I think it is best to drain this by subxyphoid pericardial window. I discussed the operative procedure with the patient and his significant other including alternatives, benefits and risks; including but not limited to bleeding, blood transfusion, infection, recurrence of the effusion. Alec Kelly understands and agrees to proceed.     Preparation:  The patient was seen in the preoperative holding area and the correct patient, correct operation were confirmed with the patient after reviewing the medical record and catheterization. The consent was signed by me. Preoperative antibiotics were given.  The patient was taken back to the operating room and positioned supine on the operating room table. After being placed under general endotracheal anesthesia by the anesthesia team a foley catheter was placed. The neck, chest, and abdomen were prepped with betadine soap and solution and draped in the usual sterile manner. A surgical time-out was taken and the correct patient and operative procedure were confirmed with the nursing and anesthesia staff.   TEE:  Performed by Dr. Lillia Abed. This showed a small to moderate pericardial effusion primarily located laterally and posteriorly. There were no signs of tamponade. EF was about 30% with a normally functioning prosthetic aortic valve.   Subxyphoid pericardial window:  A 4 cm incision was made over the xyphoid process and carried down through the  subcutaneous tissue using cautery until the midline fascia was encountered. The fascia was divided in the midline and the subxyphoid space entered. The anterior  pericardium was visualized just at the diaphragm and it was opened with cautery. The pericardial space was entered and about 50-100 cc of old bloody fluid was removed posteriorly and laterally. TEE confirmed that all of the fluid was removed. A 32 F Blake drain was placed in the inferior pericardial space through a separate small incision. Hemostasis was complete. The midline fascia was approximated with interrupted #0 vicryl sutures. The subcutaneous tissue was approximated with continuous 2-0 vicryl suture and the skin with 3-0 vicryl subcuticular suture. The sponge, needle, and instrument counts were correct according to the nurses. Dermabond was applied to the incision. The patient was awakened, extubated, and transported to the PACU in stable condition.

## 2014-06-27 NOTE — Transfer of Care (Signed)
Immediate Anesthesia Transfer of Care Note  Patient: Alec Kelly  Procedure(s) Performed: Procedure(s): SUBXYPHOID PERICARDIAL WINDOW (N/A) INTRAOPERATIVE TRANSESOPHAGEAL ECHOCARDIOGRAM (N/A)  Patient Location: PACU  Anesthesia Type:General  Level of Consciousness: awake, alert  and oriented  Airway & Oxygen Therapy: Patient Spontanous Breathing and Patient connected to face mask oxygen  Post-op Assessment: Report given to PACU RN and Post -op Vital signs reviewed and stable  Post vital signs: Reviewed and stable  Complications: No apparent anesthesia complications

## 2014-06-27 NOTE — Interval H&P Note (Signed)
History and Physical Interval Note:  06/27/2014 11:51 AM  Alec Kelly  has presented today for surgery, with the diagnosis of PERICARDIAL EFFUSION  The various methods of treatment have been discussed with the patient and family. After consideration of risks, benefits and other options for treatment, the patient has consented to  Procedure(s): SUBXYPHOID PERICARDIAL WINDOW (N/A) INTRAOPERATIVE TRANSESOPHAGEAL ECHOCARDIOGRAM (N/A) as a surgical intervention .  The patient's history has been reviewed, patient examined, no change in status, stable for surgery.  I have reviewed the patient's chart and labs.  Questions were answered to the patient's satisfaction.     Gaye Pollack

## 2014-06-27 NOTE — Anesthesia Preprocedure Evaluation (Signed)
Anesthesia Evaluation  Patient identified by MRN, date of birth, ID band Patient awake    Reviewed: Allergy & Precautions, H&P , NPO status , Patient's Chart, lab work & pertinent test results  Airway Mallampati: I TM Distance: >3 FB Neck ROM: Full    Dental   Pulmonary shortness of breath,          Cardiovascular     Neuro/Psych Anxiety    GI/Hepatic GERD-  Medicated and Controlled,  Endo/Other    Renal/GU      Musculoskeletal   Abdominal   Peds  Hematology   Anesthesia Other Findings   Reproductive/Obstetrics                           Anesthesia Physical Anesthesia Plan  ASA: III  Anesthesia Plan: General   Post-op Pain Management:    Induction: Intravenous  Airway Management Planned: Oral ETT  Additional Equipment: Arterial line  Intra-op Plan:   Post-operative Plan: Extubation in OR  Informed Consent: I have reviewed the patients History and Physical, chart, labs and discussed the procedure including the risks, benefits and alternatives for the proposed anesthesia with the patient or authorized representative who has indicated his/her understanding and acceptance.     Plan Discussed with: CRNA and Surgeon  Anesthesia Plan Comments:         Anesthesia Quick Evaluation

## 2014-06-27 NOTE — Progress Notes (Signed)
ANTICOAGULATION CONSULT NOTE - Initial Consult  Pharmacy Consult for warfarin Indication: mechanical valve  Allergies  Allergen Reactions  . Ivp Dye [Iodinated Diagnostic Agents] Nausea And Vomiting    ORAL LIQUID   . Morphine And Related   . Shellfish-Derived Products     exacerbates gout    Patient Measurements: Height: 5\' 8"  (172.7 cm) Weight: 261 lb (118.389 kg) IBW/kg (Calculated) : 68.4  Vital Signs: Temp: 97.6 F (36.4 C) (07/14 1445) Temp src: Oral (07/14 1114) BP: 110/68 mmHg (07/14 1430) Pulse Rate: 94 (07/14 1445)  Labs:  Recent Labs  06/27/14 1059  HGB 11.8*  HCT 37.1*  PLT 497*  APTT 41*  LABPROT 20.6*  INR 1.77*  CREATININE 1.00    Estimated Creatinine Clearance: 113 ml/min (by C-G formula based on Cr of 1).   Medical History: Past Medical History  Diagnosis Date  . Diverticulitis   . Diverticulitis of sigmoid colon 04/07/2013    With perforation  . Obesity, Class III, BMI 40-49.9 (morbid obesity) 04/07/2013  . 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 Aortic arch aneurysm 2015  . Aortic stenosis 2015    s/p Bentall with mechanical (St Jude AVR) 05/2014  . Personal history of kidney stones   . GERD (gastroesophageal reflux disease)   . Claustrophobia     does not like anything put over his face  . NICM (nonischemic cardiomyopathy)     Echo (06/12/14):  Moderate LVH, EF 30-35%, mechanical AVR ok (mean 11 mm Hg), mild LAE, mildly reduced RVSF, mild to moderate RAE, moderate pericardial effusion  . Carotid stenosis     Carotid US (05/31/14):  Bilateral ICA 1-39%  . Hx of cardiac cath     a. LHC (05/15/14):  Ostial circumflex 20%, EF 25-30%    Medications:  Prescriptions prior to admission  Medication Sig Dispense Refill  . acetaminophen (TYLENOL) 500 MG tablet Take 500-2,000 mg by mouth every 6 (six) hours as needed.      Marland Kitchen allopurinol (ZYLOPRIM) 100 MG tablet Take 1 tablet (100 mg total) by mouth daily as needed (for gout).  30 tablet  0  .  aspirin EC 81 MG EC tablet Take 1 tablet (81 mg total) by mouth daily.      . metoprolol tartrate (LOPRESSOR) 25 MG tablet Take 12.5 mg by mouth 2 (two) times daily.      Marland Kitchen oxyCODONE (OXY IR/ROXICODONE) 5 MG immediate release tablet Take 1-2 tablets (5-10 mg total) by mouth every 4 (four) hours as needed for severe pain.  30 tablet  0  . warfarin (COUMADIN) 5 MG tablet Take 5 mg by mouth daily at 6 PM. Takes 7.5 mg on Tuesday and Saturday and 5 mg all other days      . [DISCONTINUED] metoprolol tartrate (LOPRESSOR) 25 MG tablet Take 0.5 tablets (12.5 mg total) by mouth 2 (two) times daily.  60 tablet  1  . [DISCONTINUED] warfarin (COUMADIN) 5 MG tablet Take 1 tablet (5 mg total) by mouth daily at 6 PM.  30 tablet  1    Assessment: 48 year old man s/p subxyphoid pericardial window 7/14 who is on warfarin prior to admission for mechanical valve.  Home dose is 5mg  daily except 7.5mg  on Tuesday and Saturday.  Last dose was taken yesterday evening.  INR today is 1.77. Goal of Therapy:  INR 2-3   Plan:  Warfarin 7.5mg  po x 1 dose today Daily protimes  Candie Mile 06/27/2014,4:04 PM

## 2014-06-28 ENCOUNTER — Other Ambulatory Visit: Payer: Self-pay

## 2014-06-28 ENCOUNTER — Inpatient Hospital Stay (HOSPITAL_COMMUNITY): Payer: Self-pay

## 2014-06-28 ENCOUNTER — Ambulatory Visit: Payer: Self-pay | Admitting: Surgery

## 2014-06-28 LAB — CBC
HCT: 33 % — ABNORMAL LOW (ref 39.0–52.0)
HEMOGLOBIN: 10.3 g/dL — AB (ref 13.0–17.0)
MCH: 28.3 pg (ref 26.0–34.0)
MCHC: 31.2 g/dL (ref 30.0–36.0)
MCV: 90.7 fL (ref 78.0–100.0)
PLATELETS: 420 10*3/uL — AB (ref 150–400)
RBC: 3.64 MIL/uL — AB (ref 4.22–5.81)
RDW: 13.3 % (ref 11.5–15.5)
WBC: 7.3 10*3/uL (ref 4.0–10.5)

## 2014-06-28 LAB — GLUCOSE, CAPILLARY
GLUCOSE-CAPILLARY: 111 mg/dL — AB (ref 70–99)
Glucose-Capillary: 96 mg/dL (ref 70–99)
Glucose-Capillary: 94 mg/dL (ref 70–99)
Glucose-Capillary: 123 mg/dL — ABNORMAL HIGH (ref 70–99)
Glucose-Capillary: 105 mg/dL — ABNORMAL HIGH (ref 70–99)

## 2014-06-28 LAB — BASIC METABOLIC PANEL
ANION GAP: 14 (ref 5–15)
BUN: 11 mg/dL (ref 6–23)
CALCIUM: 8.7 mg/dL (ref 8.4–10.5)
CHLORIDE: 99 meq/L (ref 96–112)
CO2: 25 meq/L (ref 19–32)
Creatinine, Ser: 0.95 mg/dL (ref 0.50–1.35)
GFR calc Af Amer: >90 mL/min (ref >90)
GFR calc non Af Amer: >90 mL/min (ref >90)
Glucose, Bld: 97 mg/dL (ref 70–99)
POTASSIUM: 4.5 meq/L (ref 3.7–5.3)
SODIUM: 138 meq/L (ref 137–147)

## 2014-06-28 LAB — PROTIME-INR
INR: 1.87 — AB (ref 0.00–1.49)
Prothrombin Time: 21.5 seconds — ABNORMAL HIGH (ref 11.6–15.2)

## 2014-06-28 MED ORDER — PANTOPRAZOLE SODIUM 40 MG PO TBEC
40.0000 mg | DELAYED_RELEASE_TABLET | Freq: Every day | ORAL | Status: DC
  Administered 2014-06-29 – 2014-07-02 (×4): 40 mg via ORAL
  Filled 2014-06-28 (×4): qty 1

## 2014-06-28 MED ORDER — SODIUM CHLORIDE 0.9 % IJ SOLN
3.0000 mL | Freq: Two times a day (BID) | INTRAMUSCULAR | Status: DC
  Administered 2014-06-28 – 2014-06-30 (×3): 3 mL via INTRAVENOUS
  Administered 2014-06-30: 10:00:00 via INTRAVENOUS
  Administered 2014-07-01 (×2): 3 mL via INTRAVENOUS

## 2014-06-28 MED ORDER — SODIUM CHLORIDE 0.9 % IJ SOLN
3.0000 mL | INTRAMUSCULAR | Status: DC | PRN
Start: 2014-06-28 — End: 2014-07-02

## 2014-06-28 MED ORDER — ACETAMINOPHEN 325 MG PO TABS
650.0000 mg | ORAL_TABLET | Freq: Four times a day (QID) | ORAL | Status: DC | PRN
  Administered 2014-06-28 (×2): 650 mg via ORAL
  Filled 2014-06-28: qty 2

## 2014-06-28 MED ORDER — BISACODYL 10 MG RE SUPP: 10.0000 mg | Freq: Every day | RECTAL | Status: DC | PRN

## 2014-06-28 MED ORDER — SODIUM CHLORIDE 0.9 % IV SOLN: 250.0000 mL | INTRAVENOUS | Status: DC | PRN

## 2014-06-28 MED ORDER — MOVING RIGHT ALONG BOOK
Freq: Once | Status: AC
  Administered 2014-06-28: 14:00:00
  Filled 2014-06-28: qty 1

## 2014-06-28 MED ORDER — OXYCODONE HCL 5 MG PO TABS
5.0000 mg | ORAL_TABLET | ORAL | Status: DC | PRN
  Administered 2014-06-29 – 2014-07-01 (×11): 10 mg via ORAL
  Filled 2014-06-28 (×11): qty 2

## 2014-06-28 MED ORDER — DOCUSATE SODIUM 100 MG PO CAPS
200.0000 mg | ORAL_CAPSULE | Freq: Every day | ORAL | Status: DC
  Administered 2014-06-29 – 2014-07-01 (×3): 200 mg via ORAL
  Filled 2014-06-28 (×4): qty 2

## 2014-06-28 MED ORDER — INSULIN ASPART 100 UNIT/ML ~~LOC~~ SOLN
0.0000 [IU] | Freq: Three times a day (TID) | SUBCUTANEOUS | Status: DC
  Administered 2014-06-28 – 2014-07-02 (×7): 2 [IU] via SUBCUTANEOUS

## 2014-06-28 MED ORDER — WARFARIN SODIUM 7.5 MG PO TABS
7.5000 mg | ORAL_TABLET | Freq: Once | ORAL | Status: AC
  Administered 2014-06-28: 7.5 mg via ORAL
  Filled 2014-06-28: qty 1

## 2014-06-28 MED ORDER — METOPROLOL TARTRATE 12.5 MG HALF TABLET
12.5000 mg | ORAL_TABLET | Freq: Two times a day (BID) | ORAL | Status: DC
  Administered 2014-06-28 – 2014-07-01 (×8): 12.5 mg via ORAL
  Filled 2014-06-28 (×10): qty 1

## 2014-06-28 MED ORDER — BISACODYL 5 MG PO TBEC
10.0000 mg | DELAYED_RELEASE_TABLET | Freq: Every day | ORAL | Status: DC | PRN
  Filled 2014-06-28: qty 2

## 2014-06-28 NOTE — Progress Notes (Signed)
1 Day Post-Op Procedure(s) (LRB): SUBXYPHOID PERICARDIAL WINDOW (N/A) INTRAOPERATIVE TRANSESOPHAGEAL ECHOCARDIOGRAM (N/A) Subjective: No complaints  Nurses note desat into 60's on room air while sleeping. 90% now  Objective: Vital signs in last 24 hours: Temp:  [97.6 F (36.4 C)-99 F (37.2 C)] 98.1 F (36.7 C) (07/15 0831) Pulse Rate:  [81-102] 98 (07/15 0700) Cardiac Rhythm:  [-] Normal sinus rhythm (07/15 0745) Resp:  [15-27] 20 (07/15 0700) BP: (99-131)/(60-80) 121/75 mmHg (07/15 0700) SpO2:  [90 %-98 %] 90 % (07/15 0700) Arterial Line BP: (79-147)/(58-89) 134/72 mmHg (07/15 0700) Weight:  [118.389 kg (261 lb)] 118.389 kg (261 lb) (07/14 1033)  Hemodynamic parameters for last 24 hours:    Intake/Output from previous day: 07/14 0701 - 07/15 0700 In: 2385 [P.O.:60; I.V.:2225; IV Piggyback:100] Out: 5784 [Urine:1405; Chest Tube:130] Intake/Output this shift:    General appearance: alert and cooperative Neurologic: intact Heart: regular rate and rhythm, S1, S2 normal, no murmur, crisp valve sounds Lungs: clear to auscultation bilaterally Extremities: extremities normal, atraumatic, no cyanosis or edema Wound: dressing dry Minimal drainage from pericardial tube.  Lab Results:  Recent Labs  06/27/14 1059 06/28/14 0339  WBC 7.0 7.3  HGB 11.8* 10.3*  HCT 37.1* 33.0*  PLT 497* 420*   BMET:  Recent Labs  06/27/14 1059 06/28/14 0339  NA 139 138  K 4.5 4.5  CL 101 99  CO2 23 25  GLUCOSE 102* 97  BUN 13 11  CREATININE 1.00 0.95  CALCIUM 9.1 8.7    PT/INR:  Recent Labs  06/28/14 0339  LABPROT 21.5*  INR 1.87*   ABG    Component Value Date/Time   PHART 7.426 06/27/2014 1059   HCO3 24.9* 06/27/2014 1059   TCO2 26.1 06/27/2014 1059   ACIDBASEDEF 4.0* 06/03/2014 0417   O2SAT 96.5 06/27/2014 1059   CBG (last 3)   Recent Labs  06/27/14 2307 06/28/14 0317 06/28/14 0830  GLUCAP 99 96 94   CXR: ok  Assessment/Plan: S/P Procedure(s)  (LRB): SUBXYPHOID PERICARDIAL WINDOW (N/A) INTRAOPERATIVE TRANSESOPHAGEAL ECHOCARDIOGRAM (N/A)  He is hemodynamically stable  Remove pericardial tube and A-line, foley.  Continue coumadin for mechanical valve.  Transfer to 2W.   LOS: 1 day    BARTLE,BRYAN K 06/28/2014

## 2014-06-28 NOTE — Progress Notes (Signed)
Transferred to 2W08 via wheelchair. Portable monitor on.  No changes.Report given to [facility], RN

## 2014-06-28 NOTE — Progress Notes (Signed)
Attempted to ambulate, pt reported swelling and pain in hist left knee. Will try again later.

## 2014-06-28 NOTE — Progress Notes (Signed)
Utilization Review Completed.Donne Anon T7/15/2015

## 2014-06-28 NOTE — Progress Notes (Signed)
[place] for warfarin Indication: mechanical valve  Allergies  Allergen Reactions  . Ivp Dye [Iodinated Diagnostic Agents] Nausea And Vomiting    ORAL LIQUID   . Morphine And Related   . Shellfish-Derived Products     exacerbates gout    Labs:  Recent Labs  06/27/14 1059 06/28/14 0339  HGB 11.8* 10.3*  HCT 37.1* 33.0*  PLT 497* 420*  APTT 41*  --   LABPROT 20.6* 21.5*  INR 1.77* 1.87*  CREATININE 1.00 0.95    Estimated Creatinine Clearance: 118.9 ml/min (by C-G formula based on Cr of 0.95).   Assessment: 48 year old man s/p subxyphoid pericardial window 7/14 who is on warfarin prior to admission for mechanical valve.  Home dose is 5mg  daily except 7.5mg  on Tuesday and Saturday.  Last dose was taken yesterday evening.    INR today is 1.87  Goal of Therapy:  INR 2-3   Plan:  Repeat Warfarin 7.5mg  po x 1 dose today Daily protimes  Thank you Anette Guarneri, PharmD [phone]  06/28/2014,11:02 AM

## 2014-06-29 LAB — PROTIME-INR
INR: 2.28 — ABNORMAL HIGH (ref 0.00–1.49)
PROTHROMBIN TIME: 25.1 s — AB (ref 11.6–15.2)

## 2014-06-29 LAB — GLUCOSE, CAPILLARY
GLUCOSE-CAPILLARY: 100 mg/dL — AB (ref 70–99)
GLUCOSE-CAPILLARY: 113 mg/dL — AB (ref 70–99)
Glucose-Capillary: 103 mg/dL — ABNORMAL HIGH (ref 70–99)
Glucose-Capillary: 156 mg/dL — ABNORMAL HIGH (ref 70–99)

## 2014-06-29 MED ORDER — WARFARIN SODIUM 5 MG PO TABS
5.0000 mg | ORAL_TABLET | Freq: Once | ORAL | Status: AC
  Administered 2014-06-29: 5 mg via ORAL
  Filled 2014-06-29: qty 1

## 2014-06-29 MED ORDER — COLCHICINE 0.6 MG PO TABS
0.6000 mg | ORAL_TABLET | Freq: Three times a day (TID) | ORAL | Status: DC | PRN
  Administered 2014-06-29 – 2014-06-30 (×3): 0.6 mg via ORAL
  Filled 2014-06-29 (×4): qty 1

## 2014-06-29 MED ORDER — COLCHICINE 0.6 MG PO TABS: 0.6000 mg | ORAL_TABLET | Freq: Three times a day (TID) | ORAL | Status: DC

## 2014-06-29 NOTE — Progress Notes (Addendum)
      [address]       [place] [address]             [phone]      2 Days Post-Op Procedure(s) (LRB): SUBXYPHOID PERICARDIAL WINDOW (N/A) INTRAOPERATIVE TRANSESOPHAGEAL ECHOCARDIOGRAM (N/A)  Subjective:  Mr. Loescher complains of gout pain.  He is requesting colchicine.  He is refusing to ambulate because of gout.  I explained to the patient that I am happy to place an order for colchicine.  However, he must ambulate 3 times per day for as far as he can tolerate, but he is not to sit in bed all day long.   Objective: Vital signs in last 24 hours: Temp:  [97.9 F (36.6 C)-100.1 F (37.8 C)] 97.9 F (36.6 C) (07/16 0629) Pulse Rate:  [96-116] 106 (07/16 0629) Cardiac Rhythm:  [-] Normal sinus rhythm (07/15 2048) Resp:  [16-20] 18 (07/16 0629) BP: (103-121)/(64-80) 117/80 mmHg (07/16 0629) SpO2:  [90 %-100 %] 95 % (07/16 0629) Arterial Line BP: (108-111)/(65-67) 108/67 mmHg (07/15 1000) Weight:  [262 lb 5.6 oz (119 kg)] 262 lb 5.6 oz (119 kg) (07/16 0629)  Intake/Output from previous day: 07/15 0701 - 07/16 0700 In: 480 [P.O.:480] Out: 450 [Urine:450]  General appearance: alert, cooperative and no distress Heart: regular rate and rhythm Lungs: clear to auscultation bilaterally Abdomen: soft, non-tender; bowel sounds normal; no masses,  no organomegaly Wound: clean and dry  Lab Results:  Recent Labs  06/27/14 1059 06/28/14 0339  WBC 7.0 7.3  HGB 11.8* 10.3*  HCT 37.1* 33.0*  PLT 497* 420*   BMET:  Recent Labs  06/27/14 1059 06/28/14 0339  NA 139 138  K 4.5 4.5  CL 101 99  CO2 23 25  GLUCOSE 102* 97  BUN 13 11  CREATININE 1.00 0.95  CALCIUM 9.1 8.7    PT/INR:  Recent Labs  06/29/14 0532  LABPROT 25.1*  INR 2.28*   ABG    Component Value Date/Time   PHART 7.426 06/27/2014 1059   HCO3 24.9* 06/27/2014 1059   TCO2 26.1 06/27/2014 1059   ACIDBASEDEF 4.0* 06/03/2014 0417   O2SAT 96.5 06/27/2014 1059   CBG (last 3)   Recent  Labs  06/28/14 1638 06/28/14 2110 06/29/14 0629  GLUCAP 123* 105* 103*    Assessment/Plan: S/P Procedure(s) (LRB): SUBXYPHOID PERICARDIAL WINDOW (N/A) INTRAOPERATIVE TRANSESOPHAGEAL ECHOCARDIOGRAM (N/A)  1.CV- Tachy- will increase Lopressor to 25 mg BID 2. INR 2.28- will continue Coumadin at 5 mg daily 3. Pulm- no acute issues, continue IS 4. Gout- add colchcine TID prn, Allupurinol 5. Deconditioning- Patient needs to get out of bed 6. Dispo- patient stable, INR therapeutic, patient needs to ambulate, possibly d/c in AM   LOS: 2 days    Ellwood Handler 06/29/2014   Chart reviewed, patient examined, agree with above.

## 2014-06-29 NOTE — Progress Notes (Signed)
[id number] Came to see pt to walk. Pt declined said he was not going anywhere. C/o right arm hurting like a toothache. Had gotten pain med about 15 minutes ago. Pt stated he would not be able to hold to walker. Told him I could use gait belt but again he declined. Put right arm on pillow and turned heat up and gave pt blanket for comfort. Will return as time permits. Pt stated he might walk later. Graylon Good RN BSN 06/29/2014 11:52 AM

## 2014-06-29 NOTE — Progress Notes (Signed)
[id number] Came to get pt to ambulate. Pt still states he cannot walk. Stated he could bear weight on legs but that any movement makes right arm hurt. Stated he would not be able to use walker. Told pt I could use gait belt and help him that way. Encouraged IS and pt could get 1750-1800 ml. Able to do seven times well. Will follow up tomorrow. Graylon Good RN BSN 06/29/2014 2:10 PM

## 2014-06-29 NOTE — Progress Notes (Signed)
[place] for warfarin Indication: mechanical valve  Allergies  Allergen Reactions  . Ivp Dye [Iodinated Diagnostic Agents] Nausea And Vomiting    ORAL LIQUID   . Morphine And Related   . Shellfish-Derived Products     exacerbates gout    Labs:  Recent Labs  06/27/14 1059 06/28/14 0339 06/29/14 0532  HGB 11.8* 10.3*  --   HCT 37.1* 33.0*  --   PLT 497* 420*  --   APTT 41*  --   --   LABPROT 20.6* 21.5* 25.1*  INR 1.77* 1.87* 2.28*  CREATININE 1.00 0.95  --     Estimated Creatinine Clearance: 119.2 ml/min (by C-G formula based on Cr of 0.95).   Assessment: 48 year old man s/p subxyphoid pericardial window 7/14 who is on warfarin prior to admission for mechanical valve.  INR 2.28 this morning.   Home dose is 5mg  daily except 7.5mg  on Tuesday and Saturday.   Goal of Therapy:  INR 2-3 (per coumadin clinic note)   Plan:  Repeat Warfarin 5mg  po x 1 dose today as ordered  Daily protimes  Maryanna Shape, PharmD, BCPS  Clinical Pharmacist  Pager: [phone]  06/29/2014,12:55 PM

## 2014-06-29 NOTE — Progress Notes (Signed)
Explained to patient the need to ambulate, patient flatly refused due to gout pain. Medicated patient and encouraged him to ambulate with our assistance when his pain begins to subside. Patient will not ambulate tonight, will try tomorrow.

## 2014-06-29 NOTE — Discharge Summary (Signed)
Physician Discharge Summary  Patient ID: Alec Kelly MRN: [id number] DOB/AGE: 1966/06/04 48 y.o.  Admit date: 06/27/2014 Discharge date: 06/29/2014  Admission Diagnoses:  Patient Active Problem List   Diagnosis Date Noted  . Hemorrhagic pericardial effusion 06/27/2014  . Pericardial effusion 06/26/2014  . 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 Thoracic ascending aortic aneurysm 06/02/2014  . Aortic aneurysm 05/18/2014  . Aortic stenosis 05/18/2014  . Aortic valve disease 04/28/2014  . LBBB (left bundle branch block) 04/28/2014  . SOB (shortness of breath) 04/28/2014  . Sigmoid diverticulitis 03/15/2014  . Acute diverticulitis 03/15/2014  . LLQ abdominal pain 03/15/2014  . Leukocytosis 03/15/2014  . Gout 03/15/2014  . Blood in stool 05/13/2013  . Diverticulitis of colon with perforation 04/07/2013  . Obesity, Class III, BMI 40-49.9 (morbid obesity) 04/07/2013  . Esophageal thickening 04/07/2013  . Idiopathic gout of knee 04/06/2013   Discharge Diagnoses:   Patient Active Problem List   Diagnosis Date Noted  . Hemorrhagic pericardial effusion 06/27/2014  . Pericardial effusion 06/26/2014  . 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 Thoracic ascending aortic aneurysm 06/02/2014  . Aortic aneurysm 05/18/2014  . Aortic stenosis 05/18/2014  . Aortic valve disease 04/28/2014  . LBBB (left bundle branch block) 04/28/2014  . SOB (shortness of breath) 04/28/2014  . Sigmoid diverticulitis 03/15/2014  . Acute diverticulitis 03/15/2014  . LLQ abdominal pain 03/15/2014  . Leukocytosis 03/15/2014  . Gout 03/15/2014  . Blood in stool 05/13/2013  . Diverticulitis of colon with perforation 04/07/2013  . Obesity, Class III, BMI 40-49.9 (morbid obesity) 04/07/2013  . Esophageal thickening 04/07/2013  . Idiopathic gout of knee 04/06/2013   Discharged Condition: good  History of Present Illness:   Alec Kelly is a 48 yo morbidly obese white male well known to  [facility].  He is S/P [place] Procedure performed by Dr. Cyndia Bent on 06/02/2014.  His hospital course was complicated by Gout and a UTI, both of which were treated and the patient was discharged home on 06/13/2014.  The patient initially had an Echocardiogram on 06/12/2014 which showed a moderate size pericardial effusion.  He initially did well out home.  However, per his significant other when the patient ambulates he appears winded as though he has run a marathon.  Repeat Echocardiogram was obtained on 06/20/2014 and showed no change in the effusion or evidence of hemodynamic compromise.  However, [facility] contacted the patient to check on him at which time he continued to complain of exertional shortness of breath and dizziness.  He was brought to the office for further evaluation.  His INR was in a normal therapeutic range.  He was evaluated by Dr. Cyndia Bent on 06/26/2014.  During that visit after review of the Echocardiogram it was felt his effusion is likely hemorrhagic and would continue to increase in size.  The patient has a reduced EF of 30% and it was felt the effusion is most likely the source of his dyspnea.  It was recommended the patient undergo a Pericardial Window.  The risks and benefits of the procedure were explained to the patient and he was agreeable to proceed.  Hospital Course:   The patient presented to [facility] on 06/27/2014.  He was taken to the operating room and underwent Subxiphoid Pericardial window. He tolerated the procedure well, was extubated and taken to the SICU in stable condition.  The patient has done well post operatively.  He has been restarted on his home regimen of Coumadin.  His INR is therapeutic at 2.28.  His pericardial drain had minimal output post operatively and was subsequently removed.  The patient was transferred to the telemetry floor for further care.  His is maintaining NSR, however his rate is tachy in the 100s.  Therefore, his lopressor dose was increased.  He  again complains of gout flare in his left leg.  He was started on Colchicine , Allopurinol, and prednisone for which he got good relief. .  He again was refusing to ambulate due to the pain and the patient was instructed that he must get out of bed at least 3x per day.  Should he be able to ambulate and continues to progress without difficulty, we anticipate discharge home in the next 24-48 hours.  Significant Diagnostic Studies: cardiac graphics: Echocardiogram:   - Left ventricle: Systolic function was severely reduced. The estimated ejection fraction was in the range of 25% to 30%. - Pericardium, extracardiac: A moderate to large, free-flowing pericardial effusion was identified. There was no evidence of hemodynamic compromise.  Treatments: surgery:   Subxyphoid pericardial window  Disposition: 01-Home or Self Care  Discharge Medications:       Medication List         acetaminophen 500 MG tablet  Commonly known as:  TYLENOL  Take 500-2,000 mg by mouth every 6 (six) hours as needed.     allopurinol 100 MG tablet  Commonly known as:  ZYLOPRIM  Take 1 tablet (100 mg total) by mouth daily as needed (for gout).     aspirin 81 MG EC tablet  Take 1 tablet (81 mg total) by mouth daily.     metoprolol tartrate 25 MG tablet  Commonly known as:  LOPRESSOR  Take 12.5 mg by mouth 2 (two) times daily.     oxyCODONE 5 MG immediate release tablet  Commonly known as:  Oxy IR/ROXICODONE  Take 1-2 tablets (5-10 mg total) by mouth every 4 (four) hours as needed for severe pain.     predniSONE 5 MG tablet  Commonly known as:  DELTASONE  Take 20 mg daily for 3 days, followed by 10 mg daily for 3 days, followed by 5 mg daily for 3 days     warfarin 2.5 MG tablet  Commonly known as:  COUMADIN  Take 1 tablet (2.5 mg total) by mouth daily at 6 PM. Takes 7.5 mg on Tuesday and Saturday and 5 mg all other days         Follow-up Information   Follow up with Gaye Pollack, MD On 07/19/2014.  (Appointment is at 12:00)    Specialty:  Cardiothoracic Surgery   Contact information:   [address] [place] [address] [phone]       Follow up with [facility] On 07/19/2014. (Please get CXR at 11:00)    Contact information:   [place]       Signed: BARRETT, ERIN 06/29/2014, 9:42 AM

## 2014-06-29 NOTE — Discharge Instructions (Signed)
Incision Care  An incision is a surgical cut to open your skin. You need to take care of your incision. This helps you to not get an infection. HOME CARE  Only take medicine as told by your doctor.  Do not take off your bandage (dressing) or get your incision wet until your doctor approves. Change the bandage and call your doctor if the bandage gets wet, dirty, or starts to smell.  Take showers. Do not take baths, swim, or do anything that may soak your incision until it heals.  Return to your normal diet and activities as told or allowed by your doctor.  Avoid lifting any weight until your doctor approves.  Put medicine that helps lessen itching on your incision as told by your doctor. Do not pick or scratch at your incision.  Keep your doctor visit to have your stitches (sutures) or staples removed.  Drink enough fluids to keep your pee (urine) clear or pale yellow. GET HELP RIGHT AWAY IF:  You have a fever.  You have a rash.  You are dizzy, or you pass out (faint) while standing.  You have trouble breathing.  You have a reaction or side effects to medicine given to you.  You have redness, puffiness (swelling), or more pain in the incision and medicine does not help.  You have fluid, blood, or yellowish-white fluid (pus) coming from the incision lasting over 1 day.  You have muscle aches, chills, or you feel sick.  You have a bad smell coming from the incision or bandage.  Your incision opens up after stitches, staples, or sticky strips have been removed.  You keep feeling sick to your stomach (nauseous) or keep throwing up (vomiting). MAKE SURE YOU:   Understand these instructions.  Will watch your condition.  Will get help right away if you are not doing well or get worse. Document Released: 02/23/2012 Document Reviewed: 02/23/2012 [facility] Patient Information 2015 [place]. This information is not intended to replace advice given to you by your health  care provider. Make sure you discuss any questions you have with your health care provider.

## 2014-06-29 NOTE — Care Management Note (Unsigned)
    Page 1 of 1   06/29/2014     3:03:46 PM CARE MANAGEMENT NOTE 06/29/2014  Patient:  Alec Kelly, Alec Kelly   Account Number:  [id number]  Date Initiated:  06/29/2014  Documentation initiated by:  Kristyana Notte  Subjective/Objective Assessment:   Pt adm s/p subxiphoid pericardial window on 06/27/14.  PTA, pt independent, lives with girlfriend.     Action/Plan:   Will follow for home needs as pt progresses.   Anticipated DC Date:  07/01/2014   Anticipated DC Plan:  [place]  CM consult      Choice offered to / List presented to:             Status of service:  In process, will continue to follow Medicare Important Message given?   (If response is "NO", the following Medicare IM given date fields will be blank) Date Medicare IM given:   Medicare IM given by:   Date Additional Medicare IM given:   Additional Medicare IM given by:    Discharge Disposition:    Per UR Regulation:  Reviewed for med. necessity/level of care/duration of stay  If discussed at [place] of Stay Meetings, dates discussed:    Comments:

## 2014-06-30 ENCOUNTER — Encounter (HOSPITAL_COMMUNITY): Payer: Self-pay | Admitting: Surgery

## 2014-06-30 LAB — GLUCOSE, CAPILLARY
GLUCOSE-CAPILLARY: 107 mg/dL — AB (ref 70–99)
GLUCOSE-CAPILLARY: 131 mg/dL — AB (ref 70–99)
GLUCOSE-CAPILLARY: 129 mg/dL — AB (ref 70–99)
Glucose-Capillary: 127 mg/dL — ABNORMAL HIGH (ref 70–99)

## 2014-06-30 LAB — URIC ACID: Uric Acid, Serum: 6.1 mg/dL (ref 4.0–7.8)

## 2014-06-30 LAB — PROTIME-INR
INR: 2.53 — ABNORMAL HIGH (ref 0.00–1.49)
Prothrombin Time: 27.3 seconds — ABNORMAL HIGH (ref 11.6–15.2)

## 2014-06-30 MED ORDER — WARFARIN SODIUM 5 MG PO TABS
5.0000 mg | ORAL_TABLET | ORAL | Status: DC
  Administered 2014-06-30: 5 mg via ORAL
  Filled 2014-06-30: qty 1

## 2014-06-30 MED ORDER — ALLOPURINOL 100 MG PO TABS
100.0000 mg | ORAL_TABLET | Freq: Two times a day (BID) | ORAL | Status: DC
  Administered 2014-06-30 – 2014-07-01 (×3): 100 mg via ORAL
  Filled 2014-06-30 (×5): qty 1

## 2014-06-30 MED ORDER — COLCHICINE 0.6 MG PO TABS
0.6000 mg | ORAL_TABLET | Freq: Once | ORAL | Status: AC
  Administered 2014-06-30: 0.6 mg via ORAL
  Filled 2014-06-30: qty 1

## 2014-06-30 MED ORDER — PREDNISONE 20 MG PO TABS
20.0000 mg | ORAL_TABLET | Freq: Two times a day (BID) | ORAL | Status: DC
  Administered 2014-06-30 – 2014-07-01 (×3): 20 mg via ORAL
  Filled 2014-06-30 (×6): qty 1

## 2014-06-30 MED ORDER — COLCHICINE 0.6 MG PO TABS
1.2000 mg | ORAL_TABLET | Freq: Once | ORAL | Status: AC
  Administered 2014-06-30: 1.2 mg via ORAL
  Filled 2014-06-30: qty 2

## 2014-06-30 MED ORDER — WARFARIN SODIUM 7.5 MG PO TABS
7.5000 mg | ORAL_TABLET | ORAL | Status: DC
  Filled 2014-06-30: qty 1

## 2014-06-30 NOTE — Progress Notes (Signed)
[place] for warfarin Indication: mechanical valve  Allergies  Allergen Reactions  . Ivp Dye [Iodinated Diagnostic Agents] Nausea And Vomiting    ORAL LIQUID   . Morphine And Related   . Shellfish-Derived Products     exacerbates gout    Labs:  Recent Labs  06/28/14 0339 06/29/14 0532 06/30/14 0400  HGB 10.3*  --   --   HCT 33.0*  --   --   PLT 420*  --   --   LABPROT 21.5* 25.1* 27.3*  INR 1.87* 2.28* 2.53*  CREATININE 0.95  --   --     Estimated Creatinine Clearance: 117.6 ml/min (by C-G formula based on Cr of 0.95).   Assessment: 48 year old man s/p subxyphoid pericardial window 7/14 who is on warfarin prior to admission for mechanical valve.  INR 2.53 this morning.   Home dose is 5mg  daily except 7.5mg  on Tuesday and Saturday.   Goal of Therapy:  INR 2-3 (per coumadin clinic note)   Plan:  Resume home Coumadin dose  Daily protimes  Albertina Parr, PharmD.  Clinical Pharmacist Pager [phone]

## 2014-06-30 NOTE — Plan of Care (Signed)
Problem: Phase II Progression Outcomes Goal: Progress activity as tolerated unless otherwise ordered Outcome: Not Progressing Patient complaint of gout pain impeding his ability to ambulate as ordered. Additional gout medication added to address gout pain.

## 2014-06-30 NOTE — Progress Notes (Signed)
1040-1100 BP 110/80, STBBB 112. Encouraged pt to try to walk. Put foot of chair down. Tried to get pt to rock but could not bear weight on left leg due to left knee pain. Still has right arm pain also. Will continue to follow. Graylon Good RN BSN 06/30/2014 10:59 AM

## 2014-06-30 NOTE — Progress Notes (Addendum)
      [address]       [place] [address]             [phone]      3 Days Post-Op Procedure(s) (LRB): SUBXYPHOID PERICARDIAL WINDOW (N/A) INTRAOPERATIVE TRANSESOPHAGEAL ECHOCARDIOGRAM (N/A)  Subjective:  Alec Kelly continues to complain of gout this morning.  He refused all attempts at ambulation yesterday.  He states his right arm and left knee are bothering him and he does not want to fall.  I encouraged the patient to attempt and ambulate today and stressed the importance of getting up.   Objective: Vital signs in last 24 hours: Temp:  [98.4 F (36.9 C)-100.7 F (38.2 C)] 98.4 F (36.9 C) (07/17 0626) Pulse Rate:  [104-120] 106 (07/17 0626) Cardiac Rhythm:  [-] Normal sinus rhythm (07/16 2000) Resp:  [18-20] 18 (07/17 0626) BP: (112-126)/(63-76) 112/63 mmHg (07/17 0626) SpO2:  [94 %-96 %] 96 % (07/17 0626) Weight:  [255 lb 9.6 oz (115.939 kg)] 255 lb 9.6 oz (115.939 kg) (07/17 0626)  Intake/Output from previous day: 07/16 0701 - 07/17 0700 In: 960 [P.O.:960] Out: 650 [Urine:650]  General appearance: alert, cooperative and no distress Heart: regular rate and rhythm Lungs: clear to auscultation bilaterally Abdomen: soft, non-tender; bowel sounds normal; no masses,  no organomegaly Extremities: edema 1+ Wound: clean and dry  Lab Results:  Recent Labs  06/27/14 1059 06/28/14 0339  WBC 7.0 7.3  HGB 11.8* 10.3*  HCT 37.1* 33.0*  PLT 497* 420*   BMET:  Recent Labs  06/27/14 1059 06/28/14 0339  NA 139 138  K 4.5 4.5  CL 101 99  CO2 23 25  GLUCOSE 102* 97  BUN 13 11  CREATININE 1.00 0.95  CALCIUM 9.1 8.7    PT/INR:  Recent Labs  06/30/14 0400  LABPROT 27.3*  INR 2.53*   ABG    Component Value Date/Time   PHART 7.426 06/27/2014 1059   HCO3 24.9* 06/27/2014 1059   TCO2 26.1 06/27/2014 1059   ACIDBASEDEF 4.0* 06/03/2014 0417   O2SAT 96.5 06/27/2014 1059   CBG (last 3)   Recent Labs  06/29/14 1640 06/29/14 2055  06/30/14 0652  GLUCAP 113* 156* 107*    Assessment/Plan: S/P Procedure(s) (LRB): SUBXYPHOID PERICARDIAL WINDOW (N/A) INTRAOPERATIVE TRANSESOPHAGEAL ECHOCARDIOGRAM (N/A)  1. CV- NSR, remains Tachy- tolerating increase in Lopressor 2. INR 2.53, will continue Coumadin at 5 mg daily 3. Pulm- no acute issues, good use of IS 4. Gout- on Colchicine, Allopurinol 5. Deconditioning- patient refuses all attempts to ambulate, instructed on the importance of attempting to ambulate 6. Dispo- patient is medically stable for discharge, however due to Gout he is refusing to ambulate, needs to walk in order to be discharged   LOS: 3 days    Alec Kelly 06/30/2014   Chart reviewed, patient examined, agree with above. His gout pain in the right elbow is a little better but today he has severe pain and swelling from gout in the left knee and can't walk. Will give him a pulse of colchicine tonight and start him on prednisone. Indocin should not be used since he is on coumadin. Will check uric acid level and empirically increase allopurinol. Watch glucose on steroids.

## 2014-07-01 LAB — PROTIME-INR
INR: 2.82 — ABNORMAL HIGH (ref 0.00–1.49)
Prothrombin Time: 29.7 seconds — ABNORMAL HIGH (ref 11.6–15.2)

## 2014-07-01 LAB — GLUCOSE, CAPILLARY
GLUCOSE-CAPILLARY: 147 mg/dL — AB (ref 70–99)
Glucose-Capillary: 155 mg/dL — ABNORMAL HIGH (ref 70–99)
Glucose-Capillary: 145 mg/dL — ABNORMAL HIGH (ref 70–99)
Glucose-Capillary: 128 mg/dL — ABNORMAL HIGH (ref 70–99)

## 2014-07-01 MED ORDER — WARFARIN SODIUM 5 MG PO TABS
5.0000 mg | ORAL_TABLET | Freq: Every day | ORAL | Status: DC
  Administered 2014-07-01: 5 mg via ORAL
  Filled 2014-07-01 (×2): qty 1

## 2014-07-01 MED ORDER — WARFARIN SODIUM 5 MG PO TABS
5.0000 mg | ORAL_TABLET | Freq: Every day | ORAL | Status: DC
  Filled 2014-07-01: qty 1

## 2014-07-01 NOTE — Plan of Care (Signed)
Problem: Phase I Progression Outcomes Goal: Initial discharge plan identified Outcome: Progressing Patient continues to have left knee pain related to gout.  He is receiving PRN pain meds for comfort.  Encouraged patient to practice transferring from chair to bed.  He refused.  Will continue to monitor patient condition.  VSS.

## 2014-07-01 NOTE — Progress Notes (Signed)
CARDIAC REHAB PHASE I   PRE:  Rate/Rhythm: 80 SR    BP: sitting 100/76    SaO2: 94 RA  MODE:  Ambulation: 400 ft   POST:  Rate/Rhythm: 91 SR   BP: sitting 132/82     SaO2: 97 RA  Pts pain much improved today. Able to stand and walk with RW independently. Rest x1. Encouraged pt to walk x2 more today with RW. Can walk alone. [place], [place], ACSM 07/01/2014 11:55 AM

## 2014-07-01 NOTE — Progress Notes (Signed)
[place] for warfarin Indication: mechanical valve  Allergies  Allergen Reactions  . Ivp Dye [Iodinated Diagnostic Agents] Nausea And Vomiting    ORAL LIQUID   . Morphine And Related   . Shellfish-Derived Products     exacerbates gout    Labs:  Recent Labs  06/29/14 0532 06/30/14 0400 07/01/14 0332  LABPROT 25.1* 27.3* 29.7*  INR 2.28* 2.53* 2.82*    Estimated Creatinine Clearance: 117.3 ml/min (by C-G formula based on Cr of 0.95).   Assessment: 48 year old man s/p subxyphoid pericardial window 7/14 who is on warfarin prior to admission for mechanical valve.  INR 2.82 this morning.   Home dose is 5mg  daily except 7.5mg  on Tuesday and Saturday.   Goal of Therapy:  INR 2-3 (per coumadin clinic note)   Plan:  Change Coumadin dose to 5 mg daily. Will likely need to return to home dose once patient starts to tolerate full diet.  Daily protimes  Albertina Parr, PharmD.  Clinical Pharmacist Pager [phone]

## 2014-07-01 NOTE — Progress Notes (Addendum)
      [address]       [place] [address]             [phone]      4 Days Post-Op Procedure(s) (LRB): SUBXYPHOID PERICARDIAL WINDOW (N/A) INTRAOPERATIVE TRANSESOPHAGEAL ECHOCARDIOGRAM (N/A) Subjective: Feeling some relief now on steroids  Objective: Vital signs in last 24 hours: Temp:  [98.1 F (36.7 C)-99 F (37.2 C)] 99 F (37.2 C) (07/18 0400) Pulse Rate:  [80-106] 80 (07/18 0400) Cardiac Rhythm:  [-] Normal sinus rhythm (07/17 2009) Resp:  [17-20] 17 (07/18 0400) BP: (108-115)/(72-75) 108/72 mmHg (07/18 0400) SpO2:  [93 %-97 %] 93 % (07/18 0400)  Hemodynamic parameters for last 24 hours:    Intake/Output from previous day: 07/17 0701 - 07/18 0700 In: 480 [P.O.:480] Out: -  Intake/Output this shift:    General appearance: alert, cooperative and no distress Heart: regular rate and rhythm Lungs: mildly dim in the bases Abdomen: benign Extremities: minor edema Wound: incis healing well  Lab Results: No results found for this basename: WBC, HGB, HCT, PLT,  in the last 72 hours BMET: No results found for this basename: NA, K, CL, CO2, GLUCOSE, BUN, CREATININE, CALCIUM,  in the last 72 hours  PT/INR:  Recent Labs  07/01/14 0332  LABPROT 29.7*  INR 2.82*   ABG    Component Value Date/Time   PHART 7.426 06/27/2014 1059   HCO3 24.9* 06/27/2014 1059   TCO2 26.1 06/27/2014 1059   ACIDBASEDEF 4.0* 06/03/2014 0417   O2SAT 96.5 06/27/2014 1059   CBG (last 3)   Recent Labs  06/30/14 1641 06/30/14 2100 07/01/14 0601  GLUCAP 129* 127* 155*  No results found. Scheduled Meds: . allopurinol  100 mg Oral BID  . aspirin EC  81 mg Oral Daily  . docusate sodium  200 mg Oral Daily  . insulin aspart  0-24 Units Subcutaneous TID AC & HS  . metoprolol tartrate  12.5 mg Oral BID  . pantoprazole  40 mg Oral QAC breakfast  . predniSONE  20 mg Oral BID WC  . sodium chloride  3 mL Intravenous Q12H  . warfarin  5 mg Oral Once per day on Sun Mon Wed  Thu Fri  . warfarin  7.5 mg Oral Once per day on Tue Sat  . Warfarin - Pharmacist Dosing Inpatient   Does not apply q1800   Continuous Infusions:  PRN Meds:.sodium chloride, acetaminophen, bisacodyl, bisacodyl, oxyCODONE, sodium chloride   Assessment/Plan: S/P Procedure(s) (LRB): SUBXYPHOID PERICARDIAL WINDOW (N/A) INTRAOPERATIVE TRANSESOPHAGEAL ECHOCARDIOGRAM (N/A)  1 doing better in regards to gout sx. He believes he will be able to walk today. Will continue to monitor progress   LOS: 4 days    GOLD,WAYNE E 07/01/2014   Chart reviewed, patient examined, agree with above. His gout symptoms are much better today after colchicine pulse and steroids last night. He was able to ambulate today. His uric acid was 6.1 which is ok. Like to keep it less than 6. Will continue allopurinol bid and plan to taper steroids off over 7-10 days. He can go home tomorrow if he continues to feel well.

## 2014-07-02 LAB — GLUCOSE, CAPILLARY
Glucose-Capillary: 124 mg/dL — ABNORMAL HIGH (ref 70–99)
Glucose-Capillary: 105 mg/dL — ABNORMAL HIGH (ref 70–99)

## 2014-07-02 LAB — PROTIME-INR
INR: 3.04 — ABNORMAL HIGH (ref 0.00–1.49)
PROTHROMBIN TIME: 31.5 s — AB (ref 11.6–15.2)

## 2014-07-02 MED ORDER — WARFARIN SODIUM 2.5 MG PO TABS
2.5000 mg | ORAL_TABLET | Freq: Once | ORAL | Status: DC
  Filled 2014-07-02: qty 1

## 2014-07-02 MED ORDER — PREDNISONE 5 MG PO TABS
ORAL_TABLET | ORAL | Status: DC
Start: 2014-07-02 — End: 2014-07-14

## 2014-07-02 MED ORDER — OXYCODONE HCL 5 MG PO TABS: 5.0000 mg | ORAL_TABLET | ORAL | Status: DC | PRN

## 2014-07-02 MED ORDER — WARFARIN SODIUM 2.5 MG PO TABS: 2.5000 mg | ORAL_TABLET | Freq: Every day | ORAL | Status: DC

## 2014-07-02 NOTE — Progress Notes (Signed)
      [address]       [place] [address]             [phone]      5 Days Post-Op Procedure(s) (LRB): SUBXYPHOID PERICARDIAL WINDOW (N/A) INTRAOPERATIVE TRANSESOPHAGEAL ECHOCARDIOGRAM (N/A) Subjective: Feels well , wants to go home  Objective: Vital signs in last 24 hours: Temp:  [97.4 F (36.3 C)-98.8 F (37.1 C)] 98.5 F (36.9 C) (07/19 0517) Pulse Rate:  [76-91] 91 (07/19 0517) Cardiac Rhythm:  [-] Normal sinus rhythm;Bundle branch block (07/18 2020) Resp:  [17-19] 17 (07/19 0517) BP: (104-114)/(68-73) 105/68 mmHg (07/19 0517) SpO2:  [94 %-96 %] 95 % (07/19 0517) Weight:  [257 lb (116.574 kg)] 257 lb (116.574 kg) (07/19 0517)  Hemodynamic parameters for last 24 hours:    Intake/Output from previous day: 07/18 0701 - 07/19 0700 In: 1080 [P.O.:1080] Out: 7262 [Urine:1650] Intake/Output this shift: Total I/O In: 360 [P.O.:360] Out: 650 [Urine:650]  General appearance: alert, cooperative and no distress Heart: regular rate and rhythm Lungs: clear to auscultation bilaterally Abdomen: benign Extremities: no sig edema Wound: incis healing well  Lab Results: No results found for this basename: WBC, HGB, HCT, PLT,  in the last 72 hours BMET: No results found for this basename: NA, K, CL, CO2, GLUCOSE, BUN, CREATININE, CALCIUM,  in the last 72 hours  PT/INR:  Recent Labs  07/02/14 0315  LABPROT 31.5*  INR 3.04*   ABG    Component Value Date/Time   PHART 7.426 06/27/2014 1059   HCO3 24.9* 06/27/2014 1059   TCO2 26.1 06/27/2014 1059   ACIDBASEDEF 4.0* 06/03/2014 0417   O2SAT 96.5 06/27/2014 1059   CBG (last 3)   Recent Labs  07/01/14 1614 07/01/14 2110 07/02/14 0607  GLUCAP 145* 128* 124*   Scheduled Meds: . allopurinol  100 mg Oral BID  . aspirin EC  81 mg Oral Daily  . docusate sodium  200 mg Oral Daily  . insulin aspart  0-24 Units Subcutaneous TID AC & HS  . metoprolol tartrate  12.5 mg Oral BID  . pantoprazole  40 mg Oral  QAC breakfast  . predniSONE  20 mg Oral BID WC  . sodium chloride  3 mL Intravenous Q12H  . warfarin  2.5 mg Oral ONCE-1800  . Warfarin - Pharmacist Dosing Inpatient   Does not apply q1800   Continuous Infusions:  PRN Meds:.sodium chloride, acetaminophen, bisacodyl, bisacodyl, oxyCODONE, sodium chloride  No results found.  Assessment/Plan: S/P Procedure(s) (LRB): SUBXYPHOID PERICARDIAL WINDOW (N/A) INTRAOPERATIVE TRANSESOPHAGEAL ECHOCARDIOGRAM (N/A) Plan for discharge: see discharge orders   LOS: 5 days    Alec Kelly E 07/02/2014

## 2014-07-02 NOTE — Progress Notes (Signed)
[place] for warfarin Indication: mechanical valve  Allergies  Allergen Reactions  . Ivp Dye [Iodinated Diagnostic Agents] Nausea And Vomiting    ORAL LIQUID   . Morphine And Related   . Shellfish-Derived Products     exacerbates gout    Labs:  Recent Labs  06/30/14 0400 07/01/14 0332 07/02/14 0315  LABPROT 27.3* 29.7* 31.5*  INR 2.53* 2.82* 3.04*    Estimated Creatinine Clearance: 118 ml/min (by C-G formula based on Cr of 0.95).   Assessment: 48 year old man s/p subxyphoid pericardial window 7/14 who is on warfarin prior to admission for mechanical valve.  INR 3.04 this morning. Pt is eating 100% of meals now and plan is to taper down steroid dose.   Home dose is 5mg  daily except 7.5mg  on Tuesday and Saturday.   Goal of Therapy:  INR 2-3 (per coumadin clinic note)   Plan:  Coumadin 2.5 mg x 1 dose today. May resume home coumadin dose starting tomorrow.  Daily protimes  Albertina Parr, PharmD.  Clinical Pharmacist Pager [phone]

## 2014-07-02 NOTE — Progress Notes (Signed)
Discharged to home with family office visits in place teaching done  

## 2014-07-04 ENCOUNTER — Encounter: Payer: Self-pay | Admitting: Physician Assistant

## 2014-07-04 ENCOUNTER — Ambulatory Visit (INDEPENDENT_AMBULATORY_CARE_PROVIDER_SITE_OTHER): Payer: Self-pay

## 2014-07-04 ENCOUNTER — Ambulatory Visit (INDEPENDENT_AMBULATORY_CARE_PROVIDER_SITE_OTHER): Payer: Self-pay | Admitting: Physician Assistant

## 2014-07-04 VITALS — BP 105/70 | HR 75 | Ht 68.0 in | Wt 256.0 lb

## 2014-07-04 DIAGNOSIS — Z954 Presence of other heart-valve replacement: Secondary | ICD-10-CM

## 2014-07-04 DIAGNOSIS — I319 Disease of pericardium, unspecified: Secondary | ICD-10-CM

## 2014-07-04 DIAGNOSIS — I3139 Other pericardial effusion (noninflammatory): Secondary | ICD-10-CM

## 2014-07-04 DIAGNOSIS — I359 Nonrheumatic aortic valve disorder, unspecified: Secondary | ICD-10-CM

## 2014-07-04 DIAGNOSIS — I447 Left bundle-branch block, unspecified: Secondary | ICD-10-CM

## 2014-07-04 DIAGNOSIS — I313 Pericardial effusion (noninflammatory): Secondary | ICD-10-CM

## 2014-07-04 DIAGNOSIS — I428 Other cardiomyopathies: Secondary | ICD-10-CM

## 2014-07-04 LAB — POCT INR: INR: 3.8

## 2014-07-04 NOTE — Progress Notes (Signed)
Cardiology Office Note    Date:  07/04/2014   ID:  Alec Kelly, DOB [birth year], MRN [id number]  PCP:  No PCP Per Patient  Cardiologist:  Dr. Fransico Him      History of Present Illness: Alec Kelly is a 48 y.o. male who saw Dr. Radford Pax after an abnormal CT demonstrated calcification of the aortic valve. Echocardiogram demonstrated significant aortic stenosis. Cardiac catheterization demonstrated no significant CAD. He was noted to have an ascending thoracic aortic aneurysm.  He underwent Bentall procedure with Dr. Cyndia Bent 05/2014. A mechanical St. Jude aortic valve was used.  Follow up echocardiogram did demonstrate moderate pericardial effusion but no evidence of tamponade.    I saw him 06/20/14.  He had a questionable syncopal episode.  F/u echo was done that demonstrated a stable effusion.  There was no evidence of tamponade.   He followed up with Dr. Vivi Martens office and continued to feel poorly.  He was therefore admitted 7/14-7/16 for subxiphoid pericardial window for probable hemorrhagic pericardial effusion.    He is feeling much better. He denies dyspnea. He denies lightheadedness or dizziness. He denies syncope. He denies orthopnea, PND or edema. His chest remains somewhat sore. He denies fever or cough.  Studies:  - LHC (05/15/14):  Ostial circumflex 20%, EF 25-30%  - Echo (05/09/14):  EF 25-30%, moderate aortic stenosis, mean gradient 32 mm Hg  - Echo (06/12/14):  Moderate LVH, EF 30-35%, mechanical AVR ok (mean 11 mm Hg), mild LAE, mildly reduced RVSF, mild to moderate RAE, moderate pericardial effusion  - Echo (06/20/14):  EF 25-30%, mod effusion  - Carotid US (05/31/14):  Bilateral ICA 1-39%   Recent Labs: 06/27/2014: ALT 70*  06/28/2014: Creatinine 0.95; Hemoglobin 10.3*; Potassium 4.5   Wt Readings from Last 3 Encounters:  07/02/14 257 lb (116.574 kg)  07/02/14 257 lb (116.574 kg)  06/26/14 261 lb (118.389 kg)     Past Medical History  Diagnosis Date  .  Diverticulitis   . Diverticulitis of sigmoid colon 04/07/2013    With perforation  . Obesity, Class III, BMI 40-49.9 (morbid obesity) 04/07/2013  . 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 Aortic arch aneurysm 2015  . Aortic stenosis 2015    s/p Bentall with mechanical (St Jude AVR) 05/2014  . Personal history of kidney stones   . GERD (gastroesophageal reflux disease)   . Claustrophobia     does not like anything put over his face  . NICM (nonischemic cardiomyopathy)     Echo (06/12/14):  Moderate LVH, EF 30-35%, mechanical AVR ok (mean 11 mm Hg), mild LAE, mildly reduced RVSF, mild to moderate RAE, moderate pericardial effusion  . Carotid stenosis     Carotid US (05/31/14):  Bilateral ICA 1-39%  . Hx of cardiac cath     a. LHC (05/15/14):  Ostial circumflex 20%, EF 25-30%      Current Outpatient Prescriptions  Medication Sig Dispense Refill  . acetaminophen (TYLENOL) 500 MG tablet Take 500-2,000 mg by mouth every 6 (six) hours as needed.      Marland Kitchen allopurinol (ZYLOPRIM) 100 MG tablet Take 1 tablet (100 mg total) by mouth daily as needed (for gout).  30 tablet  0  . aspirin EC 81 MG EC tablet Take 1 tablet (81 mg total) by mouth daily.      . metoprolol tartrate (LOPRESSOR) 25 MG tablet Take 12.5 mg by mouth 2 (two) times daily.      Marland Kitchen oxyCODONE (OXY  IR/ROXICODONE) 5 MG immediate release tablet Take 1-2 tablets (5-10 mg total) by mouth every 4 (four) hours as needed for severe pain.  30 tablet  0  . predniSONE (DELTASONE) 5 MG tablet Take 20 mg daily for 3 days, followed by 10 mg daily for 3 days, followed by 5 mg daily for 3 days  21 tablet  0  . warfarin (COUMADIN) 2.5 MG tablet Take 1 tablet (2.5 mg total) by mouth daily at 6 PM. Takes 7.5 mg on Tuesday and Saturday and 5 mg all other days  60 tablet  1   No current facility-administered medications for this visit.    Allergies:   Ivp dye; Morphine and related; and Shellfish-derived products   Social History:  The patient  reports  that he has never smoked. He has never used smokeless tobacco. He reports that he drinks alcohol. He reports that he does not use illicit drugs.   Family History:  The patient's family history includes COPD in his mother; Cancer in his father; Coronary artery disease in his father; Diabetes in his mother; Heart disease in his father; Heart failure in his mother; Hyperlipidemia in his father and mother; Hypertension in his mother.   ROS:  Please see the history of present illness.    All other systems reviewed and negative.   PHYSICAL EXAM: VS:  BP 105/70  Pulse 75  Ht 5\' 8"  (1.727 m)  Wt 256 lb (116.121 kg)  BMI 38.93 kg/m2 Well nourished, well developed, in no acute distress HEENT: normal Neck: no JVD Cardiac:  normal S1, mechanical S2; RRR; no murmur Lungs:   clear to auscultation bilaterally, no wheezing, rhonchi or rales Abd: soft, nontender, no hepatomegaly Ext:  no edema Skin: warm and dry Neuro:  CNs 2-12 intact, no focal abnormalities noted   EKG:  NSR, HR 75, LBBB      ASSESSMENT AND PLAN:  1. Pericardial Effusion:  He is feeling much better after recent pericardial window. Followup with Dr. Cyndia Bent as planned. 2. Aortic stenosis with ascending aortic aneurysm, s/p Bentall with mechanical AVR:  Continue SBE prophylaxis.  Continue Coumadin.   3. NICM (nonischemic cardiomyopathy):  Continue beta blocker. I will attempt to add ACE inhibitor at followup. Patient did provide me with paperwork to fill out for short-term disability. This will be completed and returned to him in the near future. 4. LBBB (left bundle branch block) 5. Disposition: Followup with me in 3 weeks and Dr. Radford Pax in 8 weeks.    Signed, Versie Starks, MHS 07/04/2014 2:48 PM    [facility] [address], [place], [place]  [address] Phone: [phone]; Fax: [phone]

## 2014-07-04 NOTE — Patient Instructions (Signed)
NO CHANGES WERE MADE TODAY WITH MEDICATIONS  Your physician recommends that you schedule a follow-up appointment in: [place], [place] physician recommends that you schedule a follow-up appointment in: [place]

## 2014-07-05 ENCOUNTER — Ambulatory Visit: Payer: Self-pay | Admitting: Surgery

## 2014-07-06 ENCOUNTER — Telehealth: Payer: Self-pay | Admitting: Cardiology

## 2014-07-06 NOTE — Telephone Encounter (Signed)
New message  Pt called states that he will need a form filled out and faxed to his place of employment [employer]) indicating he will be out of work for a few months from heart operation. Please fax to the number listed below by 5pm 07/07/2014.    [employer] Fax #: [phone] Attn: Brett Canales

## 2014-07-06 NOTE — Telephone Encounter (Signed)
Would pt need to call the Dr That did the Heart surgery? To Dr Radford Pax to advise.

## 2014-07-06 NOTE — Telephone Encounter (Signed)
Needs to come from surgeon 

## 2014-07-06 NOTE — Telephone Encounter (Signed)
Left message to make pt aware he has to call Dr Vivi Martens office for letter.

## 2014-07-11 ENCOUNTER — Ambulatory Visit (INDEPENDENT_AMBULATORY_CARE_PROVIDER_SITE_OTHER): Payer: Self-pay | Admitting: Surgery

## 2014-07-11 DIAGNOSIS — I359 Nonrheumatic aortic valve disorder, unspecified: Secondary | ICD-10-CM

## 2014-07-11 DIAGNOSIS — Z954 Presence of other heart-valve replacement: Secondary | ICD-10-CM

## 2014-07-11 LAB — POCT INR: INR: 1.2

## 2014-07-14 ENCOUNTER — Other Ambulatory Visit: Payer: Self-pay | Admitting: *Deleted

## 2014-07-14 DIAGNOSIS — M10462 Other secondary gout, left knee: Secondary | ICD-10-CM

## 2014-07-14 MED ORDER — PREDNISONE 5 MG PO TABS: ORAL_TABLET | ORAL | Status: DC

## 2014-07-14 MED ORDER — COLCHICINE 0.6 MG PO TABS: 0.6000 mg | ORAL_TABLET | Freq: Two times a day (BID) | ORAL | Status: DC

## 2014-07-18 ENCOUNTER — Other Ambulatory Visit: Payer: Self-pay | Admitting: Surgery

## 2014-07-18 DIAGNOSIS — I319 Disease of pericardium, unspecified: Secondary | ICD-10-CM

## 2014-07-19 ENCOUNTER — Encounter: Payer: Self-pay | Admitting: Surgery

## 2014-07-19 ENCOUNTER — Ambulatory Visit (INDEPENDENT_AMBULATORY_CARE_PROVIDER_SITE_OTHER): Payer: No Typology Code available for payment source | Admitting: Pharmacist

## 2014-07-19 ENCOUNTER — Ambulatory Visit
Admission: RE | Admit: 2014-07-19 | Discharge: 2014-07-19 | Disposition: A | Discharge status: Home / Self Care | Payer: Self-pay | Source: Ambulatory Visit | Attending: Surgery | Admitting: Surgery

## 2014-07-19 ENCOUNTER — Ambulatory Visit (INDEPENDENT_AMBULATORY_CARE_PROVIDER_SITE_OTHER): Payer: Self-pay | Admitting: Surgery

## 2014-07-19 VITALS — BP 107/75 | HR 84 | Ht 68.0 in | Wt 256.0 lb

## 2014-07-19 DIAGNOSIS — I359 Nonrheumatic aortic valve disorder, unspecified: Secondary | ICD-10-CM

## 2014-07-19 DIAGNOSIS — I313 Pericardial effusion (noninflammatory): Secondary | ICD-10-CM

## 2014-07-19 DIAGNOSIS — Z954 Presence of other heart-valve replacement: Secondary | ICD-10-CM

## 2014-07-19 DIAGNOSIS — I319 Disease of pericardium, unspecified: Secondary | ICD-10-CM

## 2014-07-19 DIAGNOSIS — I3139 Other pericardial effusion (noninflammatory): Secondary | ICD-10-CM

## 2014-07-19 LAB — POCT INR: INR: 1.4

## 2014-07-19 NOTE — Progress Notes (Signed)
HPI:  Patient returns for routine postoperative follow-up having undergone subxyphoid pericardial window on 06/27/2014 for drainage of a pericardial effusion following Bentall procedure and replacement of the ascending aorta on 05/30/2014. The patient's early postoperative recovery while in the hospital was notable for development of gout in his left knee. He was already on allopurinol chronically. He was started on colchicine and after 24 hrs had not significantly improved so he was started on prednisone. A NSAID was not used due to need for coumadin for his mechanical valve. He improved quickly with prednisone and was discharged on daily colchicine and a prednisone taper. Since hospital discharge the patient reports that his left knee has remained very swollen and sore which is limiting his ambulation. He has had this problem before and required aspiration of his knee last year while in the hospital with diverticulitis. This was performed by Dr. Berenice Primas. He denies any shortness of breath or chest pain.   Current Outpatient Prescriptions  Medication Sig Dispense Refill  . acetaminophen (TYLENOL) 500 MG tablet Take 500-2,000 mg by mouth every 6 (six) hours as needed.      Marland Kitchen allopurinol (ZYLOPRIM) 100 MG tablet Take 1 tablet (100 mg total) by mouth daily as needed (for gout).  30 tablet  0  . aspirin EC 81 MG EC tablet Take 1 tablet (81 mg total) by mouth daily.      . colchicine 0.6 MG tablet Take 1 tablet (0.6 mg total) by mouth 2 (two) times daily. Until sxs resolve then stop  30 tablet  0  . metoprolol tartrate (LOPRESSOR) 25 MG tablet Take 12.5 mg by mouth 2 (two) times daily.      Marland Kitchen oxyCODONE (OXY IR/ROXICODONE) 5 MG immediate release tablet Take 1-2 tablets (5-10 mg total) by mouth every 4 (four) hours as needed for severe pain.  30 tablet  0  . predniSONE (DELTASONE) 5 MG tablet Take 20 mg daily for 3 days, followed by 10 mg daily for 3 days, followed by 5 mg daily for 3 days  21 tablet   0  . warfarin (COUMADIN) 2.5 MG tablet Take 1 tablet (2.5 mg total) by mouth daily at 6 PM. Takes 7.5 mg on Tuesday and Saturday and 5 mg all other days  60 tablet  1   No current facility-administered medications for this visit.    Physical Exam: BP 107/75  Pulse 84  Ht 5\' 8"  (1.727 m)  Wt 256 lb (116.121 kg)  BMI 38.93 kg/m2  SpO2 98% He looks well. Lung exam is clear. Cardiac exam shows a regular rate and rhythm with normal mechanical heart sounds. Chest incision is healing well and sternum is stable. The left knee is swollen, mildly tender with no warmth or erythema    Diagnostic Tests:  CLINICAL DATA: Pericardial window performed on 06/27/2014 for  pericardial effusion.  EXAM:  CHEST 2 VIEW  COMPARISON: 06/28/2014  FINDINGS:  Cardiopericardial silhouette is normal in size, decreased from the  prior study.  Normal mediastinal and hilar contours. Stable changes from previous  cardiac surgery and valve replacement.  Clear lungs. No pleural effusion or pneumothorax.  Stable changes from right breast surgery.  Bony thorax is intact.  IMPRESSION:  No acute cardiopulmonary disease. No cardiopericardial enlargement.  Electronically Signed  By: Lajean Manes M.D.  On: 07/19/2014 11:23    Impression:  Overall I think he is doing well from a cardiac standpoint. I encouraged him to continue walking but he is  limited due to his left knee swelling presumably from gout. It is better but not resolved with colchicine and prednisone. He is going to set up an appt with Dr. Berenice Primas to see if it needs to be aspirated.  I told him he could drive his car but should not lift anything heavier than 10 lbs for three months postop. He is having his INR checked today by the [facility] anticoagulation clinic with the results going to [facility]. He needs an INR of 2.5-3.5 with a mechanical AVR.   Plan:  He will continue to follow up with cardiology and is suppose to see a new PCP in the middle of  August according to the patient and his significant other. He is going to make an appt to see Dr. Berenice Primas concerning his left knee swelling. I will see him back if the need arises.

## 2014-07-22 ENCOUNTER — Encounter (HOSPITAL_BASED_OUTPATIENT_CLINIC_OR_DEPARTMENT_OTHER): Payer: Self-pay | Admitting: Emergency Medicine

## 2014-07-22 DIAGNOSIS — E669 Obesity, unspecified: Secondary | ICD-10-CM | POA: Insufficient documentation

## 2014-07-22 DIAGNOSIS — Z7901 Long term (current) use of anticoagulants: Secondary | ICD-10-CM | POA: Insufficient documentation

## 2014-07-22 DIAGNOSIS — Z7982 Long term (current) use of aspirin: Secondary | ICD-10-CM | POA: Insufficient documentation

## 2014-07-22 DIAGNOSIS — IMO0002 Reserved for concepts with insufficient information to code with codable children: Secondary | ICD-10-CM | POA: Insufficient documentation

## 2014-07-22 DIAGNOSIS — Z87442 Personal history of urinary calculi: Secondary | ICD-10-CM | POA: Insufficient documentation

## 2014-07-22 DIAGNOSIS — R209 Unspecified disturbances of skin sensation: Secondary | ICD-10-CM | POA: Insufficient documentation

## 2014-07-22 DIAGNOSIS — Z8719 Personal history of other diseases of the digestive system: Secondary | ICD-10-CM | POA: Insufficient documentation

## 2014-07-22 DIAGNOSIS — M79609 Pain in unspecified limb: Secondary | ICD-10-CM | POA: Insufficient documentation

## 2014-07-22 DIAGNOSIS — M109 Gout, unspecified: Secondary | ICD-10-CM | POA: Insufficient documentation

## 2014-07-22 DIAGNOSIS — M25569 Pain in unspecified knee: Secondary | ICD-10-CM | POA: Insufficient documentation

## 2014-07-22 DIAGNOSIS — Z79899 Other long term (current) drug therapy: Secondary | ICD-10-CM | POA: Insufficient documentation

## 2014-07-22 DIAGNOSIS — Z9889 Other specified postprocedural states: Secondary | ICD-10-CM | POA: Insufficient documentation

## 2014-07-22 NOTE — ED Notes (Signed)
Pt reports a burning feeling in his right thigh for over one week. Recent open heart surgery.

## 2014-07-23 ENCOUNTER — Emergency Department (HOSPITAL_BASED_OUTPATIENT_CLINIC_OR_DEPARTMENT_OTHER): Payer: Self-pay

## 2014-07-23 ENCOUNTER — Emergency Department (HOSPITAL_BASED_OUTPATIENT_CLINIC_OR_DEPARTMENT_OTHER)
Admission: EM | Admit: 2014-07-23 | Discharge: 2014-07-23 | Disposition: A | Discharge status: Home / Self Care | Payer: Self-pay | Attending: Emergency Medicine | Admitting: Emergency Medicine

## 2014-07-23 DIAGNOSIS — M25562 Pain in left knee: Secondary | ICD-10-CM

## 2014-07-23 MED ORDER — HYDROCODONE-ACETAMINOPHEN 5-325 MG PO TABS: 1.0000 | ORAL_TABLET | Freq: Four times a day (QID) | ORAL | Status: DC | PRN

## 2014-07-23 MED ORDER — HYDROCODONE-ACETAMINOPHEN 5-325 MG PO TABS
1.0000 | ORAL_TABLET | Freq: Once | ORAL | Status: AC
  Administered 2014-07-23: 1 via ORAL
  Filled 2014-07-23: qty 1

## 2014-07-23 NOTE — ED Notes (Signed)
Patient complaining of need for pain medication, states he was told half an hour ago he would be getting help.

## 2014-07-23 NOTE — ED Provider Notes (Addendum)
CSN: [id number]     Arrival date & time 07/22/14  2340 History   First MD Initiated Contact with Patient 07/23/14 [phone]     Chief Complaint  Patient presents with  . Leg Pain     (Consider location/radiation/quality/duration/timing/severity/associated sxs/prior Treatment) HPI This is a 48 year old male with a history of gout who recently underwent a pericardial window. About 6 days ago he developed acute severe pain and swelling of his left knee. He was unable to find transportation to get medical attention. Since that time his knee has become significantly less swollen and less painful but he now has pain primarily on the lateral aspect of the left knee radiating to his left thigh. The pain is associated with paresthesias which he describes as "pins and needles". He is finishing a prednisone taper and has been reluctant to take his prescribed oxycodone for the pain because he does not like the way it makes him feel. Pain is worse with flexion at the left knee. He has a history of arthrocentesis of that knee about a year ago. There is no associated redness or warmth.   Past Medical History  Diagnosis Date  . Diverticulitis   . Diverticulitis of sigmoid colon 04/07/2013    With perforation  . Obesity, Class III, BMI 40-49.9 (morbid obesity) 04/07/2013  . 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 Aortic arch aneurysm 2015  . Aortic stenosis 2015    s/p Bentall with mechanical (St Jude AVR) 05/2014  . Personal history of kidney stones   . GERD (gastroesophageal reflux disease)   . Claustrophobia     does not like anything put over his face  . NICM (nonischemic cardiomyopathy)     Echo (06/12/14):  Moderate LVH, EF 30-35%, mechanical AVR ok (mean 11 mm Hg), mild LAE, mildly reduced RVSF, mild to moderate RAE, moderate pericardial effusion  . Carotid stenosis     Carotid US (05/31/14):  Bilateral ICA 1-39%  . Hx of cardiac cath     a. LHC (05/15/14):  Ostial circumflex 20%, EF 25-30%   Past  Surgical History  Procedure Laterality Date  . Tonsillectomy    . Appendectomy      45-57 years old - Dr. Lennie Hummer  . Hernia repair  08/20/931    umbilical  - At [facility], Dr. Lennie Hummer, Repair of umbilical hernia with mesh.  . Lithotripsy    . Bentall procedure N/A 06/02/2014    Procedure: BENTALL PROCEDURE: 67mm St Jude Valve Conduit ;  Surgeon: Gaye Pollack, MD;  Location: [place];  Service: Open Heart Surgery;  Laterality: N/A;  . Ascending aortic root replacement N/A 06/02/2014    Procedure: Aortic Arch Replacement with 61mm Hemasheild Graft;  Surgeon: Gaye Pollack, MD;  Location: [place];  Service: Open Heart Surgery;  Laterality: N/A;  . Intraoperative transesophageal echocardiogram N/A 06/02/2014    Procedure: INTRAOPERATIVE TRANSESOPHAGEAL ECHOCARDIOGRAM;  Surgeon: Gaye Pollack, MD;  Location: [facility] OR;  Service: Open Heart Surgery;  Laterality: N/A;  . Subxyphoid pericardial window N/A 06/27/2014    Procedure: SUBXYPHOID PERICARDIAL WINDOW;  Surgeon: Gaye Pollack, MD;  Location: [place];  Service: Thoracic;  Laterality: N/A;  . Intraoperative transesophageal echocardiogram N/A 06/27/2014    Procedure: INTRAOPERATIVE TRANSESOPHAGEAL ECHOCARDIOGRAM;  Surgeon: Gaye Pollack, MD;  Location: [facility] OR;  Service: Open Heart Surgery;  Laterality: N/A;   Family History  Problem Relation Age of Onset  . Heart failure Mother   . Diabetes  Mother   . Hypertension Mother   . Hyperlipidemia Mother   . COPD Mother   . Cancer Father     lung & prostate  . Hyperlipidemia Father   . Heart disease Father   . Coronary artery disease Father    History  Substance Use Topics  . Smoking status: Never Smoker   . Smokeless tobacco: Never Used  . Alcohol Use: Yes     Comment: social    Review of Systems  All other systems reviewed and are negative.   Allergies  Ivp dye; Morphine and related; and Shellfish-derived products  Home Medications   Prior to Admission medications   Medication Sig Start  Date End Date Taking? Authorizing Provider  acetaminophen (TYLENOL) 500 MG tablet Take 500-2,000 mg by mouth every 6 (six) hours as needed.    Historical Provider, MD  allopurinol (ZYLOPRIM) 100 MG tablet Take 1 tablet (100 mg total) by mouth daily as needed (for gout). 06/13/14   [name], PA-C  aspirin EC 81 MG EC tablet Take 1 tablet (81 mg total) by mouth daily. 06/09/14   [name], PA-C  colchicine 0.6 MG tablet Take 1 tablet (0.6 mg total) by mouth 2 (two) times daily. Until sxs resolve then stop 07/14/14   Ivin Poot, MD  HYDROcodone-acetaminophen (NORCO/VICODIN) 5-325 MG per tablet Take 1-2 tablets by mouth every 6 (six) hours as needed. 07/23/14   Karen Chafe Sharyon Peitz, MD  metoprolol tartrate (LOPRESSOR) 25 MG tablet Take 12.5 mg by mouth 2 (two) times daily. 06/20/14   [name], PA-C  oxyCODONE (OXY IR/ROXICODONE) 5 MG immediate release tablet Take 1-2 tablets (5-10 mg total) by mouth every 4 (four) hours as needed for severe pain. 07/02/14   [name], PA-C  predniSONE (DELTASONE) 5 MG tablet Take 20 mg daily for 3 days, followed by 10 mg daily for 3 days, followed by 5 mg daily for 3 days 07/14/14   Ivin Poot, MD  warfarin (COUMADIN) 2.5 MG tablet Take 1 tablet (2.5 mg total) by mouth daily at 6 PM. Takes 7.5 mg on Tuesday and Saturday and 5 mg all other days 07/02/14   Patrick Jupiter E Gold, PA-C   BP 107/66  Pulse 77  Temp(Src) 98.3 F (36.8 C) (Oral)  Resp 18  Ht 5\' 8"  (1.727 m)  Wt 248 lb (112.492 kg)  BMI 37.72 kg/m2  SpO2 98%  Physical Exam General: Well-developed, well-nourished male in no acute distress; appearance consistent with age of record HENT: normocephalic; atraumatic Eyes: pupils equal, round and reactive to light; extraocular muscles intact Neck: supple Heart: regular rate and rhythm Lungs: clear to auscultation bilaterally Abdomen: soft; nondistended Extremities: No deformity; full range of motion except left knee with limited range of  motion due to pain on flexion; small left knee joint effusion; no edema, warm or erythema of left lower extremity Neurologic: Awake, alert and oriented; motor function intact in all extremities and symmetric; no facial droop Skin: Warm and dry Psychiatric: Normal mood and affect    ED Course  Procedures (including critical care time)  MDM  Nursing notes and vitals signs, including pulse oximetry, reviewed.  Summary of this visit's results, reviewed by myself:  Labs:  No results found for this or any previous visit (from the past 24 hour(s)).  Imaging Studies: Dg Knee 2 Views Left  07/23/2014   CLINICAL DATA:  Burning sensation in the right thigh for more than 1 week.  EXAM: LEFT  KNEE - 1-2 VIEW  COMPARISON:  None.  FINDINGS: There is no evidence of fracture or dislocation. The joint spaces are preserved. No significant degenerative change is seen; the patellofemoral joint is grossly unremarkable in appearance. A fabella is noted.  A small to moderate knee joint effusion is seen. The visualized soft tissues are otherwise unremarkable in appearance.  IMPRESSION: 1. No evidence of fracture or dislocation. 2. Small to moderate knee joint effusion. Evaluation of the soft tissues of the right thigh is limited on radiograph.   Electronically Signed   By: Garald Balding M.D.   On: 07/23/2014 06:28    We'll avoid NSAIDs as patient has completed a prednisone taper. Suspect the patient's symptoms are due to a gout flare. He has been seen in the past by Dr. Berenice Primas but states he does not have the money to see him again.   Wynetta Fines, MD 07/23/14 [id number]  Wynetta Fines, MD 07/23/14 [phone]

## 2014-07-23 NOTE — Discharge Instructions (Signed)

## 2014-07-24 DIAGNOSIS — Z0279 Encounter for issue of other medical certificate: Secondary | ICD-10-CM

## 2014-07-26 ENCOUNTER — Ambulatory Visit (INDEPENDENT_AMBULATORY_CARE_PROVIDER_SITE_OTHER): Payer: Self-pay | Admitting: Pharmacist

## 2014-07-26 ENCOUNTER — Encounter: Payer: Self-pay | Admitting: Physician Assistant

## 2014-07-26 ENCOUNTER — Ambulatory Visit (INDEPENDENT_AMBULATORY_CARE_PROVIDER_SITE_OTHER): Payer: Self-pay | Admitting: Physician Assistant

## 2014-07-26 VITALS — BP 114/80 | HR 74 | Ht 68.0 in | Wt 247.0 lb

## 2014-07-26 DIAGNOSIS — Z954 Presence of other heart-valve replacement: Secondary | ICD-10-CM

## 2014-07-26 DIAGNOSIS — I428 Other cardiomyopathies: Secondary | ICD-10-CM

## 2014-07-26 DIAGNOSIS — I359 Nonrheumatic aortic valve disorder, unspecified: Secondary | ICD-10-CM

## 2014-07-26 LAB — POCT INR: INR: 1.2

## 2014-07-26 MED ORDER — LISINOPRIL 2.5 MG PO TABS: 2.5000 mg | ORAL_TABLET | Freq: Every day | ORAL | Status: DC

## 2014-07-26 NOTE — Patient Instructions (Signed)
START LISINOPRIL 2.5 MG DAILY  LAB WORK IN 1-2 WEEKS BMET; YOU HAVE BEEN GIVEN AN RX FOR THIS ORDER  You have been referred to [place] physician recommends that you schedule a follow-up appointment in: [place]

## 2014-07-26 NOTE — Progress Notes (Signed)
Cardiology Office Note    Date:  07/26/2014   ID:  Alec Kelly, DOB 12-11-1966, MRN [id number]  PCP:  No PCP Per Patient  Cardiologist:  Dr. Fransico Him      History of Present Illness: Alec Kelly is a 48 y.o. male with a hx of aortic stenosis and an ascending thoracic aortic aneurysm.  He underwent Bentall procedure with Dr. Cyndia Bent 05/2014 with a mechanical St. Jude aortic valve.  Follow up echocardiogram did demonstrate moderate pericardial effusion but no evidence of tamponade.  Symptoms worsened after DC and he was ultimately re-admitted 7/14-7/16 for subxiphoid pericardial window for probable hemorrhagic pericardial effusion.    He returns for follow up.  He is feeling good.  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  This is better with treatment.  He does have some burning pain in his L thigh now.  The patient denies significant chest pain, shortness of breath, syncope, orthopnea, PND or significant pedal edema.    Studies:  - LHC (05/15/14):  Ostial circumflex 20%, EF 25-30%  - Echo (05/09/14):  EF 25-30%, moderate aortic stenosis, mean gradient 32 mm Hg  - Echo (06/12/14):  Moderate LVH, EF 30-35%, mechanical AVR ok (mean 11 mm Hg), mild LAE, mildly reduced RVSF, mild to moderate RAE, moderate pericardial effusion  - Echo (06/20/14):  EF 25-30%, mod effusion  - Carotid US (05/31/14):  Bilateral ICA 1-39%   Recent Labs: 06/27/2014: ALT 70*  06/28/2014: Creatinine 0.95; Hemoglobin 10.3*; Potassium 4.5   Wt Readings from Last 3 Encounters:  07/26/14 247 lb (112.038 kg)  07/22/14 248 lb (112.492 kg)  07/19/14 256 lb (116.121 kg)     Past Medical History  Diagnosis Date  . Diverticulitis   . Diverticulitis of sigmoid colon 04/07/2013    With perforation  . Obesity, Class III, BMI 40-49.9 (morbid obesity) 04/07/2013  . 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 Aortic arch aneurysm 2015  . Aortic stenosis 2015    s/p Bentall with mechanical (St  Jude AVR) 05/2014  . Personal history of kidney stones   . GERD (gastroesophageal reflux disease)   . Claustrophobia     does not like anything put over his face  . NICM (nonischemic cardiomyopathy)     Echo (06/12/14):  Moderate LVH, EF 30-35%, mechanical AVR ok (mean 11 mm Hg), mild LAE, mildly reduced RVSF, mild to moderate RAE, moderate pericardial effusion  . Carotid stenosis     Carotid US (05/31/14):  Bilateral ICA 1-39%  . Hx of cardiac cath     a. LHC (05/15/14):  Ostial circumflex 20%, EF 25-30%  . Pericardial effusion     hemorrhagic after Bentall >>> s/p subxiphoid pericardial window 06/2014       Current Outpatient Prescriptions  Medication Sig Dispense Refill  . acetaminophen (TYLENOL) 500 MG tablet Take 500-2,000 mg by mouth every 6 (six) hours as needed.      Marland Kitchen allopurinol (ZYLOPRIM) 100 MG tablet Take 1 tablet (100 mg total) by mouth daily as needed (for gout).  30 tablet  0  . aspirin EC 81 MG EC tablet Take 1 tablet (81 mg total) by mouth daily.      . colchicine 0.6 MG tablet Take 1 tablet (0.6 mg total) by mouth 2 (two) times daily. Until sxs resolve then stop  30 tablet  0  . HYDROcodone-acetaminophen (NORCO/VICODIN) 5-325 MG per tablet Take 1-2 tablets by mouth  every 6 (six) hours as needed.  20 tablet  0  . metoprolol tartrate (LOPRESSOR) 25 MG tablet Take 12.5 mg by mouth 2 (two) times daily.      Marland Kitchen oxyCODONE (OXY IR/ROXICODONE) 5 MG immediate release tablet Take 1-2 tablets (5-10 mg total) by mouth every 4 (four) hours as needed for severe pain.  30 tablet  0  . predniSONE (DELTASONE) 5 MG tablet Take 20 mg daily for 3 days, followed by 10 mg daily for 3 days, followed by 5 mg daily for 3 days  21 tablet  0  . warfarin (COUMADIN) 2.5 MG tablet Take 2.5 mg by mouth daily at 6 PM. Takes 3 TABS Wednesday - THURSDAY and 2 TABS all other days.       No current facility-administered medications for this visit.    Allergies:   Ivp dye; Morphine and related; and  Shellfish-derived products   Social History:  The patient  reports that he has never smoked. He has never used smokeless tobacco. He reports that he drinks alcohol. He reports that he does not use illicit drugs.   Family History:  The patient's family history includes COPD in his mother; Cancer in his father; Coronary artery disease in his father; Diabetes in his mother; Heart disease in his father; Heart failure in his mother; Hyperlipidemia in his father and mother; Hypertension in his mother. There is no history of Heart attack.   ROS:  Please see the history of present illness.    All other systems reviewed and negative.   PHYSICAL EXAM: VS:  BP 114/80  Pulse 74  Ht 5\' 8"  (1.727 m)  Wt 247 lb (112.038 kg)  BMI 37.56 kg/m2 Well nourished, well developed, in no acute distress HEENT: normal Neck: no JVD Cardiac:  normal S1, mechanical S2; RRR; no murmur Lungs:   clear to auscultation bilaterally, no wheezing, rhonchi or rales Abd: soft, nontender, no hepatomegaly Ext:  no edema Skin: warm and dry Neuro:  CNs 2-12 intact, no focal abnormalities noted   EKG:  NSR, HR 74, LBBB      ASSESSMENT AND PLAN:  Aortic stenosis with ascending aortic aneurysm, s/p Bentall with mechanical AVR:  Progressing very nicely.  Continue SBE prophylaxis.  Continue Coumadin.    NICM (nonischemic cardiomyopathy):  Continue beta blocker. Start Lisinopril 2.5 QD.  Check BMET in 1 week. Plan on FU echo in ~ 3 months.  If EF remains < 35%, refer to EP for ICD.  Disposition:  I spent > 15 minutes completing short term disability paperwork today.  Patient lives in [place] and would like to FU in [place].  Will plan FU there with one of our cardiologists in 4 weeks.     Signed, Versie Starks, MHS 07/26/2014 10:33 AM    [facility] [address], [place], [place]  [address] Phone: [phone]; Fax: [phone]

## 2014-08-01 ENCOUNTER — Ambulatory Visit (INDEPENDENT_AMBULATORY_CARE_PROVIDER_SITE_OTHER): Payer: Self-pay | Admitting: *Deleted

## 2014-08-01 DIAGNOSIS — Z5181 Encounter for therapeutic drug level monitoring: Secondary | ICD-10-CM

## 2014-08-01 DIAGNOSIS — I359 Nonrheumatic aortic valve disorder, unspecified: Secondary | ICD-10-CM

## 2014-08-01 DIAGNOSIS — Z954 Presence of other heart-valve replacement: Secondary | ICD-10-CM

## 2014-08-01 LAB — POCT INR: INR: 1.4

## 2014-08-08 ENCOUNTER — Ambulatory Visit (INDEPENDENT_AMBULATORY_CARE_PROVIDER_SITE_OTHER): Payer: Self-pay | Admitting: *Deleted

## 2014-08-08 DIAGNOSIS — Z954 Presence of other heart-valve replacement: Secondary | ICD-10-CM

## 2014-08-08 DIAGNOSIS — I359 Nonrheumatic aortic valve disorder, unspecified: Secondary | ICD-10-CM

## 2014-08-08 DIAGNOSIS — Z5181 Encounter for therapeutic drug level monitoring: Secondary | ICD-10-CM

## 2014-08-08 LAB — POCT INR: INR: 2.2

## 2014-08-08 MED ORDER — METOPROLOL TARTRATE 25 MG PO TABS: 12.5000 mg | ORAL_TABLET | Freq: Two times a day (BID) | ORAL | Status: DC

## 2014-08-16 ENCOUNTER — Telehealth: Payer: Self-pay | Admitting: Cardiology

## 2014-08-16 NOTE — Telephone Encounter (Signed)
Called to complain about his legs swelling.  Stated that it is worse and painful after he walks a mile or mile and half every night.  This started about two days ago

## 2014-08-17 NOTE — Telephone Encounter (Signed)
Spoke with patient and he c/o left leg, foot and ankle pain and swelling for 3 days. Patient said that his thigh, and buttocks feels numb and feels like its on fire when he tries to exercise. No dizziness, sob or chest pain.  Patient said he doesn't have a PCP. Patient was given an appointment for tomorrow 08/18/14 @1 :10 pm with Jory Sims at the [place] office.

## 2014-08-18 ENCOUNTER — Ambulatory Visit (INDEPENDENT_AMBULATORY_CARE_PROVIDER_SITE_OTHER): Payer: Self-pay | Admitting: Pharmacist

## 2014-08-18 ENCOUNTER — Ambulatory Visit (INDEPENDENT_AMBULATORY_CARE_PROVIDER_SITE_OTHER): Payer: Self-pay | Admitting: Adult Health

## 2014-08-18 ENCOUNTER — Encounter: Payer: Self-pay | Admitting: Adult Health

## 2014-08-18 VITALS — BP 116/60 | HR 92 | Ht 68.0 in | Wt 259.0 lb

## 2014-08-18 DIAGNOSIS — Z5181 Encounter for therapeutic drug level monitoring: Secondary | ICD-10-CM

## 2014-08-18 DIAGNOSIS — I359 Nonrheumatic aortic valve disorder, unspecified: Secondary | ICD-10-CM

## 2014-08-18 DIAGNOSIS — M25562 Pain in left knee: Secondary | ICD-10-CM

## 2014-08-18 DIAGNOSIS — I519 Heart disease, unspecified: Secondary | ICD-10-CM | POA: Insufficient documentation

## 2014-08-18 DIAGNOSIS — M25569 Pain in unspecified knee: Secondary | ICD-10-CM

## 2014-08-18 DIAGNOSIS — I719 Aortic aneurysm of unspecified site, without rupture: Secondary | ICD-10-CM

## 2014-08-18 DIAGNOSIS — Z954 Presence of other heart-valve replacement: Secondary | ICD-10-CM

## 2014-08-18 LAB — POCT INR: INR: 2.6

## 2014-08-18 MED ORDER — WARFARIN SODIUM 5 MG PO TABS
5.0000 mg | ORAL_TABLET | Freq: Every day | ORAL | Status: DC
Start: 2014-08-18 — End: 2014-08-27

## 2014-08-18 NOTE — Patient Instructions (Addendum)
You have been referred to [facility] for your knee swelling    Keep apt with Dr.Branch in [place] as scheduled    Your physician recommends that you continue on your current medications as directed. Please refer to the Current Medication list given to you today.      Thank you for choosing [facility] !

## 2014-08-18 NOTE — Progress Notes (Signed)
Name: Alec Kelly    DOB: [birth year]  Age: 48 y.o.  MR#: [id number]       PCP:  No PCP Per Patient      Insurance: Payor: / No coverage found.  CC:    Chief Complaint  Patient presents with  . Edema    VS Filed Vitals:   08/18/14 1322  BP: 116/60  Pulse: 92  Height: 5\' 8"  (1.727 m)  Weight: 259 lb (117.482 kg)    Weights Current Weight  08/18/14 259 lb (117.482 kg)  07/26/14 247 lb (112.038 kg)  07/22/14 248 lb (112.492 kg)    Blood Pressure  BP Readings from Last 3 Encounters:  08/18/14 116/60  07/26/14 114/80  07/23/14 107/66     Admit date:  (Not on file) Last encounter with RMR:  Visit date not found   Allergy Ivp dye; Morphine and related; and Shellfish-derived products  Current Outpatient Prescriptions  Medication Sig Dispense Refill  . acetaminophen (TYLENOL) 500 MG tablet Take 500-2,000 mg by mouth every 6 (six) hours as needed.      Marland Kitchen allopurinol (ZYLOPRIM) 100 MG tablet Take 1 tablet (100 mg total) by mouth daily as needed (for gout).  30 tablet  0  . aspirin EC 81 MG EC tablet Take 1 tablet (81 mg total) by mouth daily.      Marland Kitchen HYDROcodone-acetaminophen (NORCO/VICODIN) 5-325 MG per tablet Take 1-2 tablets by mouth every 6 (six) hours as needed.  20 tablet  0  . lisinopril (PRINIVIL,ZESTRIL) 2.5 MG tablet Take 1 tablet (2.5 mg total) by mouth daily.  30 tablet  11  . metoprolol tartrate (LOPRESSOR) 25 MG tablet Take 0.5 tablets (12.5 mg total) by mouth 2 (two) times daily.  30 tablet  11  . oxyCODONE (OXY IR/ROXICODONE) 5 MG immediate release tablet Take 1-2 tablets (5-10 mg total) by mouth every 4 (four) hours as needed for severe pain.  30 tablet  0  . warfarin (COUMADIN) 5 MG tablet Take 1-1.5 tablets (5-7.5 mg total) by mouth daily.  45 tablet  3   No current facility-administered medications for this visit.    Discontinued Meds:    Medications Discontinued During This Encounter  Medication Reason  . colchicine 0.6 MG tablet Error  . predniSONE  (DELTASONE) 5 MG tablet Error    Patient Active Problem List   Diagnosis Date Noted  . Encounter for therapeutic drug monitoring 08/01/2014  . Hemorrhagic pericardial effusion 06/27/2014  . Pericardial effusion 06/26/2014  . 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 Thoracic ascending aortic aneurysm 06/02/2014  . Aortic aneurysm 05/18/2014  . Aortic stenosis 05/18/2014  . Aortic valve disease 04/28/2014  . LBBB (left bundle branch block) 04/28/2014  . SOB (shortness of breath) 04/28/2014  . Sigmoid diverticulitis 03/15/2014  . Acute diverticulitis 03/15/2014  . LLQ abdominal pain 03/15/2014  . Leukocytosis 03/15/2014  . Gout 03/15/2014  . Blood in stool 05/13/2013  . Diverticulitis of colon with perforation 04/07/2013  . Obesity, Class III, BMI 40-49.9 (morbid obesity) 04/07/2013  . Esophageal thickening 04/07/2013  . Idiopathic gout of knee 04/06/2013    LABS    Component Value Date/Time   NA 138 06/28/2014 0339   NA 139 06/27/2014 1059   NA 137 06/20/2014 1321   K 4.5 06/28/2014 0339   K 4.5 06/27/2014 1059   K 4.3 06/20/2014 1321   CL 99 06/28/2014 0339   CL 101 06/27/2014 1059   CL 101 06/20/2014 1321  CO2 25 06/28/2014 0339   CO2 23 06/27/2014 1059   CO2 31 06/20/2014 1321   GLUCOSE 97 06/28/2014 0339   GLUCOSE 102* 06/27/2014 1059   GLUCOSE 104* 06/20/2014 1321   BUN 11 06/28/2014 0339   BUN 13 06/27/2014 1059   BUN 14 06/20/2014 1321   CREATININE 0.95 06/28/2014 0339   CREATININE 1.00 06/27/2014 1059   CREATININE 1.2 06/20/2014 1321   CALCIUM 8.7 06/28/2014 0339   CALCIUM 9.1 06/27/2014 1059   CALCIUM 9.1 06/20/2014 1321   GFRNONAA >90 06/28/2014 0339   GFRNONAA 87* 06/27/2014 1059   GFRNONAA 81* 06/06/2014 0315   GFRAA >90 06/28/2014 0339   GFRAA >90 06/27/2014 1059   GFRAA >90 06/06/2014 0315   CMP     Component Value Date/Time   NA 138 06/28/2014 0339   K 4.5 06/28/2014 0339   CL 99 06/28/2014 0339   CO2 25 06/28/2014 0339   GLUCOSE 97 06/28/2014 0339   BUN 11 06/28/2014  0339   CREATININE 0.95 06/28/2014 0339   CALCIUM 8.7 06/28/2014 0339   PROT 7.5 06/27/2014 1059   ALBUMIN 3.6 06/27/2014 1059   AST 42* 06/27/2014 1059   ALT 70* 06/27/2014 1059   ALKPHOS 69 06/27/2014 1059   BILITOT 0.4 06/27/2014 1059   GFRNONAA >90 06/28/2014 0339   GFRAA >90 06/28/2014 0339       Component Value Date/Time   WBC 7.3 06/28/2014 0339   WBC 7.0 06/27/2014 1059   WBC 8.5 06/20/2014 1321   HGB 10.3* 06/28/2014 0339   HGB 11.8* 06/27/2014 1059   HGB 10.9* 06/20/2014 1321   HCT 33.0* 06/28/2014 0339   HCT 37.1* 06/27/2014 1059   HCT 33.4* 06/20/2014 1321   MCV 90.7 06/28/2014 0339   MCV 91.8 06/27/2014 1059   MCV 91.3 06/20/2014 1321    Lipid Panel  No results found for this basename: chol, trig, hdl, cholhdl, vldl, ldlcalc    ABG    Component Value Date/Time   PHART 7.426 06/27/2014 1059   PCO2ART 38.6 06/27/2014 1059   PO2ART 84.6 06/27/2014 1059   HCO3 24.9* 06/27/2014 1059   TCO2 26.1 06/27/2014 1059   ACIDBASEDEF 4.0* 06/03/2014 0417   O2SAT 96.5 06/27/2014 1059     No results found for this basename: TSH   BNP (last 3 results) No results found for this basename: PROBNP,  in the last 8760 hours Cardiac Panel (last 3 results) No results found for this basename: CKTOTAL, CKMB, TROPONINI, RELINDX,  in the last 72 hours  Iron/TIBC/Ferritin/ %Sat No results found for this basename: iron, tibc, ferritin, ironpctsat     EKG Orders placed in visit on 07/26/14  . EKG 12-LEAD     Prior Assessment and Plan Problem List as of 08/18/2014     Cardiovascular and Mediastinum   Aortic valve disease   LBBB (left bundle branch block)   Aortic aneurysm   Aortic stenosis   Thoracic ascending aortic aneurysm   Pericardial effusion   Hemorrhagic pericardial effusion     Digestive   Diverticulitis of colon with perforation   Esophageal thickening   Sigmoid diverticulitis     Other   Idiopathic gout of knee   Obesity, Class III, BMI 40-49.9 (morbid obesity)   Blood in stool    Acute diverticulitis   LLQ abdominal pain   Leukocytosis   Gout   SOB (shortness of breath)   Heart valve replaced by other means   Encounter for therapeutic drug monitoring  Imaging: Dg Knee 2 Views Left  07/23/2014   CLINICAL DATA:  Burning sensation in the right thigh for more than 1 week.  EXAM: LEFT KNEE - 1-2 VIEW  COMPARISON:  None.  FINDINGS: There is no evidence of fracture or dislocation. The joint spaces are preserved. No significant degenerative change is seen; the patellofemoral joint is grossly unremarkable in appearance. A fabella is noted.  A small to moderate knee joint effusion is seen. The visualized soft tissues are otherwise unremarkable in appearance.  IMPRESSION: 1. No evidence of fracture or dislocation. 2. Small to moderate knee joint effusion. Evaluation of the soft tissues of the right thigh is limited on radiograph.   Electronically Signed   By: Garald Balding M.D.   On: 07/23/2014 06:28

## 2014-08-18 NOTE — Progress Notes (Signed)
HPI: Mr. Alec Kelly is 48 y/o patient of Dr. Fransico Him we are seeing today for complaints of edema and leg pain. He has a history of AoV stenosis with ascending thoracic aortic aneuryrsm. He underwent Bentall procedure in June of 2015 with St. Jude AoV. He also has hx of subxiphoid pericardial window for pericardial effusion. Most recent echo demonstrated EF of <35%.  He wishes to now be followed in the [place] office.   He complaints of left knee pain with numbness and tingling in the left thigh, Swelling in the knee is worse with walking. He requests pain medication. He does not have a PCP.   Allergies  Allergen Reactions  . Ivp Dye [Iodinated Diagnostic Agents] Nausea And Vomiting    ORAL LIQUID   . Morphine And Related   . Shellfish-Derived Products     exacerbates gout    Current Outpatient Prescriptions  Medication Sig Dispense Refill  . acetaminophen (TYLENOL) 500 MG tablet Take 500-2,000 mg by mouth every 6 (six) hours as needed.      Marland Kitchen allopurinol (ZYLOPRIM) 100 MG tablet Take 1 tablet (100 mg total) by mouth daily as needed (for gout).  30 tablet  0  . aspirin EC 81 MG EC tablet Take 1 tablet (81 mg total) by mouth daily.      . colchicine 0.6 MG tablet Take 1 tablet (0.6 mg total) by mouth 2 (two) times daily. Until sxs resolve then stop  30 tablet  0  . HYDROcodone-acetaminophen (NORCO/VICODIN) 5-325 MG per tablet Take 1-2 tablets by mouth every 6 (six) hours as needed.  20 tablet  0  . lisinopril (PRINIVIL,ZESTRIL) 2.5 MG tablet Take 1 tablet (2.5 mg total) by mouth daily.  30 tablet  11  . metoprolol tartrate (LOPRESSOR) 25 MG tablet Take 0.5 tablets (12.5 mg total) by mouth 2 (two) times daily.  30 tablet  11  . oxyCODONE (OXY IR/ROXICODONE) 5 MG immediate release tablet Take 1-2 tablets (5-10 mg total) by mouth every 4 (four) hours as needed for severe pain.  30 tablet  0  . predniSONE (DELTASONE) 5 MG tablet Take 20 mg daily for 3 days, followed by 10 mg daily for 3 days,  followed by 5 mg daily for 3 days  21 tablet  0  . warfarin (COUMADIN) 5 MG tablet Take 1-1.5 tablets (5-7.5 mg total) by mouth daily.  45 tablet  3   No current facility-administered medications for this visit.    Past Medical History  Diagnosis Date  . Diverticulitis   . Diverticulitis of sigmoid colon 04/07/2013    With perforation  . Obesity, Class III, BMI 40-49.9 (morbid obesity) 04/07/2013  . 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 Aortic arch aneurysm 2015  . Aortic stenosis 2015    s/p Bentall with mechanical (St Jude AVR) 05/2014  . Personal history of kidney stones   . GERD (gastroesophageal reflux disease)   . Claustrophobia     does not like anything put over his face  . NICM (nonischemic cardiomyopathy)     Echo (06/12/14):  Moderate LVH, EF 30-35%, mechanical AVR ok (mean 11 mm Hg), mild LAE, mildly reduced RVSF, mild to moderate RAE, moderate pericardial effusion  . Carotid stenosis     Carotid US (05/31/14):  Bilateral ICA 1-39%  . Hx of cardiac cath     a. LHC (05/15/14):  Ostial circumflex 20%, EF 25-30%  . Pericardial effusion     hemorrhagic after  Bentall >>> s/p subxiphoid pericardial window 06/2014     Past Surgical History  Procedure Laterality Date  . Tonsillectomy    . Appendectomy      74-81 years old - Dr. Lennie Hummer  . Hernia repair  03/23/1790    umbilical  - At [facility], Dr. Lennie Hummer, Repair of umbilical hernia with mesh.  . Lithotripsy    . Bentall procedure N/A 06/02/2014    Procedure: BENTALL PROCEDURE: 62mm St Jude Valve Conduit ;  Surgeon: Gaye Pollack, MD;  Location: [facility];  Service: Open Heart Surgery;  Laterality: N/A;  . Ascending aortic root replacement N/A 06/02/2014    Procedure: Aortic Arch Replacement with 49mm Hemasheild Graft;  Surgeon: Gaye Pollack, MD;  Location: [facility];  Service: Open Heart Surgery;  Laterality: N/A;  . Intraoperative transesophageal echocardiogram N/A 06/02/2014    Procedure: INTRAOPERATIVE TRANSESOPHAGEAL  ECHOCARDIOGRAM;  Surgeon: Gaye Pollack, MD;  Location[facility] OR;  Service: Open Heart Surgery;  Laterality: N/A;  . Subxyphoid pericardial window N/A 06/27/2014    Procedure: SUBXYPHOID PERICARDIAL WINDOW;  Surgeon: Gaye Pollack, MD;  Location: [facility];  Service: Thoracic;  Laterality: N/A;  . Intraoperative transesophageal echocardiogram N/A 06/27/2014    Procedure: INTRAOPERATIVE TRANSESOPHAGEAL ECHOCARDIOGRAM;  Surgeon: Gaye Pollack, MD;  Location: [facility] OR;  Service: Open Heart Surgery;  Laterality: N/A;    ROS: Review of systems complete and found to be negative unless listed above   PHYSICAL EXAM General: Well developed, well nourished, in no acute distress Head: Eyes PERRLA, No xanthomas.   Normal cephalic and atramatic  Lungs: Clear bilaterally to auscultation and percussion. Heart: HRRR S1 S2, with mechanical click. without MRG.  Pulses are 2+ & equal.            No carotid bruit. No JVD.  No abdominal bruits. No femoral bruits. Abdomen: Bowel sounds are positive, abdomen soft and non-tender without masses or                  Hernia's noted. Msk:  Back normal, normal gait. Normal strength and tone for age. Extremities: No clubbing, cyanosis or edema.  DP +1 Pain with ROM of left knee. No erythema or evidence of cellulitis.  Neuro: Alert and oriented X 3. Psych:  Good affect, responds appropriately   ASSESSMENT AND PLAN

## 2014-08-18 NOTE — Assessment & Plan Note (Signed)
No evidence of CHF or chest pain. He is scheduled to be seen in [place] by Dr. Harl Bowie next couple of weeks Continue coumadin. He has recent INR check this am in [place].

## 2014-08-18 NOTE — Assessment & Plan Note (Signed)
Denies dyspnea, or chest pain. No evidence of CHF on exam. Still having issues with salt. Gaining wt from inactivity. Continue ACE,  May need to consider changing to [facility] on next visit.

## 2014-08-18 NOTE — Assessment & Plan Note (Signed)
He has ongoing complaints of left knee pain. ROM and weight bearing exacerbates. He has had a recent x-ray of left knee on 07/23/2014 which demonstrated no evidence of fx or dislocation, small to moderate knee joint effusion was noted.  He will be referred to orthopedics for further evaluation-Dr. Aline Brochure in [place]. He will follow up in [facility] with cardiology with Dr. Harl Bowie. No pain medications will be provided.

## 2014-08-18 NOTE — Assessment & Plan Note (Signed)
S/P repair.with Bental procedure.

## 2014-08-27 ENCOUNTER — Emergency Department (HOSPITAL_COMMUNITY)
Admission: EM | Admit: 2014-08-27 | Discharge: 2014-08-27 | Disposition: A | Discharge status: Home / Self Care | Payer: Self-pay | Attending: Emergency Medicine | Admitting: Emergency Medicine

## 2014-08-27 ENCOUNTER — Encounter (HOSPITAL_COMMUNITY): Payer: Self-pay | Admitting: Emergency Medicine

## 2014-08-27 ENCOUNTER — Emergency Department (HOSPITAL_COMMUNITY): Payer: Self-pay

## 2014-08-27 DIAGNOSIS — E669 Obesity, unspecified: Secondary | ICD-10-CM | POA: Insufficient documentation

## 2014-08-27 DIAGNOSIS — Z7982 Long term (current) use of aspirin: Secondary | ICD-10-CM | POA: Insufficient documentation

## 2014-08-27 DIAGNOSIS — Z8679 Personal history of other diseases of the circulatory system: Secondary | ICD-10-CM | POA: Insufficient documentation

## 2014-08-27 DIAGNOSIS — Z8719 Personal history of other diseases of the digestive system: Secondary | ICD-10-CM | POA: Insufficient documentation

## 2014-08-27 DIAGNOSIS — M25569 Pain in unspecified knee: Secondary | ICD-10-CM | POA: Insufficient documentation

## 2014-08-27 DIAGNOSIS — Z87442 Personal history of urinary calculi: Secondary | ICD-10-CM | POA: Insufficient documentation

## 2014-08-27 DIAGNOSIS — M10062 Idiopathic gout, left knee: Secondary | ICD-10-CM

## 2014-08-27 DIAGNOSIS — Z8659 Personal history of other mental and behavioral disorders: Secondary | ICD-10-CM | POA: Insufficient documentation

## 2014-08-27 DIAGNOSIS — Z9889 Other specified postprocedural states: Secondary | ICD-10-CM | POA: Insufficient documentation

## 2014-08-27 DIAGNOSIS — M109 Gout, unspecified: Secondary | ICD-10-CM | POA: Insufficient documentation

## 2014-08-27 DIAGNOSIS — Z7901 Long term (current) use of anticoagulants: Secondary | ICD-10-CM | POA: Insufficient documentation

## 2014-08-27 DIAGNOSIS — Z79899 Other long term (current) drug therapy: Secondary | ICD-10-CM | POA: Insufficient documentation

## 2014-08-27 LAB — COMPREHENSIVE METABOLIC PANEL
ALT: 13 U/L (ref 0–53)
AST: 18 U/L (ref 0–37)
Albumin: 4 g/dL (ref 3.5–5.2)
Alkaline Phosphatase: 62 U/L (ref 39–117)
Anion gap: 11 (ref 5–15)
BUN: 16 mg/dL (ref 6–23)
CO2: 26 meq/L (ref 19–32)
CREATININE: 1.05 mg/dL (ref 0.50–1.35)
Calcium: 9.2 mg/dL (ref 8.4–10.5)
Chloride: 97 mEq/L (ref 96–112)
GFR calc Af Amer: >90 mL/min (ref >90)
GFR calc non Af Amer: 82 mL/min — ABNORMAL LOW (ref >90)
Glucose, Bld: 107 mg/dL — ABNORMAL HIGH (ref 70–99)
Potassium: 4.6 mEq/L (ref 3.7–5.3)
SODIUM: 134 meq/L — AB (ref 137–147)
TOTAL PROTEIN: 7.9 g/dL (ref 6.0–8.3)
Total Bilirubin: 0.5 mg/dL (ref 0.3–1.2)

## 2014-08-27 LAB — CBC WITH DIFFERENTIAL/PLATELET
BASOS ABS: 0 10*3/uL (ref 0.0–0.1)
Basophils Relative: 0 % (ref 0–1)
Eosinophils Absolute: 0.1 10*3/uL (ref 0.0–0.7)
Eosinophils Relative: 1 % (ref 0–5)
HCT: 41.5 % (ref 39.0–52.0)
Hemoglobin: 14.2 g/dL (ref 13.0–17.0)
LYMPHS PCT: 12 % (ref 12–46)
Lymphs Abs: 1.4 10*3/uL (ref 0.7–4.0)
MCH: 29.2 pg (ref 26.0–34.0)
MCHC: 34.2 g/dL (ref 30.0–36.0)
MCV: 85.4 fL (ref 78.0–100.0)
Monocytes Absolute: 1 10*3/uL (ref 0.1–1.0)
Monocytes Relative: 8 % (ref 3–12)
Neutro Abs: 9.7 10*3/uL — ABNORMAL HIGH (ref 1.7–7.7)
Neutrophils Relative %: 79 % — ABNORMAL HIGH (ref 43–77)
PLATELETS: 276 10*3/uL (ref 150–400)
RBC: 4.86 MIL/uL (ref 4.22–5.81)
RDW: 14 % (ref 11.5–15.5)
WBC: 12.2 10*3/uL — AB (ref 4.0–10.5)

## 2014-08-27 LAB — PROTIME-INR
INR: 2.05 — AB (ref 0.00–1.49)
PROTHROMBIN TIME: 23.1 s — AB (ref 11.6–15.2)

## 2014-08-27 MED ORDER — OXYCODONE-ACETAMINOPHEN 5-325 MG PO TABS
1.0000 | ORAL_TABLET | Freq: Once | ORAL | Status: AC
  Administered 2014-08-27: 1 via ORAL
  Filled 2014-08-27: qty 1

## 2014-08-27 MED ORDER — LIDOCAINE-EPINEPHRINE (PF) 1 %-1:200000 IJ SOLN
INTRAMUSCULAR | Status: AC
  Filled 2014-08-27: qty 10

## 2014-08-27 MED ORDER — OXYCODONE-ACETAMINOPHEN 5-325 MG PO TABS: 1.0000 | ORAL_TABLET | Freq: Four times a day (QID) | ORAL | Status: DC | PRN

## 2014-08-27 MED ORDER — COLCHICINE 0.6 MG PO TABS: ORAL_TABLET | ORAL | Status: DC

## 2014-08-27 NOTE — Discharge Instructions (Signed)
Follow up with dr. Aline Brochure this week.  Use crutches and keep knee elevated

## 2014-08-27 NOTE — ED Provider Notes (Signed)
CSN: [id number]     Arrival date & time 08/27/14  1450 History   This chart was scribed for Alec Diego, MD, by Neta Ehlers, ED Scribe. This patient was seen in room APA10/APA10 and the patient's care was started at 3:12 PM.  First MD Initiated Contact with Patient 08/27/14 1511     No chief complaint on file.   Patient is a 48 y.o. male presenting with knee pain. The history is provided by the patient and the spouse. No language interpreter was used.  Knee Pain Location:  Knee Time since incident:  3 days Injury: no   Knee location:  L knee Pain details:    Quality:  Unable to specify   Radiates to:  Does not radiate   Severity:  Severe (10/10)   Onset quality:  Unable to specify   Duration:  3 days   Timing:  Constant   Progression:  Worsening Relieved by:  None tried Worsened by:  Bearing weight and extension Ineffective treatments:  None tried Associated symptoms: decreased ROM, numbness and swelling   Risk factors: obesity    HPI Comments: Alec Kelly is a 48 y.o. male who presents to the Emergency Department complaining of left knee pain, onset three days ago, which worsened this morning and which has been associated with swelling. [name] rates the pain as 10/10, and he states difficulty ambulating secondary to the pain. He reports a h/o gout, but he states the present symptoms are dissimilar to previous episodes of gout; he has not taken medication for gout. He was referred to Dr. [name] last week by his cardiologist due to ankle pain.   Past Medical History  Diagnosis Date  . Diverticulitis   . Diverticulitis of sigmoid colon 04/07/2013    With perforation  . Obesity, Class III, BMI 40-49.9 (morbid obesity) 04/07/2013  . 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 Aortic arch aneurysm 2015  . Aortic stenosis 2015    s/p Bentall with mechanical (St Jude AVR) 05/2014  . Personal history of kidney stones   . GERD (gastroesophageal reflux disease)   .  Claustrophobia     does not like anything put over his face  . NICM (nonischemic cardiomyopathy)     Echo (06/12/14):  Moderate LVH, EF 30-35%, mechanical AVR ok (mean 11 mm Hg), mild LAE, mildly reduced RVSF, mild to moderate RAE, moderate pericardial effusion  . Carotid stenosis     Carotid US (05/31/14):  Bilateral ICA 1-39%  . Hx of cardiac cath     a. LHC (05/15/14):  Ostial circumflex 20%, EF 25-30%  . Pericardial effusion     hemorrhagic after Bentall >>> s/p subxiphoid pericardial window 06/2014    Past Surgical History  Procedure Laterality Date  . Tonsillectomy    . Appendectomy      63-51 years old - Dr. Lennie Hummer  . Hernia repair  08/17/4267    umbilical  - At [facility], Dr. Lennie Hummer, Repair of umbilical hernia with mesh.  . Lithotripsy    . Bentall procedure N/A 06/02/2014    Procedure: BENTALL PROCEDURE: 27mm St Jude Valve Conduit ;  Surgeon: Gaye Pollack, MD;  Location: [place];  Service: Open Heart Surgery;  Laterality: N/A;  . Ascending aortic root replacement N/A 06/02/2014    Procedure: Aortic Arch Replacement with 61mm Hemasheild Graft;  Surgeon: Gaye Pollack, MD;  Location: [place];  Service: Open Heart Surgery;  Laterality: N/A;  .  Intraoperative transesophageal echocardiogram N/A 06/02/2014    Procedure: INTRAOPERATIVE TRANSESOPHAGEAL ECHOCARDIOGRAM;  Surgeon: Gaye Pollack, MD;  Location: [facility] OR;  Service: Open Heart Surgery;  Laterality: N/A;  . Subxyphoid pericardial window N/A 06/27/2014    Procedure: SUBXYPHOID PERICARDIAL WINDOW;  Surgeon: Gaye Pollack, MD;  Location: [place];  Service: Thoracic;  Laterality: N/A;  . Intraoperative transesophageal echocardiogram N/A 06/27/2014    Procedure: INTRAOPERATIVE TRANSESOPHAGEAL ECHOCARDIOGRAM;  Surgeon: Gaye Pollack, MD;  Location: [facility] OR;  Service: Open Heart Surgery;  Laterality: N/A;   Family History  Problem Relation Age of Onset  . Heart failure Mother   . Diabetes Mother   . Hypertension Mother   . Hyperlipidemia Mother    . COPD Mother   . Cancer Father     lung & prostate  . Hyperlipidemia Father   . Heart disease Father   . Coronary artery disease Father   . Heart attack Neg Hx    History  Substance Use Topics  . Smoking status: Never Smoker   . Smokeless tobacco: Never Used  . Alcohol Use: Yes     Comment: social    Review of Systems  Musculoskeletal: Positive for arthralgias and joint swelling.  All other systems reviewed and are negative.   Allergies  Ivp dye; Morphine and related; and Shellfish-derived products  Home Medications   Prior to Admission medications   Medication Sig Start Date End Date Taking? Authorizing Provider  acetaminophen (TYLENOL) 500 MG tablet Take 500-2,000 mg by mouth every 6 (six) hours as needed.    Historical Provider, MD  allopurinol (ZYLOPRIM) 100 MG tablet Take 1 tablet (100 mg total) by mouth daily as needed (for gout). 06/13/14   [name], PA-C  aspirin EC 81 MG EC tablet Take 1 tablet (81 mg total) by mouth daily. 06/09/14   [name], PA-C  HYDROcodone-acetaminophen (NORCO/VICODIN) 5-325 MG per tablet Take 1-2 tablets by mouth every 6 (six) hours as needed. 07/23/14   John L Molpus, MD  lisinopril (PRINIVIL,ZESTRIL) 2.5 MG tablet Take 1 tablet (2.5 mg total) by mouth daily. 07/26/14   [name], PA-C  metoprolol tartrate (LOPRESSOR) 25 MG tablet Take 0.5 tablets (12.5 mg total) by mouth 2 (two) times daily. 08/08/14   [name], PA-C  oxyCODONE (OXY IR/ROXICODONE) 5 MG immediate release tablet Take 1-2 tablets (5-10 mg total) by mouth every 4 (four) hours as needed for severe pain. 07/02/14   [name], PA-C  warfarin (COUMADIN) 5 MG tablet Take 1-1.5 tablets (5-7.5 mg total) by mouth daily. 08/18/14   [name], MD   Triage Vitals: BP 125/77  Pulse 85  Temp(Src) 99.5 F (37.5 C)  Resp 18  Ht 5\' 8"  (1.727 m)  Wt 255 lb (115.667 kg)  BMI 38.78 kg/m2  SpO2 100%  Physical Exam  Constitutional: He is oriented to  person, place, and time. He appears well-developed.  HENT:  Head: Normocephalic.  Eyes: Conjunctivae are normal.  Neck: No tracheal deviation present.  Cardiovascular:  No murmur heard. Musculoskeletal: Normal range of motion. He exhibits tenderness.  Left knee severely swollen and tender. Neurovascularly intact.   Neurological: He is oriented to person, place, and time.  Skin: Skin is warm.  Psychiatric: He has a normal mood and affect.    ED Course  Injection of joint Date/Time: 08/27/2014 5:19 PM Performed by: Melodye Swor L Authorized by: Milton Ferguson L Comments: left knee was swollen.  Lido with epi  used to numb left knee.   18 gauge needle used to drain 50cc of yellow fluid from knee   (including critical care time)  DIAGNOSTIC STUDIES: Oxygen Saturation is 100% on room air, normal by my interpretation.    COORDINATION OF CARE:  3:14 PM- Discussed treatment plan with patient, and the patient agreed to the plan. The plan includes an x-ray and lab work.   Labs Review Labs Reviewed  CBC WITH DIFFERENTIAL - Abnormal; Notable for the following:    WBC 12.2 (*)    Neutrophils Relative % 79 (*)    Neutro Abs 9.7 (*)    All other components within normal limits  COMPREHENSIVE METABOLIC PANEL - Abnormal; Notable for the following:    Sodium 134 (*)    Glucose, Bld 107 (*)    GFR calc non Af Amer 82 (*)    All other components within normal limits    Imaging Review No results found.   EKG Interpretation None      MDM   Final diagnoses:  None   Gout with knee effusion.   tx with colchicine and percocet  The chart was scribed for me under my direct supervision.  I personally performed the history, physical, and medical decision making and all procedures in the evaluation of this patient.Alec Diego, MD 08/27/14 [phone]

## 2014-08-27 NOTE — ED Notes (Signed)
Pt c.io left knee pain/swelling x 3 days. Pt states he is unable to ambulate at all today.

## 2014-08-28 ENCOUNTER — Telehealth: Payer: Self-pay | Admitting: Orthopedic Surgery

## 2014-08-28 NOTE — Telephone Encounter (Signed)
Call received from patient, requests appintment, had been to [facility] Emergency Room 08/18/14 for problem of knee pain and swelling.  States was referred to Dr Aline Brochure, however, no Emergency room call for Dr Aline Brochure on that date.  Offered appointment, and patient states he will need to come after the 1st of month, due to self-pay.  Appointment scheduled accordingly.

## 2014-08-29 ENCOUNTER — Ambulatory Visit: Payer: Self-pay | Admitting: Cardiology

## 2014-08-31 ENCOUNTER — Ambulatory Visit: Payer: Self-pay | Admitting: Cardiology

## 2014-09-05 IMAGING — CR DG CHEST 2V
2 series · 2 of 2 positions shown · non-contrast
Comparison: 06/12/2014

CLINICAL DATA: Thoracic ascending aortic aneurysm. Shortness of
breath.

EXAM:
CHEST  2 VIEW

[w chest lat]
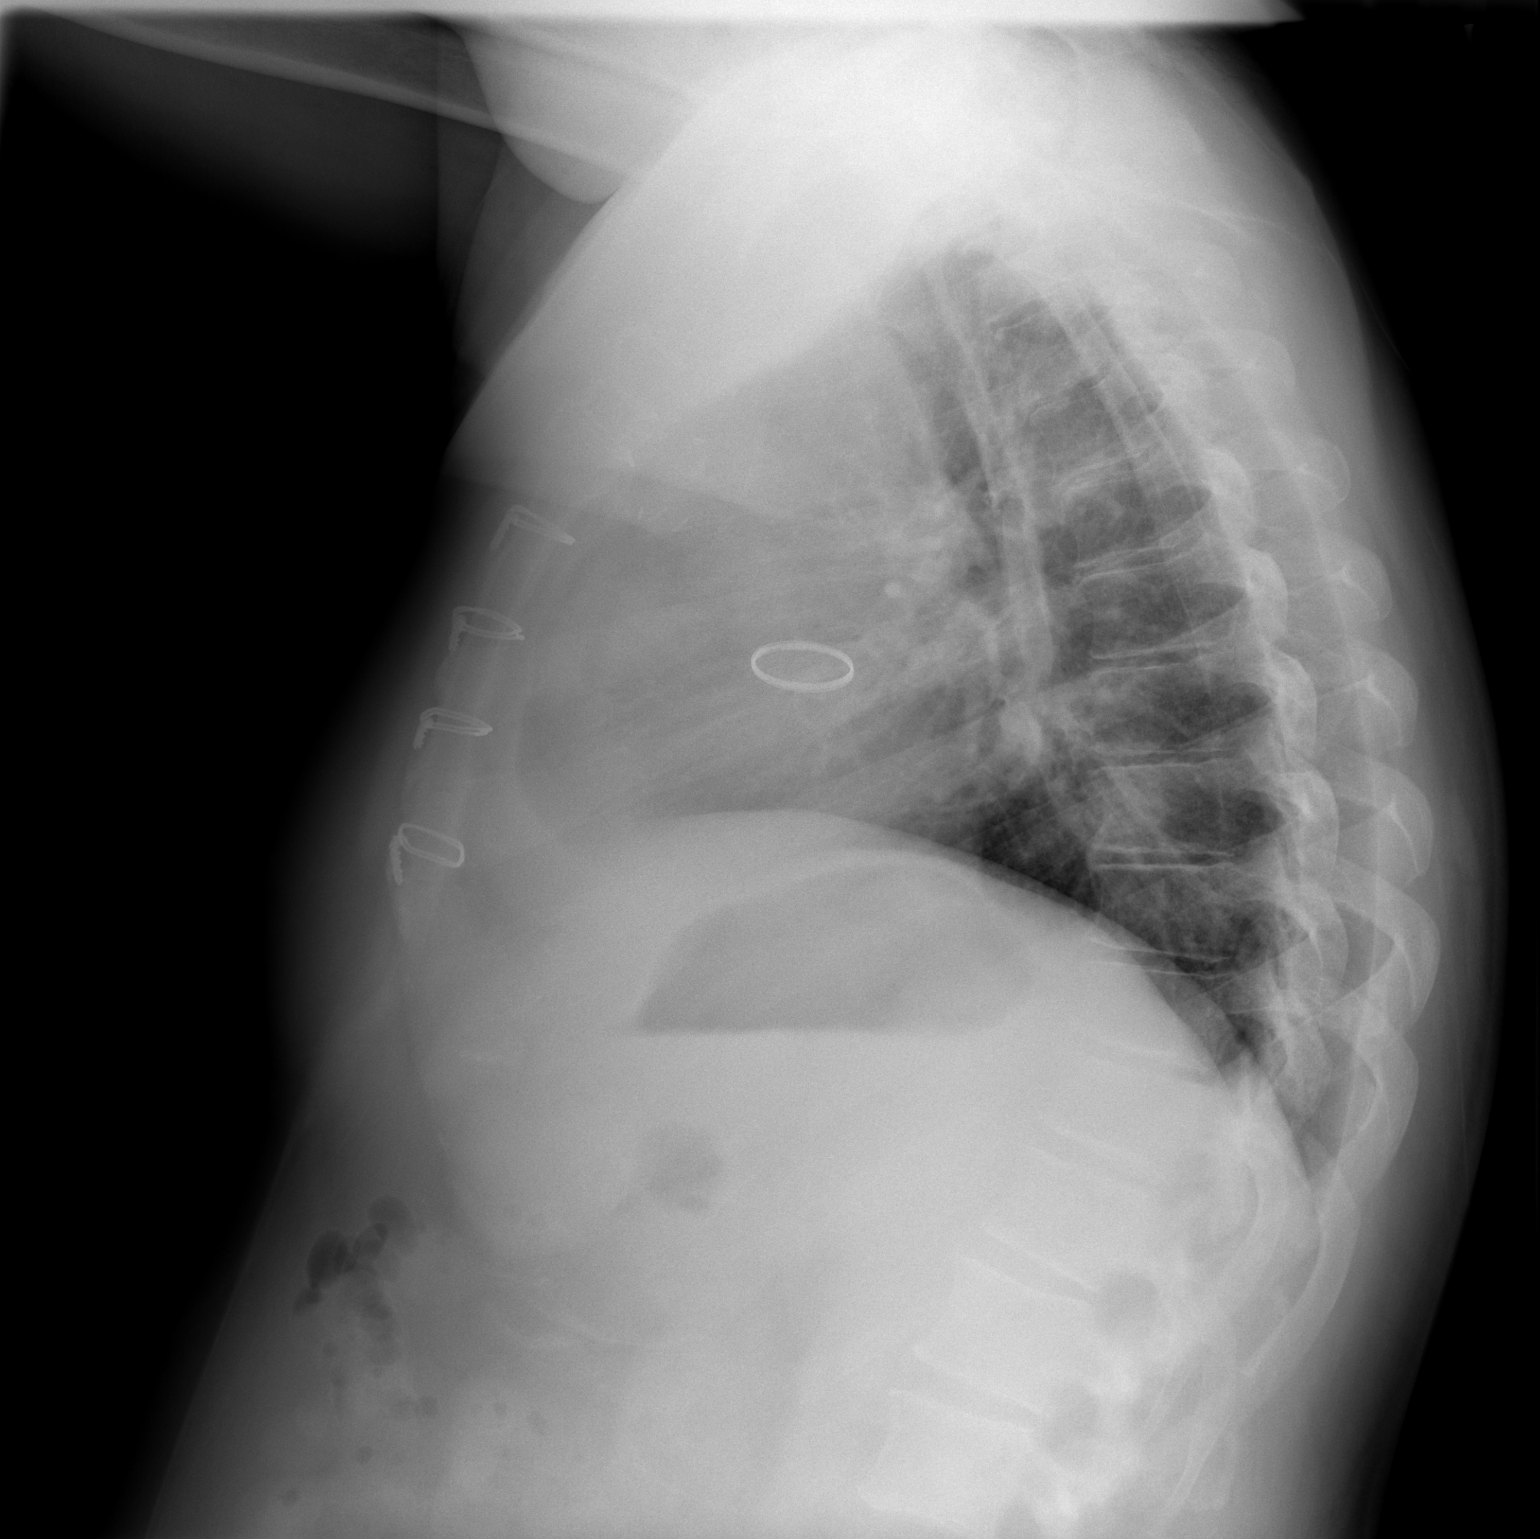

[w chest pa]
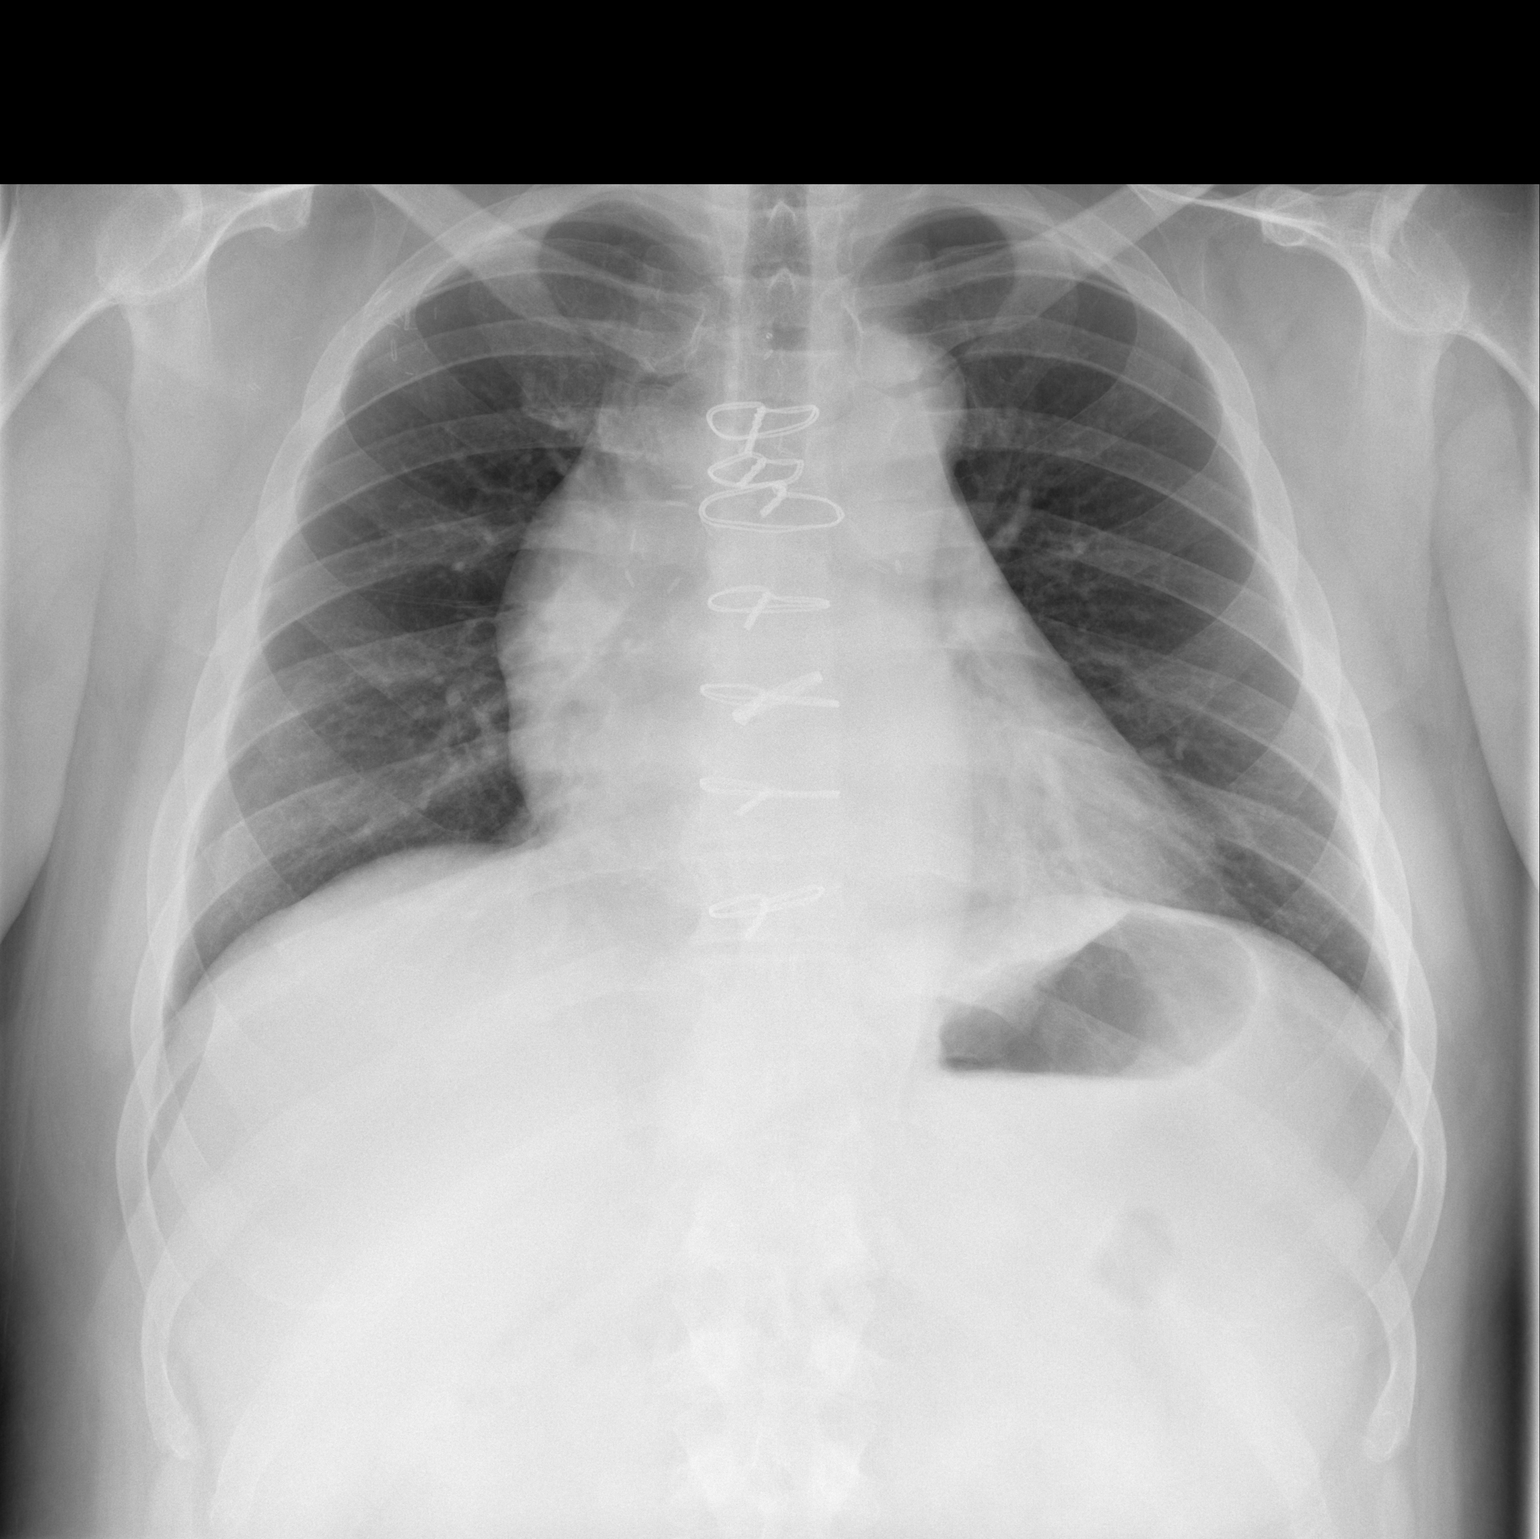

[2 of 2 positions shown; findings below may reference images not displayed]

FINDINGS: Sequelae of prior ascending aortic root replacement are again
identified. Mild enlargement of the cardiac silhouette and ascending
aortic shadow does not appear significantly changed. The lungs are
clear. No pleural effusion or pneumothorax is seen. Surgical clips
are noted in the right axillary/chest wall region. No acute osseous
abnormality is identified.
IMPRESSION: No active cardiopulmonary disease.

## 2014-09-06 ENCOUNTER — Telehealth: Payer: Self-pay | Admitting: *Deleted

## 2014-09-06 NOTE — Telephone Encounter (Signed)
Received labs from [facility] hospital. In Dr. Nelly Laurence folder. To be seen by Dr. Harl Bowie 09/13/14

## 2014-09-11 ENCOUNTER — Encounter: Payer: Self-pay | Admitting: Cardiology

## 2014-09-13 ENCOUNTER — Ambulatory Visit (INDEPENDENT_AMBULATORY_CARE_PROVIDER_SITE_OTHER): Payer: Self-pay | Admitting: *Deleted

## 2014-09-13 ENCOUNTER — Ambulatory Visit (INDEPENDENT_AMBULATORY_CARE_PROVIDER_SITE_OTHER): Payer: Self-pay | Admitting: Cardiology

## 2014-09-13 ENCOUNTER — Encounter: Payer: Self-pay | Admitting: Cardiology

## 2014-09-13 ENCOUNTER — Encounter: Payer: Self-pay | Admitting: *Deleted

## 2014-09-13 VITALS — BP 102/60 | HR 68 | Ht 68.0 in | Wt 263.0 lb

## 2014-09-13 DIAGNOSIS — I3139 Other pericardial effusion (noninflammatory): Secondary | ICD-10-CM

## 2014-09-13 DIAGNOSIS — I319 Disease of pericardium, unspecified: Secondary | ICD-10-CM

## 2014-09-13 DIAGNOSIS — I359 Nonrheumatic aortic valve disorder, unspecified: Secondary | ICD-10-CM

## 2014-09-13 DIAGNOSIS — I313 Pericardial effusion (noninflammatory): Secondary | ICD-10-CM

## 2014-09-13 DIAGNOSIS — I719 Aortic aneurysm of unspecified site, without rupture: Secondary | ICD-10-CM

## 2014-09-13 DIAGNOSIS — Z954 Presence of other heart-valve replacement: Secondary | ICD-10-CM

## 2014-09-13 DIAGNOSIS — I428 Other cardiomyopathies: Secondary | ICD-10-CM

## 2014-09-13 DIAGNOSIS — Z5181 Encounter for therapeutic drug level monitoring: Secondary | ICD-10-CM

## 2014-09-13 DIAGNOSIS — I5022 Chronic systolic (congestive) heart failure: Secondary | ICD-10-CM

## 2014-09-13 LAB — POCT INR: INR: 3.1

## 2014-09-13 MED ORDER — METOPROLOL SUCCINATE ER 25 MG PO TB24
25.0000 mg | ORAL_TABLET | Freq: Every day | ORAL | Status: DC
Start: 2014-09-13 — End: 2014-10-01

## 2014-09-13 NOTE — Progress Notes (Signed)
Clinical Summary Alec Kelly is a 48 y.o.male last seen by NP Purcell Nails, this is our first visit together. He is seen for the following medical problems.  1. History of aortic stenosis and ascending thoracic aneurysm secondary to bicuspid AV - s/p Bental procedure June 02, 2014, received St Jude 25 mm valve - denies any or DOE. Walks regularly, typically about 19min to 1 hr for approx 1 mile. No LE edema, no orthopnea, no PND  2. Chronic systolic HF - echo 06/22/8920 LVEF 25-30%, moderate pericardial effusion - NICM, cath 05/2014 with patent coronaries. Potentially related to severe AS,  - compliant with meds, limiting sodium intake.   3. Pericardial effusion - occurred after Bental procedure - 06/27/14 had subxiphoid pericardial window - denies any SOB, lightheadedness or dizziness   Past Medical History  Diagnosis Date  . Diverticulitis   . Diverticulitis of sigmoid colon 04/07/2013    With perforation  . Obesity, Class III, BMI 40-49.9 (morbid obesity) 04/07/2013  . 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 Aortic arch aneurysm 2015  . Aortic stenosis 2015    s/p Bentall with mechanical (St Jude AVR) 05/2014  . Personal history of kidney stones   . GERD (gastroesophageal reflux disease)   . Claustrophobia     does not like anything put over his face  . NICM (nonischemic cardiomyopathy)     Echo (06/12/14):  Moderate LVH, EF 30-35%, mechanical AVR ok (mean 11 mm Hg), mild LAE, mildly reduced RVSF, mild to moderate RAE, moderate pericardial effusion  . Carotid stenosis     Carotid US (05/31/14):  Bilateral ICA 1-39%  . Hx of cardiac cath     a. LHC (05/15/14):  Ostial circumflex 20%, EF 25-30%  . Pericardial effusion     hemorrhagic after Bentall >>> s/p subxiphoid pericardial window 06/2014      Allergies  Allergen Reactions  . Ivp Dye [Iodinated Diagnostic Agents] Nausea And Vomiting    ORAL LIQUID   . Morphine And Related   . Shellfish-Derived Products     exacerbates  gout     Current Outpatient Prescriptions  Medication Sig Dispense Refill  . acetaminophen (TYLENOL) 500 MG tablet Take 500-2,000 mg by mouth every 6 (six) hours as needed (pain).       Marland Kitchen allopurinol (ZYLOPRIM) 100 MG tablet Take 1 tablet (100 mg total) by mouth daily as needed (for gout).  30 tablet  0  . aspirin EC 81 MG EC tablet Take 1 tablet (81 mg total) by mouth daily.      . colchicine 0.6 MG tablet Take 2 tablets initially then take one every hour until pain improved or diarrhea or stomach problems start.  Do not take more then 6 tablets a day  20 tablet  0  . HYDROcodone-acetaminophen (NORCO/VICODIN) 5-325 MG per tablet Take 1-2 tablets by mouth every 6 (six) hours as needed.  20 tablet  0  . lisinopril (PRINIVIL,ZESTRIL) 2.5 MG tablet Take 1 tablet (2.5 mg total) by mouth daily.  30 tablet  11  . metoprolol tartrate (LOPRESSOR) 25 MG tablet Take 0.5 tablets (12.5 mg total) by mouth 2 (two) times daily.  30 tablet  11  . oxyCODONE-acetaminophen (PERCOCET/ROXICET) 5-325 MG per tablet Take 1 tablet by mouth every 6 (six) hours as needed.  20 tablet  0  . warfarin (COUMADIN) 5 MG tablet Take 5-7.5 mg by mouth daily. Patient takes 5mg  daily except on Tues.,Thurs.,Sat.,Sun. Patient takes 7.5mg   No current facility-administered medications for this visit.     Past Surgical History  Procedure Laterality Date  . Tonsillectomy    . Appendectomy      72-62 years old - Dr. Lennie Hummer  . Hernia repair  01/23/9370    umbilical  - At [facility], Dr. Lennie Hummer, Repair of umbilical hernia with mesh.  . Lithotripsy    . Bentall procedure N/A 06/02/2014    Procedure: BENTALL PROCEDURE: 38mm St Jude Valve Conduit ;  Surgeon: Gaye Pollack, MD;  Location: [place];  Service: Open Heart Surgery;  Laterality: N/A;  . Ascending aortic root replacement N/A 06/02/2014    Procedure: Aortic Arch Replacement with 79mm Hemasheild Graft;  Surgeon: Gaye Pollack, MD;  Location: [place];  Service: Open Heart Surgery;   Laterality: N/A;  . Intraoperative transesophageal echocardiogram N/A 06/02/2014    Procedure: INTRAOPERATIVE TRANSESOPHAGEAL ECHOCARDIOGRAM;  Surgeon: Gaye Pollack, MD;  Location: [facility] OR;  Service: Open Heart Surgery;  Laterality: N/A;  . Subxyphoid pericardial window N/A 06/27/2014    Procedure: SUBXYPHOID PERICARDIAL WINDOW;  Surgeon: Gaye Pollack, MD;  Location: [place];  Service: Thoracic;  Laterality: N/A;  . Intraoperative transesophageal echocardiogram N/A 06/27/2014    Procedure: INTRAOPERATIVE TRANSESOPHAGEAL ECHOCARDIOGRAM;  Surgeon: Gaye Pollack, MD;  Location[facility] OR;  Service: Open Heart Surgery;  Laterality: N/A;     Allergies  Allergen Reactions  . Ivp Dye [Iodinated Diagnostic Agents] Nausea And Vomiting    ORAL LIQUID   . Morphine And Related   . Shellfish-Derived Products     exacerbates gout      Family History  Problem Relation Age of Onset  . Heart failure Mother   . Diabetes Mother   . Hypertension Mother   . Hyperlipidemia Mother   . COPD Mother   . Cancer Father     lung & prostate  . Hyperlipidemia Father   . Heart disease Father   . Coronary artery disease Father   . Heart attack Neg Hx      Social History Mr. Mondo reports that he has never smoked. He has never used smokeless tobacco. Mr. Littler reports that he drinks alcohol.   Review of Systems CONSTITUTIONAL: No weight loss, fever, chills, weakness or fatigue.  HEENT: Eyes: No visual loss, blurred vision, double vision or yellow sclerae.No hearing loss, sneezing, congestion, runny nose or sore throat.  SKIN: No rash or itching.  CARDIOVASCULAR: per HPI RESPIRATORY: No shortness of breath, cough or sputum.  GASTROINTESTINAL: No anorexia, nausea, vomiting or diarrhea. No abdominal pain or blood.  GENITOURINARY: No burning on urination, no polyuria NEUROLOGICAL: No headache, dizziness, syncope, paralysis, ataxia, numbness or tingling in the extremities. No change in bowel or bladder  control.  MUSCULOSKELETAL: No muscle, back pain, joint pain or stiffness.  LYMPHATICS: No enlarged nodes. No history of splenectomy.  PSYCHIATRIC: No history of depression or anxiety.  ENDOCRINOLOGIC: No reports of sweating, cold or heat intolerance. No polyuria or polydipsia.  Marland Kitchen   Physical Examination p 68 bp 102/60 Wt 263 lbs BMI 40 Gen: resting comfortably, no acute distress HEENT: no scleral icterus, pupils equal round and reactive, no palptable cervical adenopathy,  CV: RRR, mechanical S2, no JVD, no carotid bruits Resp: Clear to auscultation bilaterally GI: abdomen is soft, non-tender, non-distended, normal bowel sounds, no hepatosplenomegaly MSK: extremities are warm, no edema.  Skin: warm, no rash Neuro:  no focal deficits Psych: appropriate affect   Diagnostic Studies 06/2014 Echo Study Conclusions  -  Left ventricle: Systolic function was severely reduced. The estimated ejection fraction was in the range of 25% to 30%. - Pericardium, extracardiac: A moderate to large, free-flowing pericardial effusion was identified. There was no evidence of hemodynamic compromise.   05/2014 Cath Procedural Findings:  Hemodynamics  RA 24/22 mean 20 mm Hg  RV 47/16 mm Hg  PA 41/31 mean 35 mm Hg  PCWP 27/25 mean 25 mm Hg  LV 164/24 mm Hg  AO 106/77 mean 95 mm Hg  AV gradient: Peak-58 mm Hg, mean 27 mm Hg  AV area- 1.5 cm squared with index 0.6.  Oxygen saturations:  PA 71%  AO 98%  Cardiac Output (Fick) 6.0 L/min  Cardiac Index (Fick) 2.57 L/min/meter squared.  Coronary angiography:  Coronary dominance: left  Left mainstem: Normal  Left anterior descending (LAD): Normal  Left circumflex (LCx): 20% ostial disease. Otherwise normal. Large dominant vessel.  Right coronary artery (RCA): small nondominant vessel. Normal.  Left ventriculography: Left ventricular systolic function is abnormal, LVEF is estimated at 25-30%. The LV is enlarged with global hypokinesis. There is no  significant mitral regurgitation  Aortic root angiography: The AV is heavily calcified and deformed with reduced mobility. 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  Final Conclusions:  1. Normal coronary anatomy.  2. Severe LV dysfunction.  3. Severe aortic stenosis.  4. Proximal aortic root aneurysm.  5. Mild pulmonary HTN with elevated LV filling pressures.  Recommendations: Referral to CT surgery for Bentall procedure.     Assessment and Plan   1. History of aortic stenosis and thoracic aneurysm - s/p Bentall procedure, he as recovered from surgery very well - denies any significant symptoms, doing well on coumadin - continue current meds  2. Pericardial effusion - postop effusion after Bentall procedure, s/p subxiphoid window - denies any current symptoms, continue to follow  3. Chronic systolic HF/NICM - LVEF 70-35% by echo 06/2014, recent cath with patent coronaries - etiology may be related to severe AS, vs NICM (genetic, postinfectoius etc) - will change lopressor to Toprol XL 25mg  daily, fairly low bp and HR so medication titration may be limited - once optimzed on medical therapy will need repeat echo, possible consideration for ICD    From cardiac standpoint he is cleared to return to work  F/u 3 weeks for further optimization of CHF medications   [name], M.D.

## 2014-09-13 NOTE — Patient Instructions (Signed)
Your physician recommends that you schedule a follow-up appointment in: 3-4 weeks with Dr. Harl Bowie  Your physician has recommended you make the following change in your medication:   STOP LOPRESSOR  START TOPOROL XL 25 MG DAILY  I have given you a return to work letter  Thank you for choosing [facility]!!

## 2014-09-17 ENCOUNTER — Encounter (HOSPITAL_COMMUNITY): Payer: Self-pay | Admitting: Emergency Medicine

## 2014-09-17 ENCOUNTER — Emergency Department (HOSPITAL_COMMUNITY)
Admission: EM | Admit: 2014-09-17 | Discharge: 2014-09-17 | Disposition: A | Discharge status: Home / Self Care | Payer: MEDICAID | Attending: Emergency Medicine | Admitting: Emergency Medicine

## 2014-09-17 DIAGNOSIS — Z8679 Personal history of other diseases of the circulatory system: Secondary | ICD-10-CM | POA: Insufficient documentation

## 2014-09-17 DIAGNOSIS — Z79899 Other long term (current) drug therapy: Secondary | ICD-10-CM | POA: Insufficient documentation

## 2014-09-17 DIAGNOSIS — Z7982 Long term (current) use of aspirin: Secondary | ICD-10-CM | POA: Insufficient documentation

## 2014-09-17 DIAGNOSIS — Z8719 Personal history of other diseases of the digestive system: Secondary | ICD-10-CM | POA: Insufficient documentation

## 2014-09-17 DIAGNOSIS — Z9889 Other specified postprocedural states: Secondary | ICD-10-CM | POA: Insufficient documentation

## 2014-09-17 DIAGNOSIS — Z87442 Personal history of urinary calculi: Secondary | ICD-10-CM | POA: Insufficient documentation

## 2014-09-17 DIAGNOSIS — M10062 Idiopathic gout, left knee: Secondary | ICD-10-CM | POA: Insufficient documentation

## 2014-09-17 DIAGNOSIS — M10021 Idiopathic gout, right elbow: Secondary | ICD-10-CM | POA: Insufficient documentation

## 2014-09-17 DIAGNOSIS — Z7901 Long term (current) use of anticoagulants: Secondary | ICD-10-CM | POA: Insufficient documentation

## 2014-09-17 DIAGNOSIS — M109 Gout, unspecified: Secondary | ICD-10-CM

## 2014-09-17 LAB — CBC WITH DIFFERENTIAL/PLATELET
Basophils Absolute: 0 10*3/uL (ref 0.0–0.1)
Basophils Relative: 0 % (ref 0–1)
EOS ABS: 0.1 10*3/uL (ref 0.0–0.7)
Eosinophils Relative: 1 % (ref 0–5)
HCT: 37.5 % — ABNORMAL LOW (ref 39.0–52.0)
Hemoglobin: 12.8 g/dL — ABNORMAL LOW (ref 13.0–17.0)
LYMPHS ABS: 1.7 10*3/uL (ref 0.7–4.0)
Lymphocytes Relative: 17 % (ref 12–46)
MCH: 29.2 pg (ref 26.0–34.0)
MCHC: 34.1 g/dL (ref 30.0–36.0)
MCV: 85.4 fL (ref 78.0–100.0)
MONOS PCT: 10 % (ref 3–12)
Monocytes Absolute: 0.9 10*3/uL (ref 0.1–1.0)
NEUTROS ABS: 7.1 10*3/uL (ref 1.7–7.7)
Neutrophils Relative %: 72 % (ref 43–77)
PLATELETS: 260 10*3/uL (ref 150–400)
RBC: 4.39 MIL/uL (ref 4.22–5.81)
RDW: 14.6 % (ref 11.5–15.5)
WBC: 9.9 10*3/uL (ref 4.0–10.5)

## 2014-09-17 LAB — BASIC METABOLIC PANEL
ANION GAP: 11 (ref 5–15)
BUN: 13 mg/dL (ref 6–23)
CHLORIDE: 102 meq/L (ref 96–112)
CO2: 25 mEq/L (ref 19–32)
CREATININE: 0.94 mg/dL (ref 0.50–1.35)
Calcium: 8.6 mg/dL (ref 8.4–10.5)
GFR calc non Af Amer: >90 mL/min (ref >90)
Glucose, Bld: 113 mg/dL — ABNORMAL HIGH (ref 70–99)
Potassium: 4.3 mEq/L (ref 3.7–5.3)
Sodium: 138 mEq/L (ref 137–147)

## 2014-09-17 LAB — PROTIME-INR
INR: 2.39 — AB (ref 0.00–1.49)
PROTHROMBIN TIME: 26.1 s — AB (ref 11.6–15.2)

## 2014-09-17 MED ORDER — OXYCODONE-ACETAMINOPHEN 5-325 MG PO TABS: 1.0000 | ORAL_TABLET | ORAL | Status: DC | PRN

## 2014-09-17 MED ORDER — COLCHICINE 0.6 MG PO TABS: 0.6000 mg | ORAL_TABLET | Freq: Every day | ORAL | Status: DC

## 2014-09-17 MED ORDER — OXYCODONE-ACETAMINOPHEN 5-325 MG PO TABS
2.0000 | ORAL_TABLET | Freq: Once | ORAL | Status: AC
  Administered 2014-09-17: 2 via ORAL
  Filled 2014-09-17: qty 2

## 2014-09-17 NOTE — ED Notes (Signed)
Pt c/o left leg pain and right shoulder pain x 2 days; pt denies any falls

## 2014-09-17 NOTE — Discharge Instructions (Signed)

## 2014-09-17 NOTE — ED Provider Notes (Signed)
CSN: [id number]     Arrival date & time 09/17/14  1903 History   First MD Initiated Contact with Patient 09/17/14 1941     Chief Complaint  Patient presents with  . Leg Pain  . Arm Pain     (Consider location/radiation/quality/duration/timing/severity/associated sxs/prior Treatment) Patient is a 47 y.o. male presenting with leg pain and arm pain.  Leg Pain Location:  Knee Knee location:  L knee Pain details:    Quality:  Aching   Radiates to:  Does not radiate   Severity:  Severe   Onset quality:  Gradual   Duration:  2 days   Timing:  Constant   Progression:  Worsening Chronicity:  Recurrent Relieved by:  Nothing Worsened by:  Bearing weight, flexion and extension Associated symptoms: decreased ROM (2/2 pain), stiffness and swelling   Associated symptoms: no back pain, no fever, no muscle weakness and no numbness   Arm Pain This is a recurrent problem. The current episode started 2 days ago. The problem occurs constantly. The problem has been gradually worsening. Pertinent negatives include no chest pain, no abdominal pain, no headaches and no shortness of breath.    Past Medical History  Diagnosis Date  . Diverticulitis   . Diverticulitis of sigmoid colon 04/07/2013    With perforation  . Obesity, Class III, BMI 40-49.9 (morbid obesity) 04/07/2013  . 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 Aortic arch aneurysm 2015  . Aortic stenosis 2015    s/p Bentall with mechanical (St Jude AVR) 05/2014  . Personal history of kidney stones   . GERD (gastroesophageal reflux disease)   . Claustrophobia     does not like anything put over his face  . NICM (nonischemic cardiomyopathy)     Echo (06/12/14):  Moderate LVH, EF 30-35%, mechanical AVR ok (mean 11 mm Hg), mild LAE, mildly reduced RVSF, mild to moderate RAE, moderate pericardial effusion  . Carotid stenosis     Carotid US (05/31/14):  Bilateral ICA 1-39%  . Hx of cardiac cath     a. LHC (05/15/14):  Ostial circumflex 20%, EF  25-30%  . Pericardial effusion     hemorrhagic after Bentall >>> s/p subxiphoid pericardial window 06/2014    Past Surgical History  Procedure Laterality Date  . Tonsillectomy    . Appendectomy      30-66 years old - Dr. Lennie Hummer  . Hernia repair  0/12/930    umbilical  - At [facility], Dr. Lennie Hummer, Repair of umbilical hernia with mesh.  . Lithotripsy    . Bentall procedure N/A 06/02/2014    Procedure: BENTALL PROCEDURE: 75mm St Jude Valve Conduit ;  Surgeon: Gaye Pollack, MD;  Location: [place];  Service: Open Heart Surgery;  Laterality: N/A;  . Ascending aortic root replacement N/A 06/02/2014    Procedure: Aortic Arch Replacement with 20mm Hemasheild Graft;  Surgeon: Gaye Pollack, MD;  Location: [place];  Service: Open Heart Surgery;  Laterality: N/A;  . Intraoperative transesophageal echocardiogram N/A 06/02/2014    Procedure: INTRAOPERATIVE TRANSESOPHAGEAL ECHOCARDIOGRAM;  Surgeon: Gaye Pollack, MD;  Location: [facility] OR;  Service: Open Heart Surgery;  Laterality: N/A;  . Subxyphoid pericardial window N/A 06/27/2014    Procedure: SUBXYPHOID PERICARDIAL WINDOW;  Surgeon: Gaye Pollack, MD;  Location: [place];  Service: Thoracic;  Laterality: N/A;  . Intraoperative transesophageal echocardiogram N/A 06/27/2014    Procedure: INTRAOPERATIVE TRANSESOPHAGEAL ECHOCARDIOGRAM;  Surgeon: Gaye Pollack, MD;  Location: [facility] OR;  Service: Open Heart Surgery;  Laterality: N/A;   Family History  Problem Relation Age of Onset  . Heart failure Mother   . Diabetes Mother   . Hypertension Mother   . Hyperlipidemia Mother   . COPD Mother   . Cancer Father     lung & prostate  . Hyperlipidemia Father   . Heart disease Father   . Coronary artery disease Father   . Heart attack Neg Hx    History  Substance Use Topics  . Smoking status: Never Smoker   . Smokeless tobacco: Never Used  . Alcohol Use: Yes     Comment: social    Review of Systems  Constitutional: Negative for fever.  Respiratory: Negative for  shortness of breath.   Cardiovascular: Negative for chest pain.  Gastrointestinal: Negative for abdominal pain.  Musculoskeletal: Positive for stiffness. Negative for back pain.  Neurological: Negative for headaches.  All other systems reviewed and are negative.     Allergies  Ivp dye; Morphine and related; and Shellfish-derived products  Home Medications   Prior to Admission medications   Medication Sig Start Date End Date Taking? Authorizing Provider  acetaminophen (TYLENOL) 500 MG tablet Take 500-2,000 mg by mouth every 6 (six) hours as needed (pain).    Yes Historical Provider, MD  allopurinol (ZYLOPRIM) 100 MG tablet Take 1 tablet (100 mg total) by mouth daily as needed (for gout). 06/13/14  Yes [name], PA-C  aspirin EC 81 MG EC tablet Take 1 tablet (81 mg total) by mouth daily. 06/09/14  Yes [name], PA-C  colchicine 0.6 MG tablet Take 2 tablets initially then take one every hour until pain improved or diarrhea or stomach problems start.  Do not take more then 6 tablets a day 08/27/14  Yes Maudry Diego, MD  HYDROcodone-acetaminophen (NORCO/VICODIN) 5-325 MG per tablet Take 1-2 tablets by mouth every 6 (six) hours as needed for moderate pain. 07/23/14  Yes John L Molpus, MD  metoprolol succinate (TOPROL XL) 25 MG 24 hr tablet Take 1 tablet (25 mg total) by mouth daily. 09/13/14  Yes [name], MD  warfarin (COUMADIN) 5 MG tablet Take 5-7.5 mg by mouth daily. Patient takes 7.5mg  daily except on Mon, Wed, and Fri when patient takes 5 mg   Yes Historical Provider, MD  colchicine 0.6 MG tablet Take 1 tablet (0.6 mg total) by mouth daily. 09/17/14   Debby Freiberg, MD  oxyCODONE-acetaminophen (PERCOCET/ROXICET) 5-325 MG per tablet Take 1-2 tablets by mouth every 4 (four) hours as needed for severe pain. 09/17/14   Debby Freiberg, MD  oxyCODONE-acetaminophen (PERCOCET/ROXICET) 5-325 MG per tablet Take 1-2 tablets by mouth every 4 (four) hours as needed for  moderate pain. 09/17/14   Debby Freiberg, MD   BP 107/63  Pulse 88  Temp(Src) 99.2 F (37.3 C) (Oral)  Resp 20  Ht 5\' 8"  (1.727 m)  Wt 263 lb (119.296 kg)  BMI 40.00 kg/m2  SpO2 98% Physical Exam  Vitals reviewed. Constitutional: He is oriented to person, place, and time. He appears well-developed and well-nourished.  HENT:  Head: Normocephalic and atraumatic.  Eyes: Conjunctivae and EOM are normal.  Neck: Normal range of motion. Neck supple.  Cardiovascular: Normal rate, regular rhythm and normal heart sounds.   Pulmonary/Chest: Effort normal and breath sounds normal. No respiratory distress.  Abdominal: He exhibits no distension. There is no tenderness. There is no rebound and no guarding.  Musculoskeletal: Normal range of motion.  Swelling of L  knee and R elbow with warmth, no erythema.  FROM with pain.  Pulses/sensation intact  Neurological: He is alert and oriented to person, place, and time.  Skin: Skin is warm and dry.    ED Course  Procedures (including critical care time) Labs Review Labs Reviewed  CBC WITH DIFFERENTIAL - Abnormal; Notable for the following:    Hemoglobin 12.8 (*)    HCT 37.5 (*)    All other components within normal limits  PROTIME-INR - Abnormal; Notable for the following:    Prothrombin Time 26.1 (*)    INR 2.39 (*)    All other components within normal limits  BASIC METABOLIC PANEL - Abnormal; Notable for the following:    Glucose, Bld 113 (*)    All other components within normal limits    Imaging Review No results found.   EKG Interpretation None      MDM   Final diagnoses:  Acute gout of left knee, unspecified cause  Acute gout of right elbow, unspecified cause    48 y.o. male with pertinent PMH of gout, aortic stenosis sp valve replacement, nephrolithiasis presents with recurrent gout symptoms of L knee and R elbow.  No fevers, systemic symptoms.  Pt is on coumadin for aortic valve.  Labs as above with nonsupratherapeutic  INR.  Likely etiology of symptoms given recurrent nature and appearance is gout flare.  Doubt septic arthritis, other etiology given nature of symptoms.  DC home in stable condition.   1. Acute gout of left knee, unspecified cause   2. Acute gout of right elbow, unspecified cause         Debby Freiberg, MD 09/17/14 2232

## 2014-09-18 ENCOUNTER — Encounter: Payer: Self-pay | Admitting: Orthopedic Surgery

## 2014-09-18 ENCOUNTER — Ambulatory Visit: Payer: Self-pay | Admitting: Orthopedic Surgery

## 2014-09-25 MED FILL — Oxycodone w/ Acetaminophen Tab 5-325 MG: ORAL | Qty: 6 | Status: AC

## 2014-09-28 IMAGING — CR DG CHEST 2V
2 series · 2 of 2 positions shown · non-contrast
Comparison: 06/28/2014

CLINICAL DATA: Pericardial window performed on 06/27/2014 for
pericardial effusion.

EXAM:
CHEST  2 VIEW

[w chest pa]
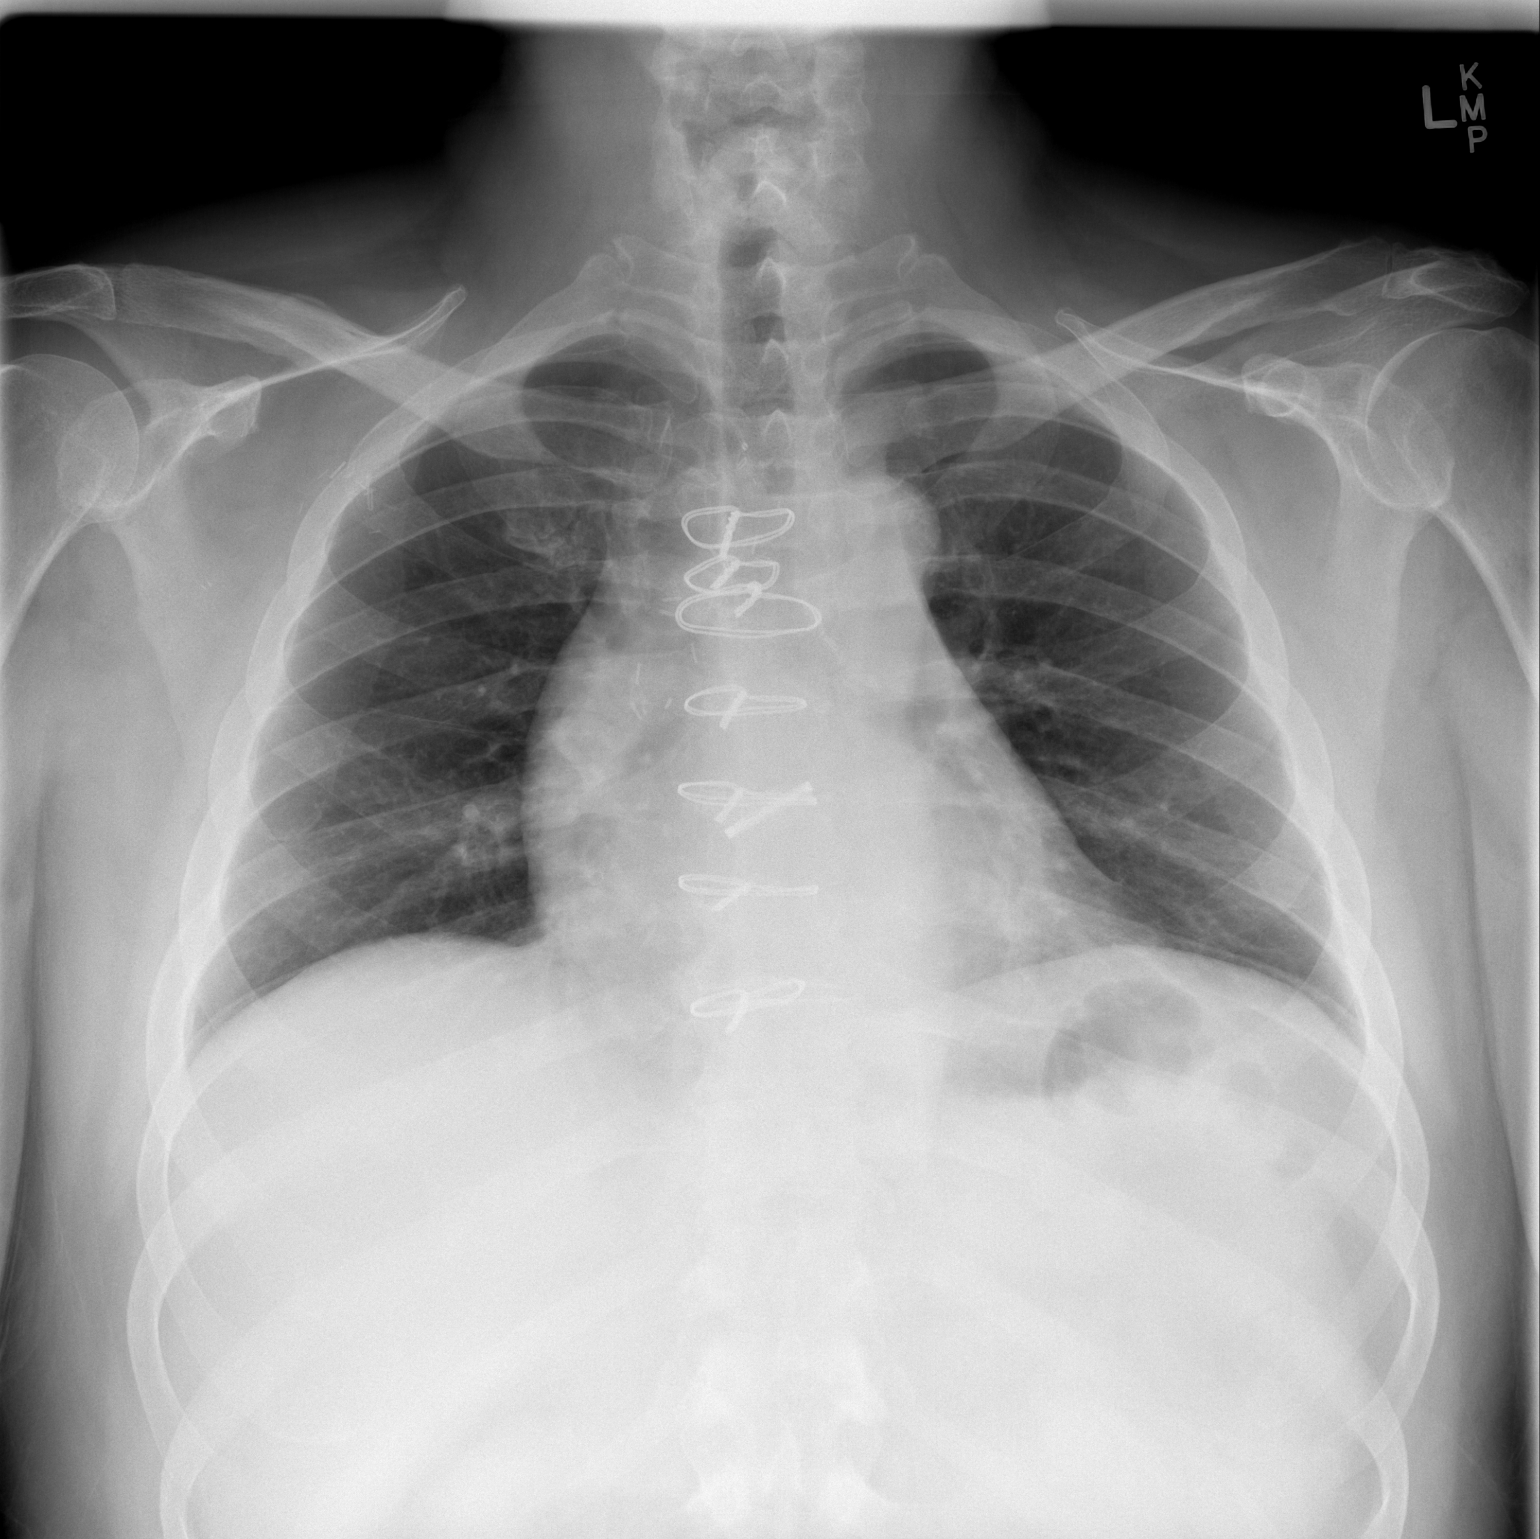

[w chest lat]
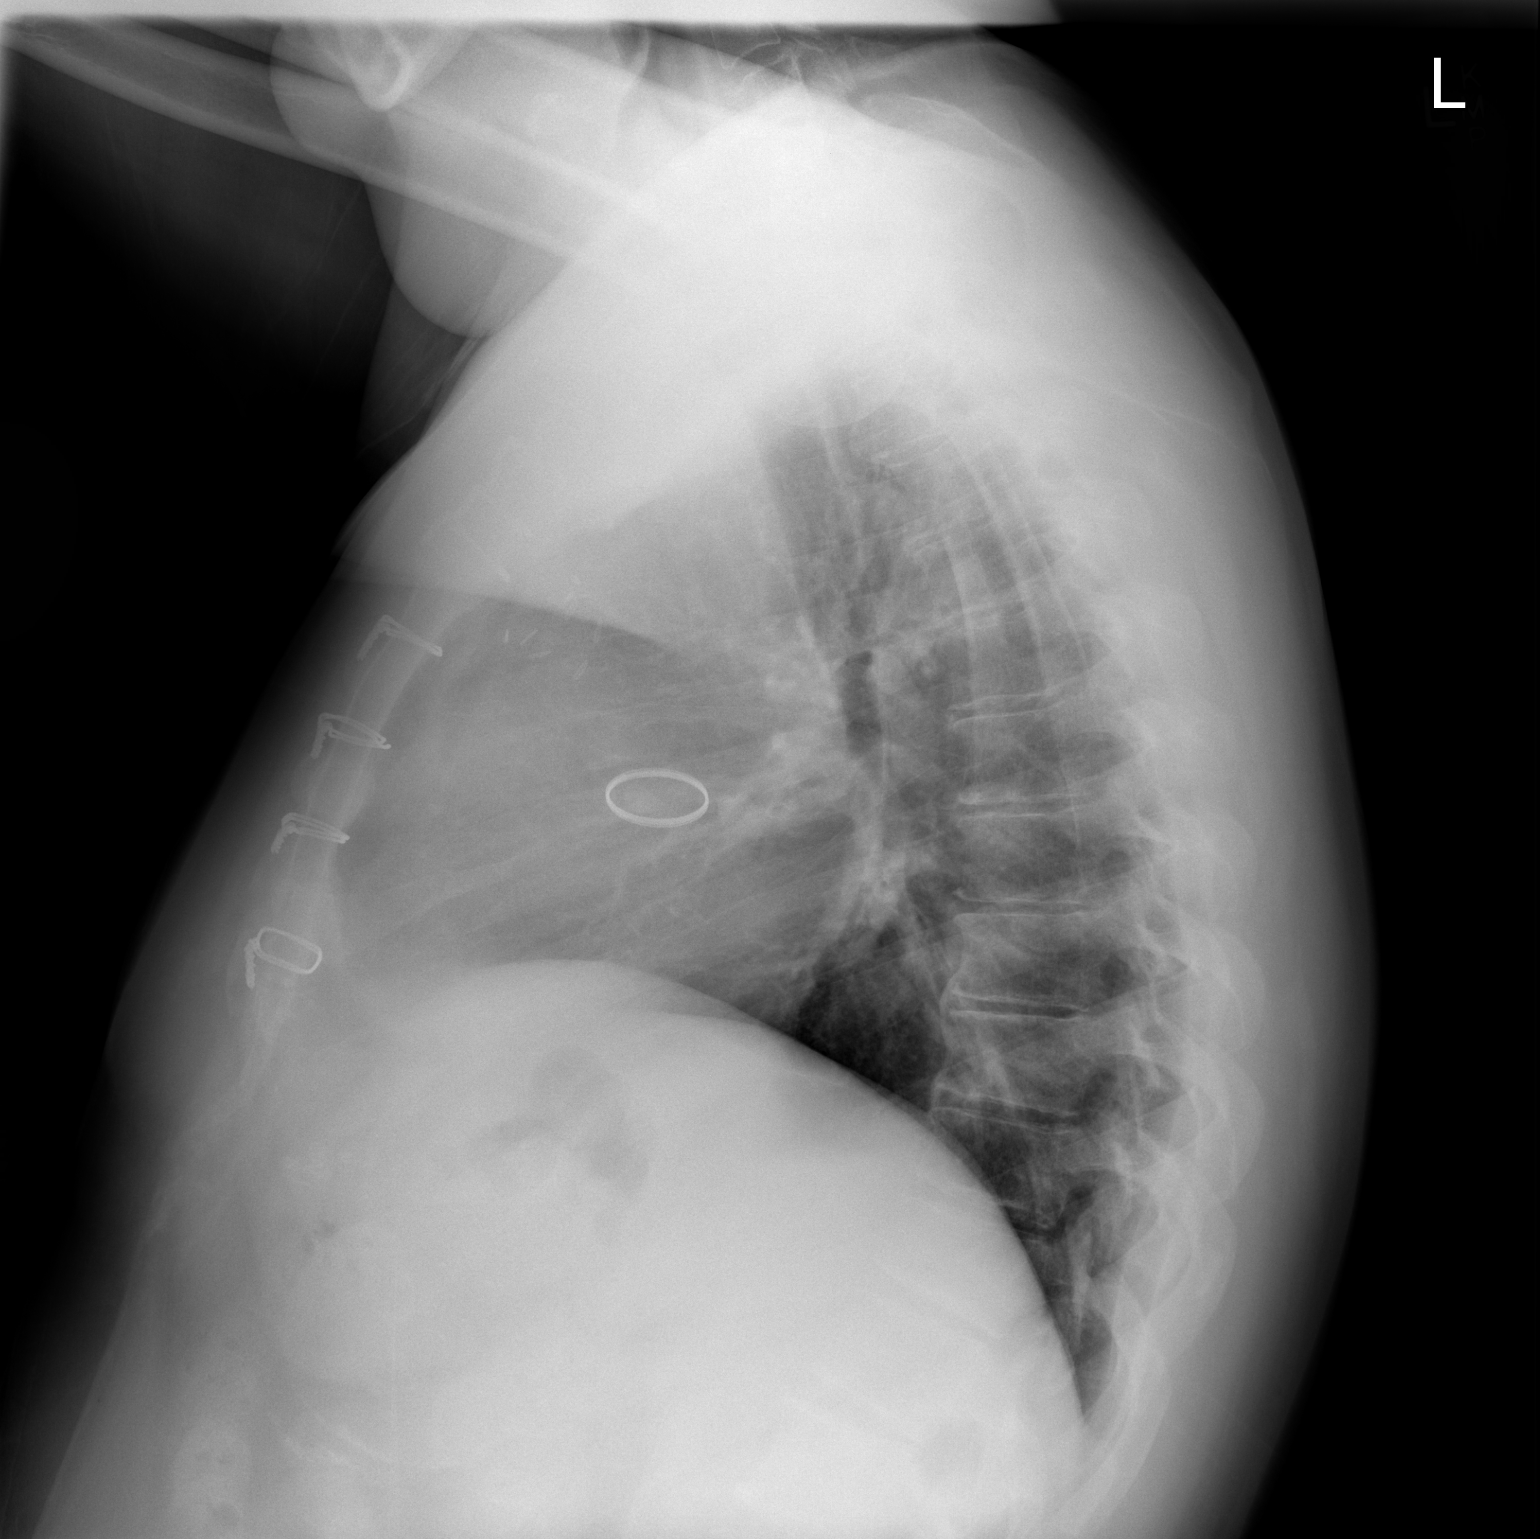

[2 of 2 positions shown; findings below may reference images not displayed]

FINDINGS: Cardiopericardial silhouette is normal in size, decreased from the
prior study.

Normal mediastinal and hilar contours. Stable changes from previous
cardiac surgery and valve replacement.

Clear lungs.  No pleural effusion or pneumothorax.

Stable changes from right breast surgery.

Bony thorax is intact.
IMPRESSION: No acute cardiopulmonary disease.  No cardiopericardial enlargement.

## 2014-10-01 ENCOUNTER — Encounter (HOSPITAL_COMMUNITY): Payer: Self-pay | Admitting: Emergency Medicine

## 2014-10-01 ENCOUNTER — Emergency Department (HOSPITAL_COMMUNITY): Payer: Self-pay

## 2014-10-01 ENCOUNTER — Emergency Department (HOSPITAL_COMMUNITY)
Admission: EM | Admit: 2014-10-01 | Discharge: 2014-10-01 | Disposition: A | Discharge status: Home / Self Care | Payer: Self-pay | Attending: Emergency Medicine | Admitting: Emergency Medicine

## 2014-10-01 DIAGNOSIS — Z9089 Acquired absence of other organs: Secondary | ICD-10-CM | POA: Insufficient documentation

## 2014-10-01 DIAGNOSIS — I714 Abdominal aortic aneurysm, without rupture, unspecified: Secondary | ICD-10-CM

## 2014-10-01 DIAGNOSIS — Z9889 Other specified postprocedural states: Secondary | ICD-10-CM | POA: Insufficient documentation

## 2014-10-01 DIAGNOSIS — Z7901 Long term (current) use of anticoagulants: Secondary | ICD-10-CM | POA: Insufficient documentation

## 2014-10-01 DIAGNOSIS — K639 Disease of intestine, unspecified: Secondary | ICD-10-CM

## 2014-10-01 DIAGNOSIS — Z7982 Long term (current) use of aspirin: Secondary | ICD-10-CM | POA: Insufficient documentation

## 2014-10-01 DIAGNOSIS — R109 Unspecified abdominal pain: Secondary | ICD-10-CM | POA: Insufficient documentation

## 2014-10-01 DIAGNOSIS — Z79899 Other long term (current) drug therapy: Secondary | ICD-10-CM | POA: Insufficient documentation

## 2014-10-01 DIAGNOSIS — Z87442 Personal history of urinary calculi: Secondary | ICD-10-CM | POA: Insufficient documentation

## 2014-10-01 DIAGNOSIS — Z8659 Personal history of other mental and behavioral disorders: Secondary | ICD-10-CM | POA: Insufficient documentation

## 2014-10-01 DIAGNOSIS — I429 Cardiomyopathy, unspecified: Secondary | ICD-10-CM | POA: Insufficient documentation

## 2014-10-01 DIAGNOSIS — M549 Dorsalgia, unspecified: Secondary | ICD-10-CM | POA: Insufficient documentation

## 2014-10-01 DIAGNOSIS — K6389 Other specified diseases of intestine: Secondary | ICD-10-CM | POA: Insufficient documentation

## 2014-10-01 LAB — COMPREHENSIVE METABOLIC PANEL
ALBUMIN: 3.8 g/dL (ref 3.5–5.2)
ALK PHOS: 63 U/L (ref 39–117)
ALT: 10 U/L (ref 0–53)
AST: 13 U/L (ref 0–37)
Anion gap: 9 (ref 5–15)
BUN: 13 mg/dL (ref 6–23)
CHLORIDE: 103 meq/L (ref 96–112)
CO2: 29 mEq/L (ref 19–32)
Calcium: 9.2 mg/dL (ref 8.4–10.5)
Creatinine, Ser: 1.04 mg/dL (ref 0.50–1.35)
GFR calc Af Amer: >90 mL/min (ref >90)
GFR calc non Af Amer: 83 mL/min — ABNORMAL LOW (ref >90)
Glucose, Bld: 85 mg/dL (ref 70–99)
POTASSIUM: 3.9 meq/L (ref 3.7–5.3)
SODIUM: 141 meq/L (ref 137–147)
Total Bilirubin: 0.4 mg/dL (ref 0.3–1.2)
Total Protein: 7.4 g/dL (ref 6.0–8.3)

## 2014-10-01 LAB — CBC
HCT: 38.9 % — ABNORMAL LOW (ref 39.0–52.0)
HEMOGLOBIN: 13.3 g/dL (ref 13.0–17.0)
MCH: 29.1 pg (ref 26.0–34.0)
MCHC: 34.2 g/dL (ref 30.0–36.0)
MCV: 85.1 fL (ref 78.0–100.0)
Platelets: 221 10*3/uL (ref 150–400)
RBC: 4.57 MIL/uL (ref 4.22–5.81)
RDW: 14.3 % (ref 11.5–15.5)
WBC: 6.3 10*3/uL (ref 4.0–10.5)

## 2014-10-01 LAB — URINALYSIS, ROUTINE W REFLEX MICROSCOPIC
BILIRUBIN URINE: NEGATIVE
GLUCOSE, UA: NEGATIVE mg/dL
HGB URINE DIPSTICK: NEGATIVE
Ketones, ur: NEGATIVE mg/dL
Leukocytes, UA: NEGATIVE
Nitrite: NEGATIVE
PH: 6 (ref 5.0–8.0)
Protein, ur: NEGATIVE mg/dL
SPECIFIC GRAVITY, URINE: 1.02 (ref 1.005–1.030)
Urobilinogen, UA: 0.2 mg/dL (ref 0.0–1.0)

## 2014-10-01 MED ORDER — SODIUM CHLORIDE 0.9 % IV BOLUS (SEPSIS)
1000.0000 mL | Freq: Once | INTRAVENOUS | Status: AC
  Administered 2014-10-01: 1000 mL via INTRAVENOUS

## 2014-10-01 MED ORDER — HYDROCODONE-ACETAMINOPHEN 5-325 MG PO TABS: 1.0000 | ORAL_TABLET | ORAL | Status: DC | PRN

## 2014-10-01 MED ORDER — HYDROCODONE-ACETAMINOPHEN 5-325 MG PO TABS
2.0000 | ORAL_TABLET | Freq: Once | ORAL | Status: AC
  Administered 2014-10-01: 2 via ORAL
  Filled 2014-10-01: qty 2

## 2014-10-01 NOTE — Discharge Instructions (Signed)
If you were given medicines take as directed.  If you are on coumadin or contraceptives realize their levels and effectiveness is altered by many different medicines.  If you have any reaction (rash, tongues swelling, other) to the medicines stop taking and see a physician.   Followup with your heart specialist and primary Dr. to discuss thickening of your sigmoid colon, you will need a colonoscopy by gastroenterologist in the near future to look for signs of cancer.  Please follow up as directed and return to the ER or see a physician for new or worsening symptoms.  Thank you. Filed Vitals:   10/01/14 1437  BP: 118/77  Pulse: 65  Temp: 99.1 F (37.3 C)  TempSrc: Oral  Resp: 18  Height: 5\' 8"  (1.727 m)  Weight: 255 lb (115.667 kg)  SpO2: 98%

## 2014-10-01 NOTE — ED Notes (Signed)
PT reports sudden onset of right flank pain x1 day. PT denies any urinary symptoms, injury, or SOB.

## 2014-10-01 NOTE — ED Provider Notes (Signed)
CSN: [id number]     Arrival date & time 10/01/14  1420 History   First MD Initiated Contact with Patient 10/01/14 1530     Chief Complaint  Patient presents with  . Flank Pain     (Consider location/radiation/quality/duration/timing/severity/associated sxs/prior Treatment) HPI Comments: 48 year old male with history of gout, diverticulitis, CAD, aortic stenosis, aortic aneurysm presents with sudden onset right flank pain for one day. No injuries, patient has been working more recently and may have strained his back. No specific radiation, worse with movement. No urinary symptoms fevers or chills. Different than previous pain. Patient is on Coumadin for cardiac reasons. No chest pain or shortness of breath, no abdominal pain or vomiting. Deep ache sensation.  Patient is a 48 y.o. male presenting with flank pain. The history is provided by the patient.  Flank Pain This is a new problem. Pertinent negatives include no chest pain, no abdominal pain, no headaches and no shortness of breath.    Past Medical History  Diagnosis Date  . Diverticulitis   . Diverticulitis of sigmoid colon 04/07/2013    With perforation  . Obesity, Class III, BMI 40-49.9 (morbid obesity) 04/07/2013  . 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 Aortic arch aneurysm 2015  . Aortic stenosis 2015    s/p Bentall with mechanical (St Jude AVR) 05/2014  . Personal history of kidney stones   . GERD (gastroesophageal reflux disease)   . Claustrophobia     does not like anything put over his face  . NICM (nonischemic cardiomyopathy)     Echo (06/12/14):  Moderate LVH, EF 30-35%, mechanical AVR ok (mean 11 mm Hg), mild LAE, mildly reduced RVSF, mild to moderate RAE, moderate pericardial effusion  . Carotid stenosis     Carotid US (05/31/14):  Bilateral ICA 1-39%  . Hx of cardiac cath     a. LHC (05/15/14):  Ostial circumflex 20%, EF 25-30%  . Pericardial effusion     hemorrhagic after Bentall >>> s/p subxiphoid pericardial  window 06/2014    Past Surgical History  Procedure Laterality Date  . Tonsillectomy    . Appendectomy      60-88 years old - Dr. Lennie Hummer  . Hernia repair  0/07/6577    umbilical  - At [facility], Dr. Lennie Hummer, Repair of umbilical hernia with mesh.  . Lithotripsy    . Bentall procedure N/A 06/02/2014    Procedure: BENTALL PROCEDURE: 22mm St Jude Valve Conduit ;  Surgeon: Gaye Pollack, MD;  Location: [place];  Service: Open Heart Surgery;  Laterality: N/A;  . Ascending aortic root replacement N/A 06/02/2014    Procedure: Aortic Arch Replacement with 63mm Hemasheild Graft;  Surgeon: Gaye Pollack, MD;  Location: [place];  Service: Open Heart Surgery;  Laterality: N/A;  . Intraoperative transesophageal echocardiogram N/A 06/02/2014    Procedure: INTRAOPERATIVE TRANSESOPHAGEAL ECHOCARDIOGRAM;  Surgeon: Gaye Pollack, MD;  Location: [facility] OR;  Service: Open Heart Surgery;  Laterality: N/A;  . Subxyphoid pericardial window N/A 06/27/2014    Procedure: SUBXYPHOID PERICARDIAL WINDOW;  Surgeon: Gaye Pollack, MD;  Location: [place];  Service: Thoracic;  Laterality: N/A;  . Intraoperative transesophageal echocardiogram N/A 06/27/2014    Procedure: INTRAOPERATIVE TRANSESOPHAGEAL ECHOCARDIOGRAM;  Surgeon: Gaye Pollack, MD;  Location: [facility] OR;  Service: Open Heart Surgery;  Laterality: N/A;   Family History  Problem Relation Age of Onset  . Heart failure Mother   . Diabetes Mother   . Hypertension Mother   .  Hyperlipidemia Mother   . COPD Mother   . Cancer Father     lung & prostate  . Hyperlipidemia Father   . Heart disease Father   . Coronary artery disease Father   . Heart attack Neg Hx    History  Substance Use Topics  . Smoking status: Never Smoker   . Smokeless tobacco: Never Used  . Alcohol Use: No     Comment: social    Review of Systems  Constitutional: Negative for fever and chills.  HENT: Negative for congestion.   Eyes: Negative for visual disturbance.  Respiratory: Negative for  shortness of breath.   Cardiovascular: Negative for chest pain.  Gastrointestinal: Negative for vomiting and abdominal pain.  Genitourinary: Positive for flank pain. Negative for dysuria.  Musculoskeletal: Negative for back pain, neck pain and neck stiffness.  Skin: Negative for rash.  Neurological: Negative for light-headedness and headaches.      Allergies  Ivp dye; Morphine and related; and Shellfish-derived products  Home Medications   Prior to Admission medications   Medication Sig Start Date End Date Taking? Authorizing Provider  allopurinol (ZYLOPRIM) 100 MG tablet Take 1 tablet (100 mg total) by mouth daily as needed (for gout). 06/13/14  Yes [name], PA-C  aspirin EC 81 MG EC tablet Take 1 tablet (81 mg total) by mouth daily. 06/09/14  Yes [name], PA-C  colchicine 0.6 MG tablet Take 1 tablet (0.6 mg total) by mouth daily. 09/17/14  Yes Debby Freiberg, MD  HYDROcodone-acetaminophen (NORCO/VICODIN) 5-325 MG per tablet Take 1-2 tablets by mouth every 6 (six) hours as needed for moderate pain. 07/23/14  Yes John L Molpus, MD  metoprolol succinate (TOPROL-XL) 25 MG 24 hr tablet Take 25 mg by mouth daily.   Yes Historical Provider, MD  warfarin (COUMADIN) 5 MG tablet Take 5-7.5 mg by mouth daily. Patient takes 7.5mg  daily except on Mon, Wed, and Fri when patient takes 5 mg   Yes Historical Provider, MD  HYDROcodone-acetaminophen (NORCO) 5-325 MG per tablet Take 1-2 tablets by mouth every 4 (four) hours as needed. 10/01/14   Mariea Clonts, MD  oxyCODONE-acetaminophen (PERCOCET/ROXICET) 5-325 MG per tablet Take 1-2 tablets by mouth every 4 (four) hours as needed for severe pain. 09/17/14   Debby Freiberg, MD   BP 118/77  Pulse 65  Temp(Src) 99.1 F (37.3 C) (Oral)  Resp 18  Ht 5\' 8"  (1.727 m)  Wt 255 lb (115.667 kg)  BMI 38.78 kg/m2  SpO2 98% Physical Exam  Nursing note and vitals reviewed. Constitutional: He is oriented to person, place, and time. He  appears well-developed and well-nourished.  HENT:  Head: Normocephalic and atraumatic.  Eyes: Conjunctivae are normal. Right eye exhibits no discharge. Left eye exhibits no discharge.  Neck: Normal range of motion. Neck supple. No tracheal deviation present.  Cardiovascular: Normal rate and regular rhythm.   Pulmonary/Chest: Effort normal and breath sounds normal.  Abdominal: Soft. He exhibits no distension. There is no tenderness. There is no guarding.  Musculoskeletal: He exhibits tenderness (right paraspinal upper lumbar and lower thoracic right flank). He exhibits no edema.  Neurological: He is alert and oriented to person, place, and time.  Skin: Skin is warm. No rash noted.  Psychiatric: He has a normal mood and affect.    ED Course  Procedures (including critical care time) Labs Review Labs Reviewed  CBC - Abnormal; Notable for the following:    HCT 38.9 (*)    All other components within  normal limits  COMPREHENSIVE METABOLIC PANEL - Abnormal; Notable for the following:    GFR calc non Af Amer 83 (*)    All other components within normal limits  URINALYSIS, ROUTINE W REFLEX MICROSCOPIC    Imaging Review Ct Abdomen Pelvis Wo Contrast  10/01/2014   CLINICAL DATA:  48 year old male with acute right side back and flank pain x1 day. Personal history of aortic valve replacement in June.  EXAM: CT CHEST, ABDOMEN AND PELVIS WITHOUT CONTRAST  TECHNIQUE: Multidetector CT imaging of the chest, abdomen and pelvis was performed following the standard protocol without IV contrast.  COMPARISON:  Chest CTA 05/18/2014. CT Abdomen and Pelvis 04/02/2014  FINDINGS: CT CHEST FINDINGS  New median sternotomy wires since June. Mild regression of cardiomegaly. No pericardial or pleural effusion. Multiple small mediastinal surgical clips. Hyperdense circumferential band at the ascending aorta corresponding to the most dilated segments of the aorta on 05/18/2014. No mediastinal hematoma. That segment of  the aorta now measures up to 35-36 mm diameter (versus 42 mm in June). Prosthetic aortic valve also now in place. No mediastinal or hilar lymphadenopathy.  Subcentimeter right thyroid nodule, no followup indicated. Otherwise negative thoracic inlet. Major airways are patent. Stable occasional tiny subpleural nodules which most likely are postinflammatory. Otherwise the lungs are clear.  There is persistent wall thickening of the distal esophagus up to 9 mm thickness (series 2, image 39).  No acute osseous abnormality identified.  CT ABDOMEN AND PELVIS FINDINGS  No acute osseous abnormality identified.  No pelvic free fluid.  Decompressed bladder.  Negative rectum.  Persistent wall thickening in the mid sigmoid with indistinctness of the sigmoid wall and increased number of small mesenteric lymph nodes. Nodes measure up to 7-8 mm short axis aunt are stable. No extraluminal gas. There are diverticula throughout this region. Trace free fluid in this region. No organized or drainable fluid collection.  The descending colon has a more normal appearance. Negative transverse colon. Negative right colon. No dilated small bowel. Decompressed stomach. No pneumoperitoneum.  Negative duodenum. Negative non contrast liver, gallbladder, spleen, pancreas, and adrenal glands. Left nephrolithiasis. No left hydronephrosis. Negative right kidney. No hydroureter or periureteral stranding. No abdominal free fluid. Mild Aortoiliac calcified atherosclerosis noted.  IMPRESSION: 1. Postoperative changes to the mediastinum and proximal aorta with decreased ascending aorta caliber to 35-36 mm (versus 42 mm preoperatively). 2. No other acute findings in the chest. Chronic wall thickening of the distal esophagus up to 9 mm. Favor chronic esophagitis and recommend EGD if not already done. 3. Persistent inflammatory changes about the mid sigmoid colon which could be recurrent sigmoid diverticulitis, however, follow-up is recommended to exclude  sigmoid neoplasm which can have a similar appearance and presentation. Mild mesenteric lymphadenopathy in the region is stable since April. Trace free fluid with no organized or drainable fluid collection. 4. Chronic nephrolithiasis.   Electronically Signed   By: Lars Pinks M.D.   On: 10/01/2014 17:53   Ct Chest Wo Contrast  10/01/2014   CLINICAL DATA:  48 year old male with acute right side back and flank pain x1 day. Personal history of aortic valve replacement in June.  EXAM: CT CHEST, ABDOMEN AND PELVIS WITHOUT CONTRAST  TECHNIQUE: Multidetector CT imaging of the chest, abdomen and pelvis was performed following the standard protocol without IV contrast.  COMPARISON:  Chest CTA 05/18/2014. CT Abdomen and Pelvis 04/02/2014  FINDINGS: CT CHEST FINDINGS  New median sternotomy wires since June. Mild regression of cardiomegaly. No pericardial or pleural effusion. Multiple  small mediastinal surgical clips. Hyperdense circumferential band at the ascending aorta corresponding to the most dilated segments of the aorta on 05/18/2014. No mediastinal hematoma. That segment of the aorta now measures up to 35-36 mm diameter (versus 42 mm in June). Prosthetic aortic valve also now in place. No mediastinal or hilar lymphadenopathy.  Subcentimeter right thyroid nodule, no followup indicated. Otherwise negative thoracic inlet. Major airways are patent. Stable occasional tiny subpleural nodules which most likely are postinflammatory. Otherwise the lungs are clear.  There is persistent wall thickening of the distal esophagus up to 9 mm thickness (series 2, image 39).  No acute osseous abnormality identified.  CT ABDOMEN AND PELVIS FINDINGS  No acute osseous abnormality identified.  No pelvic free fluid.  Decompressed bladder.  Negative rectum.  Persistent wall thickening in the mid sigmoid with indistinctness of the sigmoid wall and increased number of small mesenteric lymph nodes. Nodes measure up to 7-8 mm short axis aunt are  stable. No extraluminal gas. There are diverticula throughout this region. Trace free fluid in this region. No organized or drainable fluid collection.  The descending colon has a more normal appearance. Negative transverse colon. Negative right colon. No dilated small bowel. Decompressed stomach. No pneumoperitoneum.  Negative duodenum. Negative non contrast liver, gallbladder, spleen, pancreas, and adrenal glands. Left nephrolithiasis. No left hydronephrosis. Negative right kidney. No hydroureter or periureteral stranding. No abdominal free fluid. Mild Aortoiliac calcified atherosclerosis noted.  IMPRESSION: 1. Postoperative changes to the mediastinum and proximal aorta with decreased ascending aorta caliber to 35-36 mm (versus 42 mm preoperatively). 2. No other acute findings in the chest. Chronic wall thickening of the distal esophagus up to 9 mm. Favor chronic esophagitis and recommend EGD if not already done. 3. Persistent inflammatory changes about the mid sigmoid colon which could be recurrent sigmoid diverticulitis, however, follow-up is recommended to exclude sigmoid neoplasm which can have a similar appearance and presentation. Mild mesenteric lymphadenopathy in the region is stable since April. Trace free fluid with no organized or drainable fluid collection. 4. Chronic nephrolithiasis.   Electronically Signed   By: Lars Pinks M.D.   On: 10/01/2014 17:53     EKG Interpretation None      MDM   Final diagnoses:  Acute right flank pain  Back pain  Sigmoid thickening   Patient with right flank pain near right kidney region, discussed likely musculoskeletal, no hydronephrosis on bedside ultrasound. Initially patient did not disclose his medical history as aneurysm, review of medical record showed patient does have an aortic aneurysm. CT angina ordered to rule out significant enlargement or leak. Clinically still most likely musculoskeletal however we'll assess more detailed imaging.  Patient  unable to have IV contrast and refuses to have IV contrast as he had multiple issues in the past with side effects and causing gout flares. Patient's pain improved in ER. CT without contrast done showing sigmoid thickening, patient will need outpatient colonoscopy and assessment by gastroenterology. Thoracic aneurysm size improved since surgery. No signs of leak or patient unable to have contrast. Followup outpatient discussed.   Results and differential diagnosis were discussed with the patient/parent/guardian. Close follow up outpatient was discussed, comfortable with the plan.   Medications  HYDROcodone-acetaminophen (NORCO/VICODIN) 5-325 MG per tablet 2 tablet (2 tablets Oral Given 10/01/14 1615)  sodium chloride 0.9 % bolus 1,000 mL (1,000 mLs Intravenous New Bag/Given 10/01/14 1715)    Filed Vitals:   10/01/14 1437  BP: 118/77  Pulse: 65  Temp: 99.1 F (37.3  C)  TempSrc: Oral  Resp: 18  Height: 5\' 8"  (1.727 m)  Weight: 255 lb (115.667 kg)  SpO2: 98%    Final diagnoses:  Acute right flank pain  Back pain  Sigmoid thickening      Mariea Clonts, MD 10/01/14 1812

## 2014-10-02 IMAGING — CR DG KNEE 1-2V*L*
2 series · 2 of 2 positions shown · non-contrast
Comparison: None.

CLINICAL DATA: Burning sensation in the right thigh for more than 1
week.

EXAM:
LEFT KNEE - 1-2 VIEW

[t knee ap left]
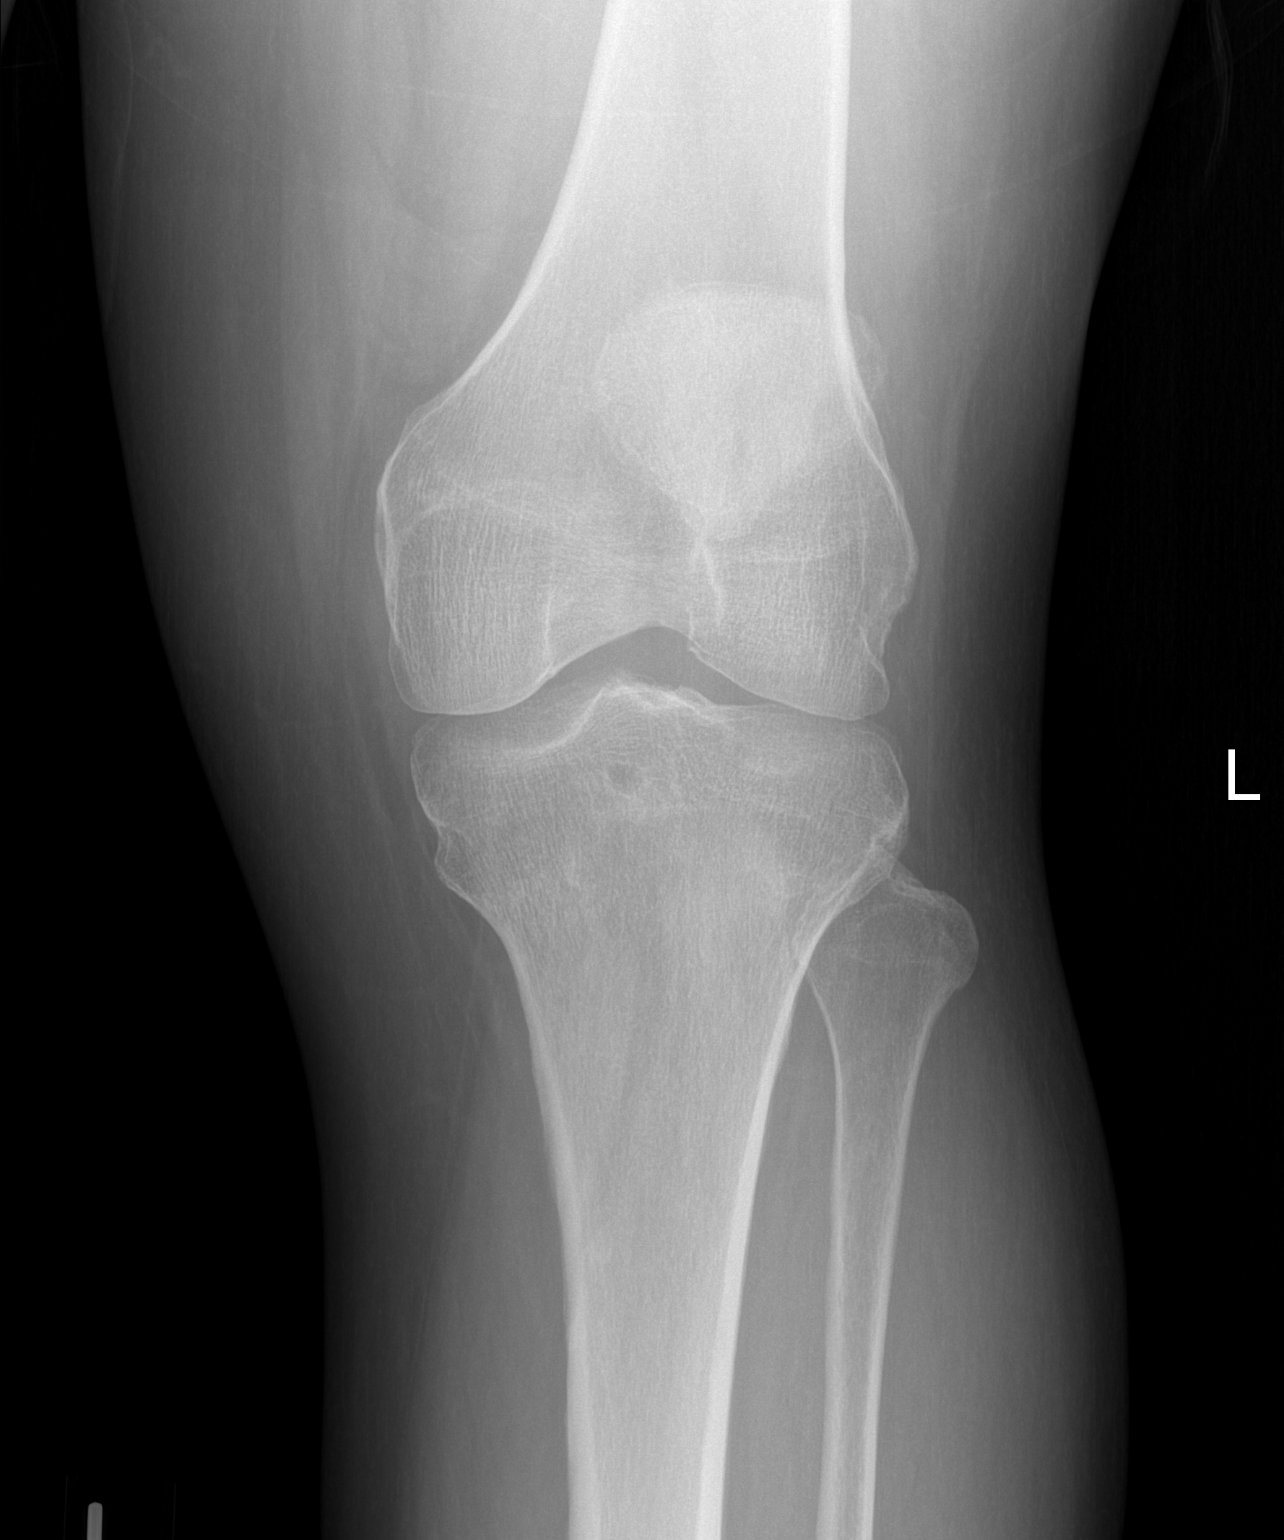

[t knee lat left]
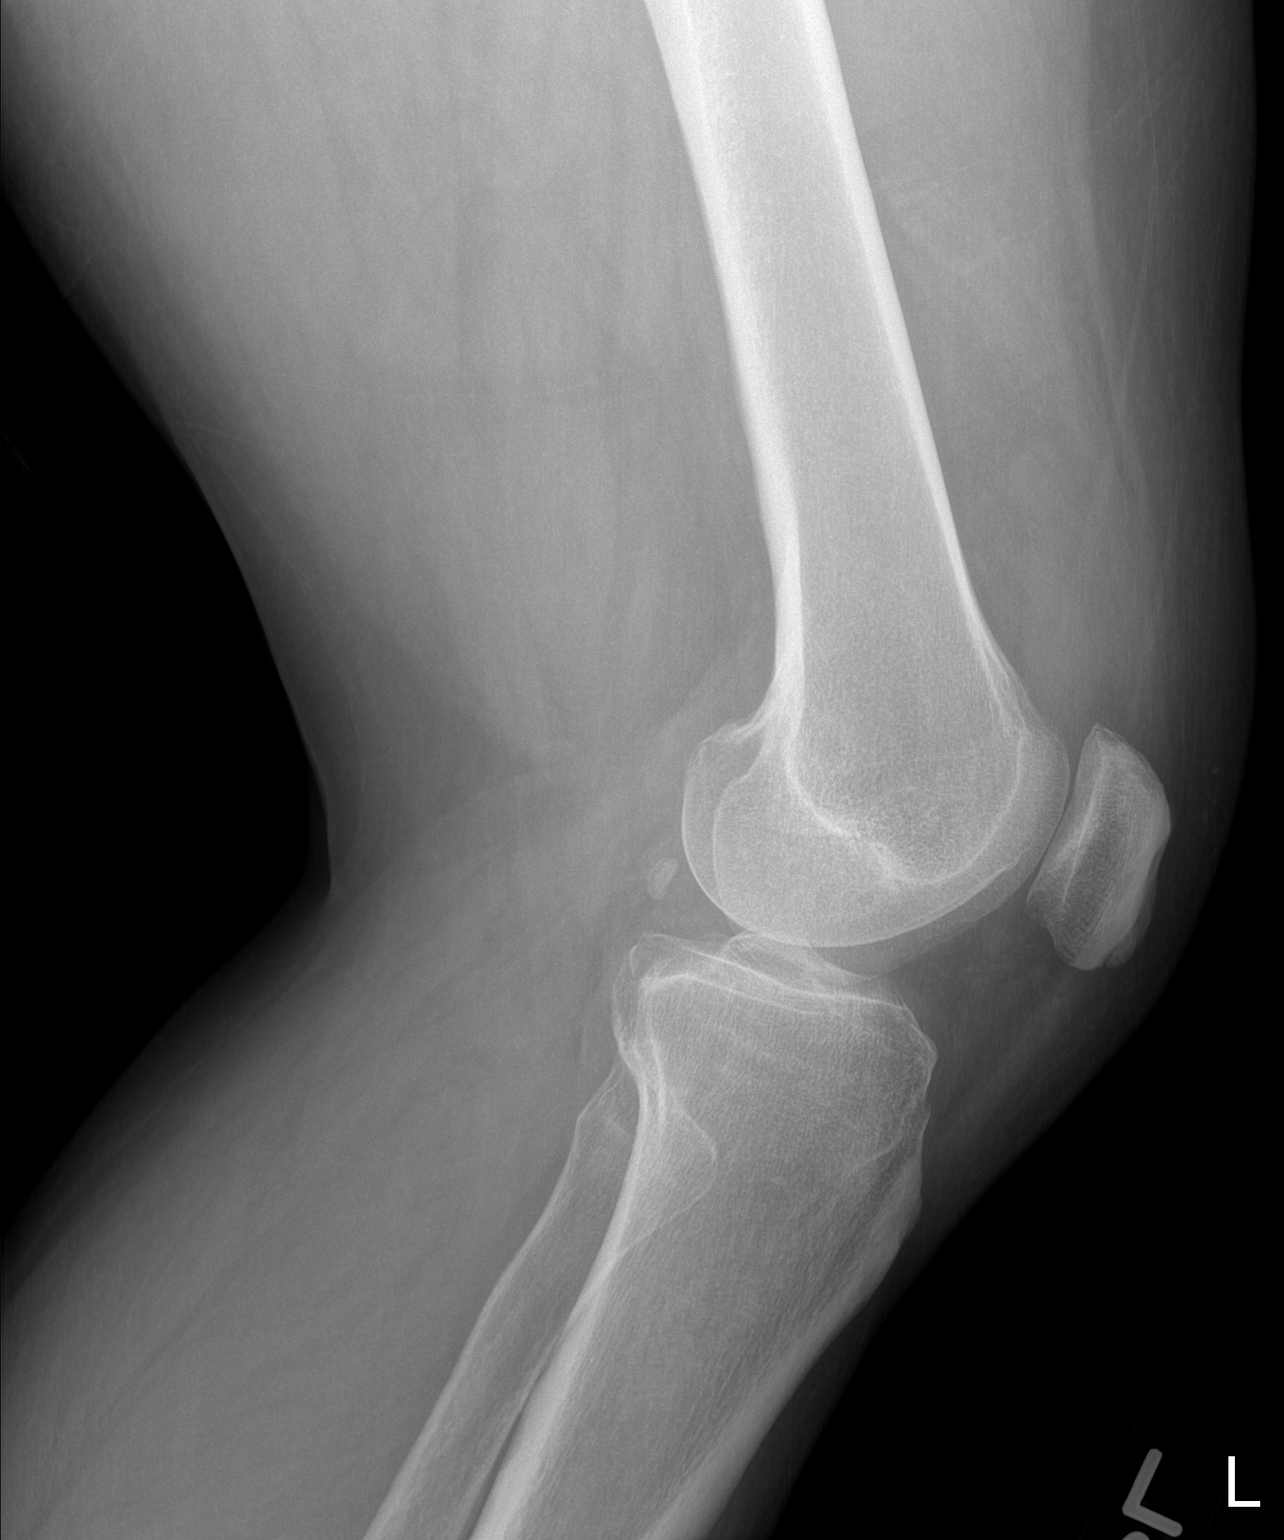

[2 of 2 positions shown; findings below may reference images not displayed]

FINDINGS: There is no evidence of fracture or dislocation. The joint spaces
are preserved. No significant degenerative change is seen; the
patellofemoral joint is grossly unremarkable in appearance. A
fabella is noted.

A small to moderate knee joint effusion is seen. The visualized soft
tissues are otherwise unremarkable in appearance.
IMPRESSION: 1. No evidence of fracture or dislocation.
2. Small to moderate knee joint effusion. Evaluation of the soft
tissues of the right thigh is limited on radiograph.

## 2014-10-05 ENCOUNTER — Encounter: Payer: Self-pay | Admitting: Cardiology

## 2014-10-05 ENCOUNTER — Ambulatory Visit (INDEPENDENT_AMBULATORY_CARE_PROVIDER_SITE_OTHER): Payer: Self-pay | Admitting: *Deleted

## 2014-10-05 ENCOUNTER — Ambulatory Visit (INDEPENDENT_AMBULATORY_CARE_PROVIDER_SITE_OTHER): Payer: Self-pay | Admitting: Cardiology

## 2014-10-05 VITALS — BP 138/78 | HR 80 | Ht 68.0 in | Wt 266.0 lb

## 2014-10-05 DIAGNOSIS — Z952 Presence of prosthetic heart valve: Secondary | ICD-10-CM

## 2014-10-05 DIAGNOSIS — I359 Nonrheumatic aortic valve disorder, unspecified: Secondary | ICD-10-CM

## 2014-10-05 DIAGNOSIS — G4733 Obstructive sleep apnea (adult) (pediatric): Secondary | ICD-10-CM

## 2014-10-05 DIAGNOSIS — Z5181 Encounter for therapeutic drug level monitoring: Secondary | ICD-10-CM

## 2014-10-05 DIAGNOSIS — I5022 Chronic systolic (congestive) heart failure: Secondary | ICD-10-CM

## 2014-10-05 DIAGNOSIS — Z954 Presence of other heart-valve replacement: Secondary | ICD-10-CM

## 2014-10-05 LAB — POCT INR: INR: 2.9

## 2014-10-05 MED ORDER — LISINOPRIL 2.5 MG PO TABS: 2.5000 mg | ORAL_TABLET | Freq: Every day | ORAL | Status: DC

## 2014-10-05 NOTE — Patient Instructions (Signed)
Your physician recommends that you schedule a follow-up appointment in: 3 weeks   Your physician has recommended you make the following change in your medication:     START Lisinopril 2.5 mg daily   Please get lab work in 2 weeks (BMET)   You have been referred to [facility] for sleep study. Expect a call from his office    Your INR today is 2.9   I have sent a message to Edrick Oh, RN your Coumadin Nurse.She will instruct you on your Coumadin dosing.  Thank you for choosing [facility] !

## 2014-10-05 NOTE — Progress Notes (Signed)
Clinical Summary Alec Kelly is a 48 y.o.male seen today for follow up of the following medical problems.  1. 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 Walks regularly, typically about 15min to 1 hr for approx 1 mile. No LE edema, no orthopnea, no PND   2. Chronic systolic HF  - echo 12/23/3788 LVEF 25-30%, moderate pericardial effusion  - NICM, cath 05/2014 with patent coronaries. Potentially related to severe AS,  - compliant with meds, limiting sodium intake.   - last visit changed his lopressor to Toprol XL 25mg  daily. Denies any new side effects on medication  3. Pericardial effusion  - occurred after Bental procedure  - 06/27/14 had subxiphoid pericardial window  - denies any SOB, lightheadedness or dizziness  4. OSA screen + snoring, +apnea. +daytime somnolence.  Past Medical History  Diagnosis Date  . Diverticulitis   . Diverticulitis of sigmoid colon 04/07/2013    With perforation  . Obesity, Class III, BMI 40-49.9 (morbid obesity) 04/07/2013  . 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 Aortic arch aneurysm 2015  . Aortic stenosis 2015    s/p Bentall with mechanical (St Jude AVR) 05/2014  . Personal history of kidney stones   . GERD (gastroesophageal reflux disease)   . Claustrophobia     does not like anything put over his face  . NICM (nonischemic cardiomyopathy)     Echo (06/12/14):  Moderate LVH, EF 30-35%, mechanical AVR ok (mean 11 mm Hg), mild LAE, mildly reduced RVSF, mild to moderate RAE, moderate pericardial effusion  . Carotid stenosis     Carotid US (05/31/14):  Bilateral ICA 1-39%  . Hx of cardiac cath     a. LHC (05/15/14):  Ostial circumflex 20%, EF 25-30%  . Pericardial effusion     hemorrhagic after Bentall >>> s/p subxiphoid pericardial window 06/2014      Allergies  Allergen Reactions  . Ivp Dye [Iodinated  Diagnostic Agents] Nausea And Vomiting    ORAL LIQUID   . Morphine And Related Other (See Comments)    Not in right state of mind   . Shellfish-Derived Products Other (See Comments)    exacerbates gout     Current Outpatient Prescriptions  Medication Sig Dispense Refill  . allopurinol (ZYLOPRIM) 100 MG tablet Take 1 tablet (100 mg total) by mouth daily as needed (for gout).  30 tablet  0  . aspirin EC 81 MG EC tablet Take 1 tablet (81 mg total) by mouth daily.      . colchicine 0.6 MG tablet Take 1 tablet (0.6 mg total) by mouth daily.  14 tablet  0  . HYDROcodone-acetaminophen (NORCO) 5-325 MG per tablet Take 1-2 tablets by mouth every 4 (four) hours as needed.  6 tablet  0  . HYDROcodone-acetaminophen (NORCO/VICODIN) 5-325 MG per tablet Take 1-2 tablets by mouth every 6 (six) hours as needed for moderate pain.      . metoprolol succinate (TOPROL-XL) 25 MG 24 hr tablet Take 25 mg by mouth daily.      Marland Kitchen oxyCODONE-acetaminophen (PERCOCET/ROXICET) 5-325 MG per tablet Take 1-2 tablets by mouth every 4 (four) hours as needed for severe pain.  6 tablet  0  . warfarin (COUMADIN) 5 MG tablet Take 5-7.5 mg by mouth daily. Patient takes 7.5mg  daily except on Mon, Wed, and Fri when  patient takes 5 mg       No current facility-administered medications for this visit.     Past Surgical History  Procedure Laterality Date  . Tonsillectomy    . Appendectomy      24-62 years old - Dr. Lennie Hummer  . Hernia repair  04/20/4331    umbilical  - At [facility], Dr. Lennie Hummer, Repair of umbilical hernia with mesh.  . Lithotripsy    . Bentall procedure N/A 06/02/2014    Procedure: BENTALL PROCEDURE: 38mm St Jude Valve Conduit ;  Surgeon: Gaye Pollack, MD;  Location: [place];  Service: Open Heart Surgery;  Laterality: N/A;  . Ascending aortic root replacement N/A 06/02/2014    Procedure: Aortic Arch Replacement with 39mm Hemasheild Graft;  Surgeon: Gaye Pollack, MD;  Location: [place];  Service: Open Heart Surgery;   Laterality: N/A;  . Intraoperative transesophageal echocardiogram N/A 06/02/2014    Procedure: INTRAOPERATIVE TRANSESOPHAGEAL ECHOCARDIOGRAM;  Surgeon: Gaye Pollack, MD;  Location: [facility] OR;  Service: Open Heart Surgery;  Laterality: N/A;  . Subxyphoid pericardial window N/A 06/27/2014    Procedure: SUBXYPHOID PERICARDIAL WINDOW;  Surgeon: Gaye Pollack, MD;  Location: [place];  Service: Thoracic;  Laterality: N/A;  . Intraoperative transesophageal echocardiogram N/A 06/27/2014    Procedure: INTRAOPERATIVE TRANSESOPHAGEAL ECHOCARDIOGRAM;  Surgeon: Gaye Pollack, MD;  Location: [facility] OR;  Service: Open Heart Surgery;  Laterality: N/A;     Allergies  Allergen Reactions  . Ivp Dye [Iodinated Diagnostic Agents] Nausea And Vomiting    ORAL LIQUID   . Morphine And Related Other (See Comments)    Not in right state of mind   . Shellfish-Derived Products Other (See Comments)    exacerbates gout      Family History  Problem Relation Age of Onset  . Heart failure Mother   . Diabetes Mother   . Hypertension Mother   . Hyperlipidemia Mother   . COPD Mother   . Cancer Father     lung & prostate  . Hyperlipidemia Father   . Heart disease Father   . Coronary artery disease Father   . Heart attack Neg Hx      Social History [name] reports that he has never smoked. He has never used smokeless tobacco. [name] reports that he does not drink alcohol.   Review of Systems CONSTITUTIONAL: No weight loss, fever, chills, weakness or fatigue.  HEENT: Eyes: No visual loss, blurred vision, double vision or yellow sclerae.No hearing loss, sneezing, congestion, runny nose or sore throat.  SKIN: No rash or itching.  CARDIOVASCULAR: per HPI RESPIRATORY: No shortness of breath, cough or sputum.  GASTROINTESTINAL: No anorexia, nausea, vomiting or diarrhea. No abdominal pain or blood.  GENITOURINARY: No burning on urination, no polyuria NEUROLOGICAL: No headache, dizziness, syncope, paralysis,  ataxia, numbness or tingling in the extremities. No change in bowel or bladder control.  MUSCULOSKELETAL: No muscle, back pain, joint pain or stiffness.  LYMPHATICS: No enlarged nodes. No history of splenectomy.  PSYCHIATRIC: No history of depression or anxiety.  ENDOCRINOLOGIC: No reports of sweating, cold or heat intolerance. No polyuria or polydipsia.  Marland Kitchen   Physical Examination p 80 bp 138/78 Wt 266 lbs BMI 40 Gen: resting comfortably, no acute distress HEENT: no scleral icterus, pupils equal round and reactive, no palptable cervical adenopathy,  CV: RRR, mechanical S2, no JVD, no carotid bruits Resp: Clear to auscultation bilaterally GI: abdomen is soft, non-tender, non-distended, normal bowel sounds, no hepatosplenomegaly MSK:  extremities are warm, no edema.  Skin: warm, no rash Neuro:  no focal deficits Psych: appropriate affect   Diagnostic Studies 06/2014 Echo  Study Conclusions  - Left ventricle: Systolic function was severely reduced. The estimated ejection fraction was in the range of 25% to 30%. - Pericardium, extracardiac: A moderate to large, free-flowing pericardial effusion was identified. There was no evidence of hemodynamic compromise.  05/2014 Cath  Procedural Findings:  Hemodynamics  RA 24/22 mean 20 mm Hg  RV 47/16 mm Hg  PA 41/31 mean 35 mm Hg  PCWP 27/25 mean 25 mm Hg  LV 164/24 mm Hg  AO 106/77 mean 95 mm Hg  AV gradient: Peak-58 mm Hg, mean 27 mm Hg  AV area- 1.5 cm squared with index 0.6.  Oxygen saturations:  PA 71%  AO 98%  Cardiac Output (Fick) 6.0 L/min  Cardiac Index (Fick) 2.57 L/min/meter squared.  Coronary angiography:  Coronary dominance: left  Left mainstem: Normal  Left anterior descending (LAD): Normal  Left circumflex (LCx): 20% ostial disease. Otherwise normal. Large dominant vessel.  Right coronary artery (RCA): small nondominant vessel. Normal.  Left ventriculography: Left ventricular systolic function is abnormal, LVEF is  estimated at 25-30%. The LV is enlarged with global hypokinesis. There is no significant mitral regurgitation  Aortic root angiography: The AV is heavily calcified and deformed with reduced mobility. 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  Final Conclusions:  1. Normal coronary anatomy.  2. Severe LV dysfunction.  3. Severe aortic stenosis.  4. Proximal aortic root aneurysm.  5. Mild pulmonary HTN with elevated LV filling pressures.  Recommendations: Referral to CT surgery for Bentall procedure.      Assessment and Plan  1. History of aortic stenosis and thoracic aneurysm  - s/p Bentall procedure, he as recovered from surgery very well  - denies any significant symptoms, doing well on coumadin  - continue current meds   2. Pericardial effusion  - postop effusion after Bentall procedure, s/p subxiphoid window  - denies any current symptoms, continue to follow   3. Chronic systolic HF/NICM  - LVEF 47-42% by echo 06/2014, recent cath with patent coronaries  - etiology may be related to severe AS, vs NICM (genetic, postinfectoius etc)  - Medical therapy has been somewhat limited initially by low bp's. Will start lisinopril 2.5mg  daily, check BMET in 2 weeks. - once optimzed on medical therapy will need repeat echo, possible consideration for ICD  4. OSA screen - several signs and symptoms of OSA, refer to sleep medicine  F/u 3 weeks for further medication titration   Alec Kelly, M.D.

## 2014-10-05 NOTE — Progress Notes (Signed)
INR 2.9  Will message Edrick Oh RN Coumadin nurse

## 2014-10-07 ENCOUNTER — Encounter (HOSPITAL_COMMUNITY): Payer: Self-pay | Admitting: Emergency Medicine

## 2014-10-07 ENCOUNTER — Emergency Department (HOSPITAL_COMMUNITY): Payer: Self-pay

## 2014-10-07 ENCOUNTER — Emergency Department (HOSPITAL_COMMUNITY)
Admission: EM | Admit: 2014-10-07 | Discharge: 2014-10-07 | Disposition: A | Discharge status: Home / Self Care | Payer: Self-pay | Attending: Emergency Medicine | Admitting: Emergency Medicine

## 2014-10-07 DIAGNOSIS — Z7982 Long term (current) use of aspirin: Secondary | ICD-10-CM | POA: Insufficient documentation

## 2014-10-07 DIAGNOSIS — Z8679 Personal history of other diseases of the circulatory system: Secondary | ICD-10-CM | POA: Insufficient documentation

## 2014-10-07 DIAGNOSIS — Z8719 Personal history of other diseases of the digestive system: Secondary | ICD-10-CM | POA: Insufficient documentation

## 2014-10-07 DIAGNOSIS — Z8659 Personal history of other mental and behavioral disorders: Secondary | ICD-10-CM | POA: Insufficient documentation

## 2014-10-07 DIAGNOSIS — S8992XA Unspecified injury of left lower leg, initial encounter: Secondary | ICD-10-CM | POA: Insufficient documentation

## 2014-10-07 DIAGNOSIS — Z9889 Other specified postprocedural states: Secondary | ICD-10-CM | POA: Insufficient documentation

## 2014-10-07 DIAGNOSIS — W228XXA Striking against or struck by other objects, initial encounter: Secondary | ICD-10-CM | POA: Insufficient documentation

## 2014-10-07 DIAGNOSIS — M25462 Effusion, left knee: Secondary | ICD-10-CM

## 2014-10-07 DIAGNOSIS — Y9301 Activity, walking, marching and hiking: Secondary | ICD-10-CM | POA: Insufficient documentation

## 2014-10-07 DIAGNOSIS — Z79899 Other long term (current) drug therapy: Secondary | ICD-10-CM | POA: Insufficient documentation

## 2014-10-07 DIAGNOSIS — R52 Pain, unspecified: Secondary | ICD-10-CM

## 2014-10-07 DIAGNOSIS — Z7901 Long term (current) use of anticoagulants: Secondary | ICD-10-CM | POA: Insufficient documentation

## 2014-10-07 DIAGNOSIS — Z87442 Personal history of urinary calculi: Secondary | ICD-10-CM | POA: Insufficient documentation

## 2014-10-07 DIAGNOSIS — Y9289 Other specified places as the place of occurrence of the external cause: Secondary | ICD-10-CM | POA: Insufficient documentation

## 2014-10-07 MED ORDER — PREDNISONE 10 MG PO TABS
60.0000 mg | ORAL_TABLET | Freq: Once | ORAL | Status: AC
  Administered 2014-10-07: 60 mg via ORAL
  Filled 2014-10-07 (×2): qty 1

## 2014-10-07 MED ORDER — OXYCODONE-ACETAMINOPHEN 5-325 MG PO TABS
1.0000 | ORAL_TABLET | Freq: Once | ORAL | Status: AC
  Administered 2014-10-07: 1 via ORAL
  Filled 2014-10-07: qty 1

## 2014-10-07 MED ORDER — OXYCODONE-ACETAMINOPHEN 5-325 MG PO TABS: 1.0000 | ORAL_TABLET | ORAL | Status: DC | PRN

## 2014-10-07 MED ORDER — PREDNISONE 10 MG PO TABS: ORAL_TABLET | ORAL | Status: DC

## 2014-10-07 NOTE — ED Provider Notes (Signed)
CSN: [id number]     Arrival date & time 10/07/14  1204 History  This chart was scribed for non-physician practitioner, Debroah Baller, FNP,working with Fredia Sorrow, MD, by Marlowe Kays, ED Scribe. This patient was seen in room APFT20/APFT20 and the patient's care was started at 2:46 PM.  Chief Complaint  Patient presents with  . Knee Pain   Patient is a 48 y.o. male presenting with knee pain. The history is provided by the patient. No language interpreter was used.  Knee Pain  HPI Comments:  Alec Kelly is a 48 y.o. obese male with PMHx of gout, diverticulitis, aortic arch aneurysm, cardiac stenosis and pericardial effusion who presents to the Emergency Department complaining of severe left knee pain that occurred yesterday. He states he walking walking in his driveway and stepped on a rock, causing his knee to pop and "shift". He reports some swelling of the knee. Pt has taken Vicodin with some relief of the pain. He denies numbness, tingling or weakness of the left knee. He reports surgical h/o hear surgery earlier this year. Pt is a Administrator. He takes Coumadin and ASA daily. He states he has taken Prednisone with the last time being three months ago.  Past Medical History  Diagnosis Date  . Diverticulitis   . Diverticulitis of sigmoid colon 04/07/2013    With perforation  . Obesity, Class III, BMI 40-49.9 (morbid obesity) 04/07/2013  . 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 Aortic arch aneurysm 2015  . Aortic stenosis 2015    s/p Bentall with mechanical (St Jude AVR) 05/2014  . Personal history of kidney stones   . GERD (gastroesophageal reflux disease)   . Claustrophobia     does not like anything put over his face  . NICM (nonischemic cardiomyopathy)     Echo (06/12/14):  Moderate LVH, EF 30-35%, mechanical AVR ok (mean 11 mm Hg), mild LAE, mildly reduced RVSF, mild to moderate RAE, moderate pericardial effusion  . Carotid stenosis     Carotid US (05/31/14):  Bilateral  ICA 1-39%  . Hx of cardiac cath     a. LHC (05/15/14):  Ostial circumflex 20%, EF 25-30%  . Pericardial effusion     hemorrhagic after Bentall >>> s/p subxiphoid pericardial window 06/2014    Past Surgical History  Procedure Laterality Date  . Tonsillectomy    . Appendectomy      13-33 years old - Dr. Lennie Hummer  . Hernia repair  02/20/7563    umbilical  - At [facility], Dr. Lennie Hummer, Repair of umbilical hernia with mesh.  . Lithotripsy    . Bentall procedure N/A 06/02/2014    Procedure: BENTALL PROCEDURE: 69mm St Jude Valve Conduit ;  Surgeon: Gaye Pollack, MD;  Location: [place];  Service: Open Heart Surgery;  Laterality: N/A;  . Ascending aortic root replacement N/A 06/02/2014    Procedure: Aortic Arch Replacement with 67mm Hemasheild Graft;  Surgeon: Gaye Pollack, MD;  Location: [place];  Service: Open Heart Surgery;  Laterality: N/A;  . Intraoperative transesophageal echocardiogram N/A 06/02/2014    Procedure: INTRAOPERATIVE TRANSESOPHAGEAL ECHOCARDIOGRAM;  Surgeon: Gaye Pollack, MD;  Location: [facility] OR;  Service: Open Heart Surgery;  Laterality: N/A;  . Subxyphoid pericardial window N/A 06/27/2014    Procedure: SUBXYPHOID PERICARDIAL WINDOW;  Surgeon: Gaye Pollack, MD;  Location: [place];  Service: Thoracic;  Laterality: N/A;  . Intraoperative transesophageal echocardiogram N/A 06/27/2014    Procedure: INTRAOPERATIVE TRANSESOPHAGEAL ECHOCARDIOGRAM;  Surgeon: Gaye Pollack, MD;  Location: [place];  Service: Open Heart Surgery;  Laterality: N/A;  . Cardiac surgery     Family History  Problem Relation Age of Onset  . Heart failure Mother   . Diabetes Mother   . Hypertension Mother   . Hyperlipidemia Mother   . COPD Mother   . Cancer Father     lung & prostate  . Hyperlipidemia Father   . Heart disease Father   . Coronary artery disease Father   . Heart attack Neg Hx    History  Substance Use Topics  . Smoking status: Never Smoker   . Smokeless tobacco: Never Used  . Alcohol Use: No      Comment: social    Review of Systems  Musculoskeletal: Positive for arthralgias and joint swelling.       Left knee pain   Neurological: Negative for weakness and numbness.  All other systems reviewed and are negative.   Allergies  Ivp dye; Morphine and related; and Shellfish-derived products  Home Medications   Prior to Admission medications   Medication Sig Start Date End Date Taking? Authorizing Provider  allopurinol (ZYLOPRIM) 100 MG tablet Take 1 tablet (100 mg total) by mouth daily as needed (for gout). 06/13/14   [name], PA-C  aspirin EC 81 MG EC tablet Take 1 tablet (81 mg total) by mouth daily. 06/09/14   [name], PA-C  colchicine 0.6 MG tablet Take 1 tablet (0.6 mg total) by mouth daily. 09/17/14   Debby Freiberg, MD  HYDROcodone-acetaminophen (NORCO) 5-325 MG per tablet Take 1-2 tablets by mouth every 4 (four) hours as needed. 10/01/14   Mariea Clonts, MD  lisinopril (PRINIVIL,ZESTRIL) 2.5 MG tablet Take 1 tablet (2.5 mg total) by mouth daily. 10/05/14   [name], MD  metoprolol succinate (TOPROL-XL) 25 MG 24 hr tablet Take 25 mg by mouth daily.    Historical Provider, MD  oxyCODONE-acetaminophen (PERCOCET/ROXICET) 5-325 MG per tablet Take 1-2 tablets by mouth every 4 (four) hours as needed for severe pain. 09/17/14   Debby Freiberg, MD  warfarin (COUMADIN) 5 MG tablet Take 5-7.5 mg by mouth daily. Patient takes 7.5mg  daily except on Mon, Wed, and Fri when patient takes 5 mg    Historical Provider, MD   Triage Vitals: BP 119/71  Pulse 59  Temp(Src) 98.5 F (36.9 C) (Oral)  Resp 16  Ht 5\' 8"  (1.727 m)  Wt 266 lb (120.657 kg)  BMI 40.45 kg/m2  SpO2 95% Physical Exam  Nursing note and vitals reviewed. Constitutional: He is oriented to person, place, and time. He appears well-developed and well-nourished. No distress.  HENT:  Head: Normocephalic.  Eyes: EOM are normal.  Neck: Neck supple.  Cardiovascular: Normal rate.   DP pulses  strong and equal bilaterally.  Pulmonary/Chest: Effort normal.  Musculoskeletal: Normal range of motion. He exhibits edema and tenderness.  Left knee with edema, tenderness and increased warmth. Tenderness with any movement of patella. Increased pain just below the patella on medial aspect. Pedal pulses equal, adequate circulation, good touch sensation.   Neurological: He is alert and oriented to person, place, and time. No cranial nerve deficit.  Skin: Skin is warm and dry.  Psychiatric: He has a normal mood and affect. His behavior is normal.    ED Course  Procedures (including critical care time) DIAGNOSTIC STUDIES: Oxygen Saturation is 95% on RA, adequate by my interpretation.   COORDINATION OF CARE: 2:54 PM- Will prescribe  pain medication and Prednisone. Will order knee immobilizer and refer to orthopedics. Will give pain medication and first dose of steroid prior to discharge. Pt verbalizes understanding and agrees to plan. Imaging Review Dg Knee Complete 4 Views Left  10/07/2014   CLINICAL DATA:  Patient slipped on a rock in his driveway yesterday, hyperextending the left knee. Complaining of pain and swelling mostly across the anterior knee.  EXAM: LEFT KNEE - COMPLETE 4+ VIEW  COMPARISON:  08/27/2014.  FINDINGS: No fracture. Knee joint is normally spaced and aligned. There are no significant arthropathic changes.  There is a large joint effusion.  Soft tissues are unremarkable.  IMPRESSION: No fracture or dislocation. Large joint effusion. Given the provided history, this is suspicious for internal derangement. Consider followup non emergent left knee MRI.   Electronically Signed   By: Lajean Manes M.D.   On: 10/07/2014 14:07     MDM  48 y.o. male with left knee pain s/p injury. Placed in knee immobilizer, crutches, ice, elevation and follow up with ortho. I have reviewed this patient's vital signs, nurses notes, appropriate labs and imaging.  I have discussed findings with the  patient and plan of care he voices understanding and agrees with plan.  Stable for discharge without neurovascular deficits.    Medication List    TAKE these medications       predniSONE 10 MG tablet  Commonly known as:  DELTASONE  Starting tomorrow 10/08/14 take 2 tablets PO BID      ASK your doctor about these medications       allopurinol 100 MG tablet  Commonly known as:  ZYLOPRIM  Take 1 tablet (100 mg total) by mouth daily as needed (for gout).     aspirin 81 MG EC tablet  Take 1 tablet (81 mg total) by mouth daily.     colchicine 0.6 MG tablet  Take 1 tablet (0.6 mg total) by mouth daily.     HYDROcodone-acetaminophen 5-325 MG per tablet  Commonly known as:  NORCO  Take 1-2 tablets by mouth every 4 (four) hours as needed.     lisinopril 2.5 MG tablet  Commonly known as:  PRINIVIL,ZESTRIL  Take 1 tablet (2.5 mg total) by mouth daily.     metoprolol succinate 25 MG 24 hr tablet  Commonly known as:  TOPROL-XL  Take 25 mg by mouth daily.     oxyCODONE-acetaminophen 5-325 MG per tablet  Commonly known as:  PERCOCET/ROXICET  Take 1-2 tablets by mouth every 4 (four) hours as needed for severe pain.  Ask about: Which instructions should I use?     oxyCODONE-acetaminophen 5-325 MG per tablet  Commonly known as:  ROXICET  Take 1 tablet by mouth every 4 (four) hours as needed for severe pain.  Ask about: Which instructions should I use?     warfarin 5 MG tablet  Commonly known as:  COUMADIN  Take 5-7.5 mg by mouth daily. Patient takes 7.5mg  daily except on Mon, Wed, and Fri when patient takes 5 mg        I personally performed the services described in this documentation, which was scribed in my presence. The recorded information has been reviewed and is accurate.    [facility] Bunnie Pion, NP 10/10/14 0157

## 2014-10-07 NOTE — ED Notes (Signed)
Pt states he stepped on a rock yesterday afternoon and states his bones shifted, c/o left knee pain, pt using crutches from home

## 2014-10-07 NOTE — ED Notes (Signed)
Patient with no complaints at this time. Respirations even and unlabored. Skin warm/dry. Discharge instructions reviewed with patient at this time. Patient given opportunity to voice concerns/ask questions. Patient discharged at this time and left Emergency Department with steady gait.   

## 2014-10-10 NOTE — ED Provider Notes (Signed)
Medical screening examination/treatment/procedure(s) were performed by non-physician practitioner and as supervising physician I was immediately available for consultation/collaboration.   EKG Interpretation None        Fredia Sorrow, MD 10/10/14 [phone]

## 2014-10-27 ENCOUNTER — Ambulatory Visit (INDEPENDENT_AMBULATORY_CARE_PROVIDER_SITE_OTHER): Payer: Self-pay | Admitting: Cardiology

## 2014-10-27 ENCOUNTER — Ambulatory Visit (INDEPENDENT_AMBULATORY_CARE_PROVIDER_SITE_OTHER): Payer: MEDICAID

## 2014-10-27 ENCOUNTER — Encounter: Payer: Self-pay | Admitting: Cardiology

## 2014-10-27 VITALS — BP 122/76 | HR 77 | Ht 68.0 in | Wt 265.0 lb

## 2014-10-27 DIAGNOSIS — I5022 Chronic systolic (congestive) heart failure: Secondary | ICD-10-CM

## 2014-10-27 DIAGNOSIS — Z23 Encounter for immunization: Secondary | ICD-10-CM

## 2014-10-27 MED ORDER — METOPROLOL SUCCINATE ER 50 MG PO TB24: 50.0000 mg | ORAL_TABLET | Freq: Every day | ORAL | Status: DC

## 2014-10-27 NOTE — Progress Notes (Signed)
Clinical Summary Alec Kelly is a 48 y.o.male seen today for follow up of the following medical problems. This is a focused visit on his history of chronic systolic heart failure, for more detailed medical history refer to prior clinic notes.   1. Chronic systolic HF  - echo 08/19/6212 LVEF 25-30%, moderate pericardial effusion  - NICM, cath 05/2014 with patent coronaries. Potentially related to severe AS  - he is compliant with his med. Medical therapy initially limited by low blood pressures. He tolerated the addition of lisinopril 2.5mg  last visit - denies any significnat SOB, DOE, LE edema   Past Medical History  Diagnosis Date  . Diverticulitis   . Diverticulitis of sigmoid colon 04/07/2013    With perforation  . Obesity, Class III, BMI 40-49.9 (morbid obesity) 04/07/2013  . 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 Aortic arch aneurysm 2015  . Aortic stenosis 2015    s/p Bentall with mechanical (St Jude AVR) 05/2014  . Personal history of kidney stones   . GERD (gastroesophageal reflux disease)   . Claustrophobia     does not like anything put over his face  . NICM (nonischemic cardiomyopathy)     Echo (06/12/14):  Moderate LVH, EF 30-35%, mechanical AVR ok (mean 11 mm Hg), mild LAE, mildly reduced RVSF, mild to moderate RAE, moderate pericardial effusion  . Carotid stenosis     Carotid US (05/31/14):  Bilateral ICA 1-39%  . Hx of cardiac cath     a. LHC (05/15/14):  Ostial circumflex 20%, EF 25-30%  . Pericardial effusion     hemorrhagic after Bentall >>> s/p subxiphoid pericardial window 06/2014      Allergies  Allergen Reactions  . Ivp Dye [Iodinated Diagnostic Agents] Nausea And Vomiting    ORAL LIQUID   . Morphine And Related Other (See Comments)    Not in right state of mind   . Shellfish-Derived Products Other (See Comments)    exacerbates gout     Current Outpatient Prescriptions  Medication Sig Dispense Refill  . allopurinol (ZYLOPRIM) 100 MG tablet  Take 1 tablet (100 mg total) by mouth daily as needed (for gout). 30 tablet 0  . aspirin EC 81 MG EC tablet Take 1 tablet (81 mg total) by mouth daily.    . colchicine 0.6 MG tablet Take 1 tablet (0.6 mg total) by mouth daily. 14 tablet 0  . HYDROcodone-acetaminophen (NORCO) 5-325 MG per tablet Take 1-2 tablets by mouth every 4 (four) hours as needed. 6 tablet 0  . lisinopril (PRINIVIL,ZESTRIL) 2.5 MG tablet Take 1 tablet (2.5 mg total) by mouth daily. 90 tablet 3  . metoprolol succinate (TOPROL-XL) 25 MG 24 hr tablet Take 25 mg by mouth daily.    Marland Kitchen oxyCODONE-acetaminophen (PERCOCET/ROXICET) 5-325 MG per tablet Take 1-2 tablets by mouth every 4 (four) hours as needed for severe pain. 6 tablet 0  . oxyCODONE-acetaminophen (ROXICET) 5-325 MG per tablet Take 1 tablet by mouth every 4 (four) hours as needed for severe pain. 20 tablet 0  . predniSONE (DELTASONE) 10 MG tablet Starting tomorrow 10/08/14 take 2 tablets PO BID 12 tablet 0  . warfarin (COUMADIN) 5 MG tablet Take 5-7.5 mg by mouth daily. Patient takes 7.5mg  daily except on Mon, Wed, and Fri when patient takes 5 mg     No current facility-administered medications for this visit.     Past Surgical History  Procedure Laterality Date  . Tonsillectomy    . Appendectomy  2-110 years old - Dr. Lennie Hummer  . Hernia repair  4/0/9735    umbilical  - At [facility], Dr. Lennie Hummer, Repair of umbilical hernia with mesh.  . Lithotripsy    . Bentall procedure N/A 06/02/2014    Procedure: BENTALL PROCEDURE: 69mm St Jude Valve Conduit ;  Surgeon: Gaye Pollack, MD;  Location: [place];  Service: Open Heart Surgery;  Laterality: N/A;  . Ascending aortic root replacement N/A 06/02/2014    Procedure: Aortic Arch Replacement with 22mm Hemasheild Graft;  Surgeon: Gaye Pollack, MD;  Location: [place];  Service: Open Heart Surgery;  Laterality: N/A;  . Intraoperative transesophageal echocardiogram N/A 06/02/2014    Procedure: INTRAOPERATIVE TRANSESOPHAGEAL  ECHOCARDIOGRAM;  Surgeon: Gaye Pollack, MD;  Location[facility];  Service: Open Heart Surgery;  Laterality: N/A;  . Subxyphoid pericardial window N/A 06/27/2014    Procedure: SUBXYPHOID PERICARDIAL WINDOW;  Surgeon: Gaye Pollack, MD;  Location: [place];  Service: Thoracic;  Laterality: N/A;  . Intraoperative transesophageal echocardiogram N/A 06/27/2014    Procedure: INTRAOPERATIVE TRANSESOPHAGEAL ECHOCARDIOGRAM;  Surgeon: Gaye Pollack, MD;  Location: [facility] OR;  Service: Open Heart Surgery;  Laterality: N/A;  . Cardiac surgery       Allergies  Allergen Reactions  . Ivp Dye [Iodinated Diagnostic Agents] Nausea And Vomiting    ORAL LIQUID   . Morphine And Related Other (See Comments)    Not in right state of mind   . Shellfish-Derived Products Other (See Comments)    exacerbates gout      Family History  Problem Relation Age of Onset  . Heart failure Mother   . Diabetes Mother   . Hypertension Mother   . Hyperlipidemia Mother   . COPD Mother   . Cancer Father     lung & prostate  . Hyperlipidemia Father   . Heart disease Father   . Coronary artery disease Father   . Heart attack Neg Hx      Social History Alec Kelly reports that he has never smoked. He has never used smokeless tobacco. Alec Kelly reports that he does not drink alcohol.   Review of Systems CONSTITUTIONAL: No weight loss, fever, chills, weakness or fatigue.  HEENT: Eyes: No visual loss, blurred vision, double vision or yellow sclerae.No hearing loss, sneezing, congestion, runny nose or sore throat.  SKIN: No rash or itching.  CARDIOVASCULAR: per HPI RESPIRATORY: No shortness of breath, cough or sputum.  GASTROINTESTINAL: No anorexia, nausea, vomiting or diarrhea. No abdominal pain or blood.  GENITOURINARY: No burning on urination, no polyuria NEUROLOGICAL: No headache, dizziness, syncope, paralysis, ataxia, numbness or tingling in the extremities. No change in bowel or bladder control.  MUSCULOSKELETAL:  No muscle, back pain, joint pain or stiffness.  LYMPHATICS: No enlarged nodes. No history of splenectomy.  PSYCHIATRIC: No history of depression or anxiety.  ENDOCRINOLOGIC: No reports of sweating, cold or heat intolerance. No polyuria or polydipsia.  Marland Kitchen   Physical Examination p 77 bp 122/76 Wt 265 lbs BMI 40 Gen: resting comfortably, no acute distress HEENT: no scleral icterus, pupils equal round and reactive, no palptable cervical adenopathy,  CV: RRR, no m/r/g, mechanical S2 Resp: Clear to auscultation bilaterally GI: abdomen is soft, non-tender, non-distended, normal bowel sounds, no hepatosplenomegaly MSK: extremities are warm, no edema.  Skin: warm, no rash Neuro:  no focal deficits Psych: appropriate affect   Diagnostic Studies 06/2014 Echo  Study Conclusions  - Left ventricle: Systolic function was severely reduced. The estimated  ejection fraction was in the range of 25% to 30%. - Pericardium, extracardiac: A moderate to large, free-flowing pericardial effusion was identified. There was no evidence of hemodynamic compromise.  05/2014 Cath  Procedural Findings:  Hemodynamics  RA 24/22 mean 20 mm Hg  RV 47/16 mm Hg  PA 41/31 mean 35 mm Hg  PCWP 27/25 mean 25 mm Hg  LV 164/24 mm Hg  AO 106/77 mean 95 mm Hg  AV gradient: Peak-58 mm Hg, mean 27 mm Hg  AV area- 1.5 cm squared with index 0.6.  Oxygen saturations:  PA 71%  AO 98%  Cardiac Output (Fick) 6.0 L/min  Cardiac Index (Fick) 2.57 L/min/meter squared.  Coronary angiography:  Coronary dominance: left  Left mainstem: Normal  Left anterior descending (LAD): Normal  Left circumflex (LCx): 20% ostial disease. Otherwise normal. Large dominant vessel.  Right coronary artery (RCA): small nondominant vessel. Normal.  Left ventriculography: Left ventricular systolic function is abnormal, LVEF is estimated at 25-30%. The LV is enlarged with global hypokinesis. There is no significant mitral  regurgitation  Aortic root angiography: The AV is heavily calcified and deformed with reduced mobility. 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  Final Conclusions:  1. Normal coronary anatomy.  2. Severe LV dysfunction.  3. Severe aortic stenosis.  4. Proximal aortic root aneurysm.  5. Mild pulmonary HTN with elevated LV filling pressures.  Recommendations: Referral to CT surgery for Bentall procedure.      Assessment and Plan   1. Chronic systolic HF/NICM  - LVEF 09-98% by echo 06/2014, recent cath with patent coronaries  - etiology may be related to severe AS, vs NICM (genetic, postinfectoius etc)  - Medical therapy has been somewhat limited initially by low bp's. Will increase Toprol XL to 50mg  daily - once optimzed on medical therapy will need repeat echo, possible consideration for ICD  2. OSA - sleep study scheduled    F/u 3-4 weeks for continued medication titration, prioritize his beta blocker in titration  [name], M.D.

## 2014-10-27 NOTE — Patient Instructions (Signed)
Your physician recommends that you schedule a follow-up appointment in: 3-4 weeks     Your physician has recommended you make the following change in your medication:      INCREASE Toprol XL to 50 mg daily      Thank you for choosing [facility] !

## 2014-11-23 ENCOUNTER — Encounter (HOSPITAL_COMMUNITY): Payer: Self-pay | Admitting: Cardiology

## 2014-12-01 ENCOUNTER — Encounter: Payer: Self-pay | Admitting: Cardiology

## 2014-12-01 ENCOUNTER — Ambulatory Visit (INDEPENDENT_AMBULATORY_CARE_PROVIDER_SITE_OTHER): Payer: Self-pay | Admitting: Cardiology

## 2014-12-01 ENCOUNTER — Ambulatory Visit (INDEPENDENT_AMBULATORY_CARE_PROVIDER_SITE_OTHER): Payer: Self-pay | Admitting: *Deleted

## 2014-12-01 VITALS — BP 114/76 | HR 81 | Ht 68.0 in | Wt 282.0 lb

## 2014-12-01 DIAGNOSIS — Z952 Presence of prosthetic heart valve: Secondary | ICD-10-CM

## 2014-12-01 DIAGNOSIS — Z5181 Encounter for therapeutic drug level monitoring: Secondary | ICD-10-CM

## 2014-12-01 DIAGNOSIS — Z954 Presence of other heart-valve replacement: Secondary | ICD-10-CM

## 2014-12-01 DIAGNOSIS — I5022 Chronic systolic (congestive) heart failure: Secondary | ICD-10-CM

## 2014-12-01 DIAGNOSIS — I359 Nonrheumatic aortic valve disorder, unspecified: Secondary | ICD-10-CM

## 2014-12-01 LAB — POCT INR: INR: 3.7

## 2014-12-01 MED ORDER — LISINOPRIL 5 MG PO TABS: 5.0000 mg | ORAL_TABLET | Freq: Every day | ORAL | Status: AC

## 2014-12-01 MED ORDER — LISINOPRIL 2.5 MG PO TABS: 2.5000 mg | ORAL_TABLET | Freq: Every day | ORAL | Status: DC

## 2014-12-01 MED ORDER — METOPROLOL SUCCINATE ER 50 MG PO TB24: 50.0000 mg | ORAL_TABLET | Freq: Every day | ORAL | Status: DC

## 2014-12-01 NOTE — Progress Notes (Signed)
Clinical Summary Mr. Lauricella is a 48 y.o.male seen today for follow up of the following medical problems. This is a focused visit on his history of chronic systolic heart failure.  1. Chronic systolic HF  - echo 04/19/2129 LVEF 25-30%, moderate pericardial effusion  - NICM, cath 05/2014 with patent coronaries.   - he is compliant with his meds. Medical therapy initially limited by low blood pressures. Last visit we discussed increasing hit Tolrol Xl to 50mg  daily, however he instead increased his lisinopril to 5mg  daily instead - denies any significnat SOB, DOE, LE edema  Past Medical History  Diagnosis Date  . Diverticulitis   . Diverticulitis of sigmoid colon 04/07/2013    With perforation  . Obesity, Class III, BMI 40-49.9 (morbid obesity) 04/07/2013  . 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 Aortic arch aneurysm 2015  . Aortic stenosis 2015    s/p Bentall with mechanical (St Jude AVR) 05/2014  . Personal history of kidney stones   . GERD (gastroesophageal reflux disease)   . Claustrophobia     does not like anything put over his face  . NICM (nonischemic cardiomyopathy)     Echo (06/12/14):  Moderate LVH, EF 30-35%, mechanical AVR ok (mean 11 mm Hg), mild LAE, mildly reduced RVSF, mild to moderate RAE, moderate pericardial effusion  . Carotid stenosis     Carotid US (05/31/14):  Bilateral ICA 1-39%  . Hx of cardiac cath     a. LHC (05/15/14):  Ostial circumflex 20%, EF 25-30%  . Pericardial effusion     hemorrhagic after Bentall >>> s/p subxiphoid pericardial window 06/2014      Allergies  Allergen Reactions  . Ivp Dye [Iodinated Diagnostic Agents] Nausea And Vomiting    ORAL LIQUID   . Morphine And Related Other (See Comments)    Not in right state of mind   . Shellfish-Derived Products Other (See Comments)    exacerbates gout     Current Outpatient Prescriptions  Medication Sig Dispense Refill  . allopurinol (ZYLOPRIM) 100 MG tablet Take 1 tablet (100 mg total)  by mouth daily as needed (for gout). 30 tablet 0  . aspirin EC 81 MG EC tablet Take 1 tablet (81 mg total) by mouth daily.    . colchicine 0.6 MG tablet Take 1 tablet (0.6 mg total) by mouth daily. 14 tablet 0  . lisinopril (PRINIVIL,ZESTRIL) 2.5 MG tablet Take 1 tablet (2.5 mg total) by mouth daily. 90 tablet 3  . metoprolol succinate (TOPROL-XL) 50 MG 24 hr tablet Take 1 tablet (50 mg total) by mouth daily. Take with or immediately following a meal. 90 tablet 3  . warfarin (COUMADIN) 5 MG tablet Take 5-7.5 mg by mouth daily. Patient takes 7.5mg  daily except on Mon, Wed, and Fri when patient takes 5 mg     No current facility-administered medications for this visit.     Past Surgical History  Procedure Laterality Date  . Tonsillectomy    . Appendectomy      57-48 years old - Dr. Lennie Hummer  . Hernia repair  07/21/5783    umbilical  - At [facility], Dr. Lennie Hummer, Repair of umbilical hernia with mesh.  . Lithotripsy    . Bentall procedure N/A 06/02/2014    Procedure: BENTALL PROCEDURE: 51mm St Jude Valve Conduit ;  Surgeon: Gaye Pollack, MD;  Location: [place];  Service: Open Heart Surgery;  Laterality: N/A;  . Ascending aortic root replacement N/A  06/02/2014    Procedure: Aortic Arch Replacement with 29mm Hemasheild Graft;  Surgeon: Gaye Pollack, MD;  Location: [place];  Service: Open Heart Surgery;  Laterality: N/A;  . Intraoperative transesophageal echocardiogram N/A 06/02/2014    Procedure: INTRAOPERATIVE TRANSESOPHAGEAL ECHOCARDIOGRAM;  Surgeon: Gaye Pollack, MD;  Location: [facility] OR;  Service: Open Heart Surgery;  Laterality: N/A;  . Subxyphoid pericardial window N/A 06/27/2014    Procedure: SUBXYPHOID PERICARDIAL WINDOW;  Surgeon: Gaye Pollack, MD;  Location: [place];  Service: Thoracic;  Laterality: N/A;  . Intraoperative transesophageal echocardiogram N/A 06/27/2014    Procedure: INTRAOPERATIVE TRANSESOPHAGEAL ECHOCARDIOGRAM;  Surgeon: Gaye Pollack, MD;  Location: [facility] OR;  Service: Open Heart  Surgery;  Laterality: N/A;  . Cardiac surgery    . Left and right heart catheterization with coronary angiogram N/A 05/15/2014    Procedure: LEFT AND RIGHT HEART CATHETERIZATION WITH CORONARY ANGIOGRAM;  Surgeon: Peter M Martinique, MD;  Location: [facility] CATH LAB;  Service: Cardiovascular;  Laterality: N/A;     Allergies  Allergen Reactions  . Ivp Dye [Iodinated Diagnostic Agents] Nausea And Vomiting    ORAL LIQUID   . Morphine And Related Other (See Comments)    Not in right state of mind   . Shellfish-Derived Products Other (See Comments)    exacerbates gout      Family History  Problem Relation Age of Onset  . Heart failure Mother   . Diabetes Mother   . Hypertension Mother   . Hyperlipidemia Mother   . COPD Mother   . Cancer Father     lung & prostate  . Hyperlipidemia Father   . Heart disease Father   . Coronary artery disease Father   . Heart attack Neg Hx      Social History Mr. Redner reports that he has never smoked. He has never used smokeless tobacco. Mr. Hasegawa reports that he does not drink alcohol.   Review of Systems CONSTITUTIONAL: No weight loss, fever, chills, weakness or fatigue.  HEENT: Eyes: No visual loss, blurred vision, double vision or yellow sclerae.No hearing loss, sneezing, congestion, runny nose or sore throat.  SKIN: No rash or itching.  CARDIOVASCULAR: per HPI RESPIRATORY: No shortness of breath, cough or sputum.  GASTROINTESTINAL: No anorexia, nausea, vomiting or diarrhea. No abdominal pain or blood.  GENITOURINARY: No burning on urination, no polyuria NEUROLOGICAL: No headache, dizziness, syncope, paralysis, ataxia, numbness or tingling in the extremities. No change in bowel or bladder control.  MUSCULOSKELETAL: No muscle, back pain, joint pain or stiffness.  LYMPHATICS: No enlarged nodes. No history of splenectomy.  PSYCHIATRIC: No history of depression or anxiety.  ENDOCRINOLOGIC: No reports of sweating, cold or heat intolerance. No  polyuria or polydipsia.  Marland Kitchen   Physical Examination p 81 bp 114/76 Wt 282 lbs BMI 43 Gen: resting comfortably, no acute distress HEENT: no scleral icterus, pupils equal round and reactive, no palptable cervical adenopathy,  CV: RRR, mechanical S2, no m/r/g,no JVD Resp: Clear to auscultation bilaterally GI: abdomen is soft, non-tender, non-distended, normal bowel sounds, no hepatosplenomegaly MSK: extremities are warm, no edema.  Skin: warm, no rash Neuro:  no focal deficits Psych: appropriate affect   Diagnostic Studies 06/2014 Echo  Study Conclusions  - Left ventricle: Systolic function was severely reduced. The estimated ejection fraction was in the range of 25% to 30%. - Pericardium, extracardiac: A moderate to large, free-flowing pericardial effusion was identified. There was no evidence of hemodynamic compromise.  05/2014 Cath  Procedural Findings:  Hemodynamics  RA 24/22 mean 20 mm Hg  RV 47/16 mm Hg  PA 41/31 mean 35 mm Hg  PCWP 27/25 mean 25 mm Hg  LV 164/24 mm Hg  AO 106/77 mean 95 mm Hg  AV gradient: Peak-58 mm Hg, mean 27 mm Hg  AV area- 1.5 cm squared with index 0.6.  Oxygen saturations:  PA 71%  AO 98%  Cardiac Output (Fick) 6.0 L/min  Cardiac Index (Fick) 2.57 L/min/meter squared.  Coronary angiography:  Coronary dominance: left  Left mainstem: Normal  Left anterior descending (LAD): Normal  Left circumflex (LCx): 20% ostial disease. Otherwise normal. Large dominant vessel.  Right coronary artery (RCA): small nondominant vessel. Normal.  Left ventriculography: Left ventricular systolic function is abnormal, LVEF is estimated at 25-30%. The LV is enlarged with global hypokinesis. There is no significant mitral regurgitation  Aortic root angiography: The AV is heavily calcified and deformed with reduced mobility. 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  Final Conclusions:  1. Normal coronary anatomy.  2.  Severe LV dysfunction.  3. Severe aortic stenosis.  4. Proximal aortic root aneurysm.  5. Mild pulmonary HTN with elevated LV filling pressures.  Recommendations: Referral to CT surgery for Bentall procedure.     Assessment and Plan   1. Chronic systolic HF/NICM  - LVEF 40-35% by echo 06/2014, recent cath with patent coronaries  - etiology may be related to severe AS, vs NICM (genetic, postinfectoius etc)  - Medical therapy has been somewhat limited initially by low bp's. Will ask him again to increase Toprol XL to 50mg  daily - once optimzed on medical therapy will need repeat echo, possible consideration for ICD      [name], M.D.

## 2014-12-01 NOTE — Patient Instructions (Addendum)
   Increase Toprol XL to 50mg  daily  Increase Lisinopril to 5mg  daily   New prescriptions sent to pharmacy on above. Continue all other medications.   Follow up in  1 month

## 2014-12-04 ENCOUNTER — Encounter: Payer: Self-pay | Admitting: *Deleted

## 2014-12-04 ENCOUNTER — Other Ambulatory Visit: Payer: Self-pay | Admitting: *Deleted

## 2014-12-04 DIAGNOSIS — I359 Nonrheumatic aortic valve disorder, unspecified: Secondary | ICD-10-CM

## 2014-12-04 DIAGNOSIS — Z5181 Encounter for therapeutic drug level monitoring: Secondary | ICD-10-CM

## 2014-12-04 LAB — POCT INR
INR: 3.7
INR: 3.7

## 2014-12-04 MED ORDER — WARFARIN SODIUM 5 MG PO TABS: 5.0000 mg | ORAL_TABLET | Freq: Every day | ORAL | Status: DC

## 2014-12-04 NOTE — Progress Notes (Signed)
This encounter was created in error - please disregard.

## 2014-12-20 ENCOUNTER — Other Ambulatory Visit (HOSPITAL_COMMUNITY): Payer: Self-pay | Admitting: Respiratory Therapy

## 2014-12-20 DIAGNOSIS — G4730 Sleep apnea, unspecified: Secondary | ICD-10-CM

## 2014-12-22 ENCOUNTER — Encounter: Payer: Self-pay | Admitting: *Deleted

## 2014-12-28 ENCOUNTER — Ambulatory Visit (INDEPENDENT_AMBULATORY_CARE_PROVIDER_SITE_OTHER): Payer: Self-pay | Admitting: *Deleted

## 2014-12-28 DIAGNOSIS — Z954 Presence of other heart-valve replacement: Secondary | ICD-10-CM

## 2014-12-28 DIAGNOSIS — I359 Nonrheumatic aortic valve disorder, unspecified: Secondary | ICD-10-CM

## 2014-12-28 DIAGNOSIS — Z952 Presence of prosthetic heart valve: Secondary | ICD-10-CM

## 2014-12-28 DIAGNOSIS — Z5181 Encounter for therapeutic drug level monitoring: Secondary | ICD-10-CM

## 2014-12-28 LAB — POCT INR: INR: 1.2

## 2015-01-01 ENCOUNTER — Encounter: Payer: Self-pay | Admitting: Cardiology

## 2015-01-01 ENCOUNTER — Ambulatory Visit (INDEPENDENT_AMBULATORY_CARE_PROVIDER_SITE_OTHER): Payer: Self-pay | Admitting: Cardiology

## 2015-01-01 VITALS — BP 128/80 | HR 84 | Ht 68.0 in | Wt 284.0 lb

## 2015-01-01 DIAGNOSIS — I5022 Chronic systolic (congestive) heart failure: Secondary | ICD-10-CM

## 2015-01-01 DIAGNOSIS — J40 Bronchitis, not specified as acute or chronic: Secondary | ICD-10-CM

## 2015-01-01 MED ORDER — COLCHICINE 0.6 MG PO TABS: 0.6000 mg | ORAL_TABLET | Freq: Two times a day (BID) | ORAL | Status: AC

## 2015-01-01 MED ORDER — AZITHROMYCIN 250 MG PO TABS: ORAL_TABLET | ORAL | Status: DC

## 2015-01-01 NOTE — Progress Notes (Signed)
Clinical Summary Mr. Cyr is a 49 y.o.male seen today for follow up of the following medical problems. This is a focused visit on his history of chronic systolic heart failure.   1. Chronic systolic HF  - echo 03/18/101 LVEF 25-30%, moderate pericardial effusion  - NICM, cath 05/2014 with patent coronaries.  - he is compliant with his meds. Medical therapy initially limited by low blood pressures.Has tolerated recent med changes. - denies any significnat SOB, DOE, LE edema - last visit increased Toprol XL to 50mg  daily. Mild lightheadedness and fatigue that is overall tolerable.  -  2. OSA screen -.upcoming sleep study Friday  3. Cough - productive cough x 2 weeks with brown sputum - no fevers or chills.  Past Medical History  Diagnosis Date  . Diverticulitis   . Diverticulitis of sigmoid colon 04/07/2013    With perforation  . Obesity, Class III, BMI 40-49.9 (morbid obesity) 04/07/2013  . 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 Aortic arch aneurysm 2015  . Aortic stenosis 2015    s/p Bentall with mechanical (St Jude AVR) 05/2014  . Personal history of kidney stones   . GERD (gastroesophageal reflux disease)   . Claustrophobia     does not like anything put over his face  . NICM (nonischemic cardiomyopathy)     Echo (06/12/14):  Moderate LVH, EF 30-35%, mechanical AVR ok (mean 11 mm Hg), mild LAE, mildly reduced RVSF, mild to moderate RAE, moderate pericardial effusion  . Carotid stenosis     Carotid US (05/31/14):  Bilateral ICA 1-39%  . Hx of cardiac cath     a. LHC (05/15/14):  Ostial circumflex 20%, EF 25-30%  . Pericardial effusion     hemorrhagic after Bentall >>> s/p subxiphoid pericardial window 06/2014      Allergies  Allergen Reactions  . Ivp Dye [Iodinated Diagnostic Agents] Nausea And Vomiting    ORAL LIQUID   . Morphine And Related Other (See Comments)    Not in right state of mind   . Shellfish-Derived Products Other (See Comments)    exacerbates  gout     Current Outpatient Prescriptions  Medication Sig Dispense Refill  . allopurinol (ZYLOPRIM) 100 MG tablet Take 1 tablet (100 mg total) by mouth daily as needed (for gout). 30 tablet 0  . aspirin EC 81 MG EC tablet Take 1 tablet (81 mg total) by mouth daily.    . colchicine 0.6 MG tablet Take 1 tablet (0.6 mg total) by mouth daily. 14 tablet 0  . lisinopril (PRINIVIL,ZESTRIL) 5 MG tablet Take 1 tablet (5 mg total) by mouth daily. 30 tablet 6  . metoprolol succinate (TOPROL-XL) 50 MG 24 hr tablet Take 1 tablet (50 mg total) by mouth daily. 30 tablet 6  . warfarin (COUMADIN) 5 MG tablet Take 1-1.5 tablets (5-7.5 mg total) by mouth daily. Take as directed per coumadin clinic. 45 tablet 2   No current facility-administered medications for this visit.     Past Surgical History  Procedure Laterality Date  . Tonsillectomy    . Appendectomy      8-36 years old - Dr. Lennie Hummer  . Hernia repair  06/15/5365    umbilical  - At [facility], Dr. Lennie Hummer, Repair of umbilical hernia with mesh.  . Lithotripsy    . Bentall procedure N/A 06/02/2014    Procedure: BENTALL PROCEDURE: 39mm St Jude Valve Conduit ;  Surgeon: Gaye Pollack, MD;  Location: [place];  Service: Open Heart Surgery;  Laterality: N/A;  . Ascending aortic root replacement N/A 06/02/2014    Procedure: Aortic Arch Replacement with 92mm Hemasheild Graft;  Surgeon: Gaye Pollack, MD;  Location: [place];  Service: Open Heart Surgery;  Laterality: N/A;  . Intraoperative transesophageal echocardiogram N/A 06/02/2014    Procedure: INTRAOPERATIVE TRANSESOPHAGEAL ECHOCARDIOGRAM;  Surgeon: Gaye Pollack, MD;  Location: [facility] OR;  Service: Open Heart Surgery;  Laterality: N/A;  . Subxyphoid pericardial window N/A 06/27/2014    Procedure: SUBXYPHOID PERICARDIAL WINDOW;  Surgeon: Gaye Pollack, MD;  Location: [place];  Service: Thoracic;  Laterality: N/A;  . Intraoperative transesophageal echocardiogram N/A 06/27/2014    Procedure: INTRAOPERATIVE  TRANSESOPHAGEAL ECHOCARDIOGRAM;  Surgeon: Gaye Pollack, MD;  Location[facility] OR;  Service: Open Heart Surgery;  Laterality: N/A;  . Cardiac surgery    . Left and right heart catheterization with coronary angiogram N/A 05/15/2014    Procedure: LEFT AND RIGHT HEART CATHETERIZATION WITH CORONARY ANGIOGRAM;  Surgeon: Peter M Martinique, MD;  Location: [facility] CATH LAB;  Service: Cardiovascular;  Laterality: N/A;     Allergies  Allergen Reactions  . Ivp Dye [Iodinated Diagnostic Agents] Nausea And Vomiting    ORAL LIQUID   . Morphine And Related Other (See Comments)    Not in right state of mind   . Shellfish-Derived Products Other (See Comments)    exacerbates gout      Family History  Problem Relation Age of Onset  . Heart failure Mother   . Diabetes Mother   . Hypertension Mother   . Hyperlipidemia Mother   . COPD Mother   . Cancer Father     lung & prostate  . Hyperlipidemia Father   . Heart disease Father   . Coronary artery disease Father   . Heart attack Neg Hx      Social History Mr. Harren reports that he has never smoked. He has never used smokeless tobacco. Mr. Bischoff reports that he does not drink alcohol.   Review of Systems CONSTITUTIONAL: No weight loss, fever, chills, weakness or fatigue.  HEENT: Eyes: No visual loss, blurred vision, double vision or yellow sclerae.No hearing loss, sneezing, congestion, runny nose or sore throat.  SKIN: No rash or itching.  CARDIOVASCULAR: per HPI RESPIRATORY: + cough  GASTROINTESTINAL: No anorexia, nausea, vomiting or diarrhea. No abdominal pain or blood.  GENITOURINARY: No burning on urination, no polyuria NEUROLOGICAL: No headache, dizziness, syncope, paralysis, ataxia, numbness or tingling in the extremities. No change in bowel or bladder control.  MUSCULOSKELETAL: No muscle, back pain, joint pain or stiffness.  LYMPHATICS: No enlarged nodes. No history of splenectomy.  PSYCHIATRIC: No history of depression or anxiety.    ENDOCRINOLOGIC: No reports of sweating, cold or heat intolerance. No polyuria or polydipsia.  Marland Kitchen   Physical Examination Filed Vitals:   01/01/15 1018  BP: 128/80  Pulse: 84   Filed Weights   01/01/15 1018  Weight: 284 lb (128.822 kg)    Gen: resting comfortably, no acute distress HEENT: no scleral icterus, pupils equal round and reactive, no palptable cervical adenopathy,  CV: RRR, mechanical S2, no JVD, no carotid bruits Resp: Clear to auscultation bilaterally GI: abdomen is soft, non-tender, non-distended, normal bowel sounds, no hepatosplenomegaly MSK: extremities are warm, no edema.  Skin: warm, no rash Neuro:  no focal deficits Psych: appropriate affect   Diagnostic Studies 06/2014 Echo  Study Conclusions  - Left ventricle: Systolic function was severely reduced. The estimated ejection fraction was in  the range of 25% to 30%. - Pericardium, extracardiac: A moderate to large, free-flowing pericardial effusion was identified. There was no evidence of hemodynamic compromise.  05/2014 Cath  Procedural Findings:  Hemodynamics  RA 24/22 mean 20 mm Hg  RV 47/16 mm Hg  PA 41/31 mean 35 mm Hg  PCWP 27/25 mean 25 mm Hg  LV 164/24 mm Hg  AO 106/77 mean 95 mm Hg  AV gradient: Peak-58 mm Hg, mean 27 mm Hg  AV area- 1.5 cm squared with index 0.6.  Oxygen saturations:  PA 71%  AO 98%  Cardiac Output (Fick) 6.0 L/min  Cardiac Index (Fick) 2.57 L/min/meter squared.  Coronary angiography:  Coronary dominance: left  Left mainstem: Normal  Left anterior descending (LAD): Normal  Left circumflex (LCx): 20% ostial disease. Otherwise normal. Large dominant vessel.  Right coronary artery (RCA): small nondominant vessel. Normal.  Left ventriculography: Left ventricular systolic function is abnormal, LVEF is estimated at 25-30%. The LV is enlarged with global hypokinesis. There is no significant mitral regurgitation  Aortic root angiography: The AV is  heavily calcified and deformed with reduced mobility. 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  Final Conclusions:  1. Normal coronary anatomy.  2. Severe LV dysfunction.  3. Severe aortic stenosis.  4. Proximal aortic root aneurysm.  5. Mild pulmonary HTN with elevated LV filling pressures.  Recommendations: Referral to CT surgery for Bentall procedure.     Assessment and Plan  1. Chronic systolic HF/NICM  - LVEF 69-79% by echo 06/2014, recent cath with patent coronaries  - etiology may be related to severe AS (he is s/p AVR), vs NICM (genetic, postinfectoius etc)  - will increase Toprol XL to 50mg  bid - once optimzed on medical therapy will need repeat echo, possible consideration for ICD  2. OSA screen - has sleep study this coming Friday  3. Bronchitis - will give 5 day course of azithromycin. Will notify coumadin clinic.   F/u 3-4 weeks   [name], M.D.

## 2015-01-01 NOTE — Patient Instructions (Addendum)
Your physician recommends that you schedule a follow-up appointment in: 3-4 weeks with Dr. Harl Bowie  Your physician has recommended you make the following change in your medication:   INCREASE METOPROLOL TO 50 MG TWICE DAILY  START Z-PAK 500 MG TODAY AND 250 MG DAILY FOR 4 DAYS (5 DAY TOTAL)  WE HAVE REFILLED COLCHICINE FOR 30 DAYS WITH NO REFILLS  Thank you for choosing [facility]!!

## 2015-01-08 ENCOUNTER — Ambulatory Visit: Payer: 59 | Attending: Pulmonary Disease | Admitting: Sleep Medicine

## 2015-01-08 DIAGNOSIS — E669 Obesity, unspecified: Secondary | ICD-10-CM | POA: Insufficient documentation

## 2015-01-08 DIAGNOSIS — G4733 Obstructive sleep apnea (adult) (pediatric): Secondary | ICD-10-CM | POA: Insufficient documentation

## 2015-01-08 DIAGNOSIS — G4730 Sleep apnea, unspecified: Secondary | ICD-10-CM

## 2015-01-08 DIAGNOSIS — Z6841 Body Mass Index (BMI) 40.0 and over, adult: Secondary | ICD-10-CM | POA: Insufficient documentation

## 2015-01-08 DIAGNOSIS — G4710 Hypersomnia, unspecified: Secondary | ICD-10-CM | POA: Diagnosis present

## 2015-01-13 NOTE — Sleep Study (Signed)
  [place] A. Merlene Laughter, MD     [email or web address]        NOCTURNAL POLYSOMNOGRAM    LOCATION: SLEEP LAB FACILITY: [facility]   PHYSICIAN: Odesser Tourangeau A. Merlene Laughter, M.D.   DATE OF STUDY: 01/08/2014.   REFERRING PHYSICIAN: Sinda Du.   INDICATIONS: The patient is a 49 year old who presents with hypersomnia, snoring and obesity.  MEDICATIONS:  Prior to Admission medications   Medication Sig Start Date End Date Taking? Authorizing Provider  allopurinol (ZYLOPRIM) 100 MG tablet Take 1 tablet (100 mg total) by mouth daily as needed (for gout). 06/13/14   [name], PA-C  aspirin EC 81 MG EC tablet Take 1 tablet (81 mg total) by mouth daily. 06/09/14   [name], PA-C  azithromycin (ZITHROMAX) 250 MG tablet Take 2 tablets today 01/01/15, then 1 tab daily for 4 days. 01/01/15   [name], MD  colchicine 0.6 MG tablet Take 1 tablet (0.6 mg total) by mouth 2 (two) times daily. 01/01/15   [name], MD  lisinopril (PRINIVIL,ZESTRIL) 5 MG tablet Take 1 tablet (5 mg total) by mouth daily. 12/01/14   [name], MD  metoprolol succinate (TOPROL-XL) 50 MG 24 hr tablet Take 1 tablet (50 mg total) by mouth daily. 12/01/14   [name], MD  warfarin (COUMADIN) 5 MG tablet Take 1-1.5 tablets (5-7.5 mg total) by mouth daily. Take as directed per coumadin clinic. 12/04/14   [name], MD      EPWORTH SLEEPINESS SCALE: 16.   BMI: 43.   ARCHITECTURAL SUMMARY: Total recording time was 428 minutes. Sleep efficiency 84 %. Sleep latency 10 minutes. REM latency 157 minutes. Stage NI 5 %, N2 58 % and N3 19 % and REM sleep 19 %.    RESPIRATORY DATA:  Baseline oxygen saturation is 96 %. The lowest saturation is 85 %. The diagnostic AHI is 48. The patient was placed on positive pressure starting at 5 and increased to 15. The optimal pressure is 15 with resolution of obstructive events and good tolerance.   LIMB MOVEMENT SUMMARY: PLM  index 0.   ELECTROCARDIOGRAM SUMMARY: Average heart rate is 71 with no significant dysrhythmias observed.   IMPRESSION:  1. Severe obstructive sleep apnea syndrome which responds well to a CPAP of 15.  Thanks for this referral.  Kimiye Strathman A. Merlene Laughter, M.D. Diplomat, Tax adviser of Sleep Medicine.

## 2015-02-05 ENCOUNTER — Telehealth: Payer: Self-pay | Admitting: *Deleted

## 2015-02-05 MED ORDER — METOPROLOL SUCCINATE ER 50 MG PO TB24: 50.0000 mg | ORAL_TABLET | Freq: Two times a day (BID) | ORAL | Status: DC

## 2015-02-05 NOTE — Telephone Encounter (Signed)
Refill clarification of metoprolol 50 mg twice daily.  Dose increase on 01/01/2015. Medication sent to [facility]

## 2015-02-13 ENCOUNTER — Ambulatory Visit (INDEPENDENT_AMBULATORY_CARE_PROVIDER_SITE_OTHER): Payer: PRIVATE HEALTH INSURANCE | Admitting: *Deleted

## 2015-02-13 ENCOUNTER — Encounter: Payer: Self-pay | Admitting: Cardiology

## 2015-02-13 ENCOUNTER — Ambulatory Visit (INDEPENDENT_AMBULATORY_CARE_PROVIDER_SITE_OTHER): Payer: PRIVATE HEALTH INSURANCE | Admitting: Cardiology

## 2015-02-13 VITALS — BP 101/65 | HR 84 | Ht 68.0 in | Wt 291.1 lb

## 2015-02-13 DIAGNOSIS — Z952 Presence of prosthetic heart valve: Secondary | ICD-10-CM

## 2015-02-13 DIAGNOSIS — I5021 Acute systolic (congestive) heart failure: Secondary | ICD-10-CM

## 2015-02-13 DIAGNOSIS — I359 Nonrheumatic aortic valve disorder, unspecified: Secondary | ICD-10-CM

## 2015-02-13 DIAGNOSIS — Z954 Presence of other heart-valve replacement: Secondary | ICD-10-CM

## 2015-02-13 DIAGNOSIS — Z5181 Encounter for therapeutic drug level monitoring: Secondary | ICD-10-CM

## 2015-02-13 LAB — POCT INR: INR: 1.4

## 2015-02-13 MED ORDER — FUROSEMIDE 20 MG PO TABS: ORAL_TABLET | ORAL | Status: DC

## 2015-02-13 MED ORDER — METOPROLOL SUCCINATE ER 50 MG PO TB24: ORAL_TABLET | ORAL | Status: AC

## 2015-02-13 NOTE — Progress Notes (Signed)
Clinical Summary Alec Kelly is a 49 y.o.male seen today for follow up of the following medical problems. This is a focused visit on his history of chronic systolic HF>   1. Chronic systolic HF  - echo 03/18/1539 LVEF 25-30%, moderate pericardial effusion  - NICM, cath 05/2014 with patent coronaries.    last visit increased Toprol XL to 50mg  bid. Since change notes significant increase in fatigue. Has also had some lightheadedness.  - notes some increased LE edema. Wt is up from 284 to 291 by our scales. Has some orthopnea.   2. OSA screen - found to have sleep apnea, awaiting CPAP machine.      Past Medical History  Diagnosis Date  . Diverticulitis   . Diverticulitis of sigmoid colon 04/07/2013    With perforation  . Obesity, Class III, BMI 40-49.9 (morbid obesity) 04/07/2013  . 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 Aortic arch aneurysm 2015  . Aortic stenosis 2015    s/p Bentall with mechanical (St Jude AVR) 05/2014  . Personal history of kidney stones   . GERD (gastroesophageal reflux disease)   . Claustrophobia     does not like anything put over his face  . NICM (nonischemic cardiomyopathy)     Echo (06/12/14):  Moderate LVH, EF 30-35%, mechanical AVR ok (mean 11 mm Hg), mild LAE, mildly reduced RVSF, mild to moderate RAE, moderate pericardial effusion  . Carotid stenosis     Carotid US (05/31/14):  Bilateral ICA 1-39%  . Hx of cardiac cath     a. LHC (05/15/14):  Ostial circumflex 20%, EF 25-30%  . Pericardial effusion     hemorrhagic after Bentall >>> s/p subxiphoid pericardial window 06/2014      Allergies  Allergen Reactions  . Ivp Dye [Iodinated Diagnostic Agents] Nausea And Vomiting    ORAL LIQUID   . Morphine And Related Other (See Comments)    Not in right state of mind   . Shellfish-Derived Products Other (See Comments)    exacerbates gout     Current Outpatient Prescriptions  Medication Sig Dispense Refill  . allopurinol (ZYLOPRIM) 100 MG  tablet Take 1 tablet (100 mg total) by mouth daily as needed (for gout). 30 tablet 0  . aspirin EC 81 MG EC tablet Take 1 tablet (81 mg total) by mouth daily.    Marland Kitchen azithromycin (ZITHROMAX) 250 MG tablet Take 2 tablets today 01/01/15, then 1 tab daily for 4 days. 6 each 0  . colchicine 0.6 MG tablet Take 1 tablet (0.6 mg total) by mouth 2 (two) times daily. 30 tablet 3  . lisinopril (PRINIVIL,ZESTRIL) 5 MG tablet Take 1 tablet (5 mg total) by mouth daily. 30 tablet 6  . metoprolol succinate (TOPROL-XL) 50 MG 24 hr tablet Take 1 tablet (50 mg total) by mouth 2 (two) times daily. 30 tablet 6  . warfarin (COUMADIN) 5 MG tablet Take 1-1.5 tablets (5-7.5 mg total) by mouth daily. Take as directed per coumadin clinic. 45 tablet 2   No current facility-administered medications for this visit.     Past Surgical History  Procedure Laterality Date  . Tonsillectomy    . Appendectomy      69-48 years old - Dr. Lennie Hummer  . Hernia repair  0/07/6760    umbilical  - At [facility], Dr. Lennie Hummer, Repair of umbilical hernia with mesh.  . Lithotripsy    . Bentall procedure N/A 06/02/2014    Procedure: BENTALL PROCEDURE:  67mm St Jude Valve Conduit ;  Surgeon: Gaye Pollack, MD;  Location: [place];  Service: Open Heart Surgery;  Laterality: N/A;  . Ascending aortic root replacement N/A 06/02/2014    Procedure: Aortic Arch Replacement with 64mm Hemasheild Graft;  Surgeon: Gaye Pollack, MD;  Location: [place];  Service: Open Heart Surgery;  Laterality: N/A;  . Intraoperative transesophageal echocardiogram N/A 06/02/2014    Procedure: INTRAOPERATIVE TRANSESOPHAGEAL ECHOCARDIOGRAM;  Surgeon: Gaye Pollack, MD;  Location: [facility] OR;  Service: Open Heart Surgery;  Laterality: N/A;  . Subxyphoid pericardial window N/A 06/27/2014    Procedure: SUBXYPHOID PERICARDIAL WINDOW;  Surgeon: Gaye Pollack, MD;  Location: [place];  Service: Thoracic;  Laterality: N/A;  . Intraoperative transesophageal echocardiogram N/A 06/27/2014    Procedure:  INTRAOPERATIVE TRANSESOPHAGEAL ECHOCARDIOGRAM;  Surgeon: Gaye Pollack, MD;  Location: [facility] OR;  Service: Open Heart Surgery;  Laterality: N/A;  . Cardiac surgery    . Left and right heart catheterization with coronary angiogram N/A 05/15/2014    Procedure: LEFT AND RIGHT HEART CATHETERIZATION WITH CORONARY ANGIOGRAM;  Surgeon: Peter M Martinique, MD;  Location: [facility] CATH LAB;  Service: Cardiovascular;  Laterality: N/A;     Allergies  Allergen Reactions  . Ivp Dye [Iodinated Diagnostic Agents] Nausea And Vomiting    ORAL LIQUID   . Morphine And Related Other (See Comments)    Not in right state of mind   . Shellfish-Derived Products Other (See Comments)    exacerbates gout      Family History  Problem Relation Age of Onset  . Heart failure Mother   . Diabetes Mother   . Hypertension Mother   . Hyperlipidemia Mother   . COPD Mother   . Cancer Father     lung & prostate  . Hyperlipidemia Father   . Heart disease Father   . Coronary artery disease Father   . Heart attack Neg Hx      Social History Alec Kelly reports that he has never smoked. He has never used smokeless tobacco. Alec Kelly reports that he does not drink alcohol.   Review of Systems CONSTITUTIONAL:+ fatigue HEENT: Eyes: No visual loss, blurred vision, double vision or yellow sclerae.No hearing loss, sneezing, congestion, runny nose or sore throat.  SKIN: No rash or itching.  CARDIOVASCULAR: per HPI RESPIRATORY: No shortness of breath, cough or sputum.  GASTROINTESTINAL: No anorexia, nausea, vomiting or diarrhea. No abdominal pain or blood.  GENITOURINARY: No burning on urination, no polyuria NEUROLOGICAL: No headache, dizziness, syncope, paralysis, ataxia, numbness or tingling in the extremities. No change in bowel or bladder control.  MUSCULOSKELETAL: No muscle, back pain, joint pain or stiffness.  LYMPHATICS: No enlarged nodes. No history of splenectomy.  PSYCHIATRIC: No history of depression or anxiety.    ENDOCRINOLOGIC: No reports of sweating, cold or heat intolerance. No polyuria or polydipsia.  Marland Kitchen   Physical Examination p 84 bp 101/65 Wt 291 lbs BMI 44 Gen: resting comfortably, no acute distress HEENT: no scleral icterus, pupils equal round and reactive, no palptable cervical adenopathy,  CV: RRR, no m/r/g, no JVD, no carotid bruits Resp: Clear to auscultation bilaterally GI: abdomen is soft, non-tender, non-distended, normal bowel sounds, no hepatosplenomegaly MSK: extremities are warm, 1+ bilateral edema Skin: warm, no rash Neuro:  no focal deficits Psych: appropriate affect   Diagnostic Studies 06/2014 Echo  Study Conclusions  - Left ventricle: Systolic function was severely reduced. The estimated ejection fraction was in the range of 25% to 30%. -  Pericardium, extracardiac: A moderate to large, free-flowing pericardial effusion was identified. There was no evidence of hemodynamic compromise.  05/2014 Cath  Procedural Findings:  Hemodynamics  RA 24/22 mean 20 mm Hg  RV 47/16 mm Hg  PA 41/31 mean 35 mm Hg  PCWP 27/25 mean 25 mm Hg  LV 164/24 mm Hg  AO 106/77 mean 95 mm Hg  AV gradient: Peak-58 mm Hg, mean 27 mm Hg  AV area- 1.5 cm squared with index 0.6.  Oxygen saturations:  PA 71%  AO 98%  Cardiac Output (Fick) 6.0 L/min  Cardiac Index (Fick) 2.57 L/min/meter squared.  Coronary angiography:  Coronary dominance: left  Left mainstem: Normal  Left anterior descending (LAD): Normal  Left circumflex (LCx): 20% ostial disease. Otherwise normal. Large dominant vessel.  Right coronary artery (RCA): small nondominant vessel. Normal.  Left ventriculography: Left ventricular systolic function is abnormal, LVEF is estimated at 25-30%. The LV is enlarged with global hypokinesis. There is no significant mitral regurgitation  Aortic root angiography: The AV is heavily calcified and deformed with reduced mobility. The entire aortic root is aneurysmal and  moderately dilated.  Final Conclusions:  1. Normal coronary anatomy.  2. Severe LV dysfunction.  3. Severe aortic stenosis.  4. Proximal aortic root aneurysm.  5. Mild pulmonary HTN with elevated LV filling pressures.  Recommendations: Referral to CT surgery for Bentall procedure.     Assessment and Plan  1. Chronic systolic HF/NICM  - LVEF 91-66% by echo 06/2014, recent cath with patent coronaries  - etiology may be related to severe AS (he is s/p AVR), vs NICM (genetic, postinfectoius etc)  - significant fatigue on Toprol XL 50mg  bid, will change to 50mg  in AM and 25mg  in PM - repeat echo - evidence of volume overload, will start lasix 20mg  prn.   2. OSA  - awaiting CPAP machine   F/u 3 weeks.    [name], M.D.

## 2015-02-13 NOTE — Patient Instructions (Signed)
Your physician recommends that you schedule a follow-up appointment in: 3-4 weeks with Dr. Harl Bowie  Your physician has recommended you make the following change in your medication:   START LASIX 20 MG DAILY AS NEEDED  DECREASE TOPROL 50 MG IN THE MORNING AND 25 MG IN THE EVENING  CONTINUE ALL OTHER MEDICATIONS AS DIRECTED  Your physician has requested that you have an echocardiogram. Echocardiography is a painless test that uses sound waves to create images of your heart. It provides your doctor with information about the size and shape of your heart and how well your heart's chambers and valves are working. This procedure takes approximately one hour. There are no restrictions for this procedure.  Thank you for choosing [facility]!!

## 2015-02-16 ENCOUNTER — Ambulatory Visit (HOSPITAL_COMMUNITY)
Admission: RE | Admit: 2015-02-16 | Discharge: 2015-02-16 | Disposition: A | Discharge status: Home / Self Care | Payer: 59 | Source: Ambulatory Visit | Attending: Cardiology | Admitting: Cardiology

## 2015-02-16 DIAGNOSIS — I5021 Acute systolic (congestive) heart failure: Secondary | ICD-10-CM

## 2015-02-16 DIAGNOSIS — I509 Heart failure, unspecified: Secondary | ICD-10-CM

## 2015-02-16 NOTE — Progress Notes (Signed)
*  PRELIMINARY RESULTS* Echocardiogram 2D Echocardiogram has been performed.  Leavy Cella 02/16/2015, 3:48 PM

## 2015-02-19 ENCOUNTER — Telehealth: Payer: Self-pay | Admitting: *Deleted

## 2015-02-19 NOTE — Telephone Encounter (Signed)
-----   Message from [name], MD sent at 02/19/2015 12:50 PM EST ----- Echo shows that heart function has significantly improved and is nearly back to normal. Will discuss more at next follow up.  Zandra Abts MD

## 2015-02-19 NOTE — Telephone Encounter (Signed)
Pt made aware of result and f/u visit

## 2015-02-22 ENCOUNTER — Ambulatory Visit (INDEPENDENT_AMBULATORY_CARE_PROVIDER_SITE_OTHER): Payer: PRIVATE HEALTH INSURANCE | Admitting: *Deleted

## 2015-02-22 DIAGNOSIS — Z954 Presence of other heart-valve replacement: Secondary | ICD-10-CM

## 2015-02-22 DIAGNOSIS — Z5181 Encounter for therapeutic drug level monitoring: Secondary | ICD-10-CM

## 2015-02-22 DIAGNOSIS — Z952 Presence of prosthetic heart valve: Secondary | ICD-10-CM

## 2015-02-22 DIAGNOSIS — I359 Nonrheumatic aortic valve disorder, unspecified: Secondary | ICD-10-CM

## 2015-02-22 LAB — POCT INR: INR: 1.6

## 2015-03-09 ENCOUNTER — Ambulatory Visit (INDEPENDENT_AMBULATORY_CARE_PROVIDER_SITE_OTHER): Payer: 59 | Admitting: Pharmacist

## 2015-03-09 ENCOUNTER — Ambulatory Visit (INDEPENDENT_AMBULATORY_CARE_PROVIDER_SITE_OTHER): Payer: 59 | Admitting: Cardiology

## 2015-03-09 ENCOUNTER — Telehealth: Payer: Self-pay | Admitting: *Deleted

## 2015-03-09 ENCOUNTER — Encounter: Payer: Self-pay | Admitting: Cardiology

## 2015-03-09 VITALS — BP 120/74 | HR 63 | Ht 68.0 in | Wt 291.0 lb

## 2015-03-09 DIAGNOSIS — I5022 Chronic systolic (congestive) heart failure: Secondary | ICD-10-CM | POA: Diagnosis not present

## 2015-03-09 DIAGNOSIS — I359 Nonrheumatic aortic valve disorder, unspecified: Secondary | ICD-10-CM

## 2015-03-09 DIAGNOSIS — Z952 Presence of prosthetic heart valve: Secondary | ICD-10-CM

## 2015-03-09 DIAGNOSIS — Z5181 Encounter for therapeutic drug level monitoring: Secondary | ICD-10-CM

## 2015-03-09 DIAGNOSIS — N521 Erectile dysfunction due to diseases classified elsewhere: Secondary | ICD-10-CM

## 2015-03-09 DIAGNOSIS — G4733 Obstructive sleep apnea (adult) (pediatric): Secondary | ICD-10-CM | POA: Diagnosis not present

## 2015-03-09 DIAGNOSIS — Z954 Presence of other heart-valve replacement: Secondary | ICD-10-CM | POA: Diagnosis not present

## 2015-03-09 LAB — POCT INR: INR: 3.2

## 2015-03-09 MED ORDER — FUROSEMIDE 40 MG PO TABS: 40.0000 mg | ORAL_TABLET | Freq: Every day | ORAL | Status: AC

## 2015-03-09 MED ORDER — SILDENAFIL CITRATE 100 MG PO TABS: ORAL_TABLET | ORAL | Status: AC

## 2015-03-09 NOTE — Telephone Encounter (Signed)
Pt INR 3.2 will forward to Coumadin in [facility] st

## 2015-03-09 NOTE — Patient Instructions (Signed)
Your physician recommends that you schedule a follow-up appointment in: 4 months with Dr. Harl Bowie  Your physician has recommended you make the following change in your medication:   INCREASE LASIX TO 40 MG DAILY AS NEEDED  START VIAGRA 100 MG TAKE 1/2 TABLETS 30 MINS PRIOR.  CONTINUE ALL OTHER MEDICATIONS AS DIRECTED  Thank you for choosing [facility]!!

## 2015-03-09 NOTE — Progress Notes (Signed)
Clinical Summary  Mr. Savini is a 49 y.o.male seen today for follow up of the following medical problems. This is a focused visit on his history of chronic systolic HF   1. Chronic systolic HF  - echo 03/16/7407 LVEF 25-30%, moderate pericardial effusion  - NICM, cath 05/2014 with patent coronaries.  - repeat echo 02/2015 after AVR and medical therapy LVEF has normalized at 50-55%.   last visit he had developed significant fatigue with increasing his Toprol to 50mg  bid. We decreased it to 50 in AM and 25 in PM and symptoms have resolved.  - he also had increased LE edema and weight gain from 284 to 291, started on lasix 20mg  prn however swelling has not improved.    2. OSA screen - found to have sleep apnea, just received CPAP machine but has not started.      Past Medical History  Diagnosis Date  . Diverticulitis   . Diverticulitis of sigmoid colon 04/07/2013    With perforation  . Obesity, Class III, BMI 40-49.9 (morbid obesity) 04/07/2013  . 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 Aortic arch aneurysm 2015  . Aortic stenosis 2015    s/p Bentall with mechanical (St Jude AVR) 05/2014  . Personal history of kidney stones   . GERD (gastroesophageal reflux disease)   . Claustrophobia     does not like anything put over his face  . NICM (nonischemic cardiomyopathy)     Echo (06/12/14):  Moderate LVH, EF 30-35%, mechanical AVR ok (mean 11 mm Hg), mild LAE, mildly reduced RVSF, mild to moderate RAE, moderate pericardial effusion  . Carotid stenosis     Carotid US (05/31/14):  Bilateral ICA 1-39%  . Hx of cardiac cath     a. LHC (05/15/14):  Ostial circumflex 20%, EF 25-30%  . Pericardial effusion     hemorrhagic after Bentall >>> s/p subxiphoid pericardial window 06/2014      Allergies  Allergen Reactions  . Ivp Dye [Iodinated Diagnostic Agents] Nausea And Vomiting    ORAL LIQUID   . Morphine And Related Other (See Comments)    Not in right state of mind   .  Shellfish-Derived Products Other (See Comments)    exacerbates gout     Current Outpatient Prescriptions  Medication Sig Dispense Refill  . allopurinol (ZYLOPRIM) 100 MG tablet Take 1 tablet (100 mg total) by mouth daily as needed (for gout). 30 tablet 0  . aspirin EC 81 MG EC tablet Take 1 tablet (81 mg total) by mouth daily.    . colchicine 0.6 MG tablet Take 1 tablet (0.6 mg total) by mouth 2 (two) times daily. 30 tablet 3  . furosemide (LASIX) 20 MG tablet Take 1 tab daily as needed 30 tablet 3  . lisinopril (PRINIVIL,ZESTRIL) 5 MG tablet Take 1 tablet (5 mg total) by mouth daily. 30 tablet 6  . metoprolol succinate (TOPROL-XL) 50 MG 24 hr tablet Take 50 mg AM and 25 mg PM 30 tablet 6  . warfarin (COUMADIN) 5 MG tablet Take 1-1.5 tablets (5-7.5 mg total) by mouth daily. Take as directed per coumadin clinic. 45 tablet 2   No current facility-administered medications for this visit.     Past Surgical History  Procedure Laterality Date  . Tonsillectomy    . Appendectomy      44-46 years old - Dr. Lennie Hummer  . Hernia repair  12/18/4816    umbilical  - At [facility], Dr.  Lennie Hummer, Repair of umbilical hernia with mesh.  . Lithotripsy    . Bentall procedure N/A 06/02/2014    Procedure: BENTALL PROCEDURE: 85mm St Jude Valve Conduit ;  Surgeon: Gaye Pollack, MD;  Location: [place];  Service: Open Heart Surgery;  Laterality: N/A;  . Ascending aortic root replacement N/A 06/02/2014    Procedure: Aortic Arch Replacement with 44mm Hemasheild Graft;  Surgeon: Gaye Pollack, MD;  Location: [place];  Service: Open Heart Surgery;  Laterality: N/A;  . Intraoperative transesophageal echocardiogram N/A 06/02/2014    Procedure: INTRAOPERATIVE TRANSESOPHAGEAL ECHOCARDIOGRAM;  Surgeon: Gaye Pollack, MD;  Location: [facility] OR;  Service: Open Heart Surgery;  Laterality: N/A;  . Subxyphoid pericardial window N/A 06/27/2014    Procedure: SUBXYPHOID PERICARDIAL WINDOW;  Surgeon: Gaye Pollack, MD;  Location: [place];   Service: Thoracic;  Laterality: N/A;  . Intraoperative transesophageal echocardiogram N/A 06/27/2014    Procedure: INTRAOPERATIVE TRANSESOPHAGEAL ECHOCARDIOGRAM;  Surgeon: Gaye Pollack, MD;  Location: [facility] OR;  Service: Open Heart Surgery;  Laterality: N/A;  . Cardiac surgery    . Left and right heart catheterization with coronary angiogram N/A 05/15/2014    Procedure: LEFT AND RIGHT HEART CATHETERIZATION WITH CORONARY ANGIOGRAM;  Surgeon: Peter M Martinique, MD;  Location: [facility] CATH LAB;  Service: Cardiovascular;  Laterality: N/A;     Allergies  Allergen Reactions  . Ivp Dye [Iodinated Diagnostic Agents] Nausea And Vomiting    ORAL LIQUID   . Morphine And Related Other (See Comments)    Not in right state of mind   . Shellfish-Derived Products Other (See Comments)    exacerbates gout      Family History  Problem Relation Age of Onset  . Heart failure Mother   . Diabetes Mother   . Hypertension Mother   . Hyperlipidemia Mother   . COPD Mother   . Cancer Father     lung & prostate  . Hyperlipidemia Father   . Heart disease Father   . Coronary artery disease Father   . Heart attack Neg Hx      Social History Mr. Kittleson reports that he has never smoked. He has never used smokeless tobacco. Mr. Mcgrady reports that he does not drink alcohol.   Review of Systems CONSTITUTIONAL: No weight loss, fever, chills, weakness or fatigue.  HEENT: Eyes: No visual loss, blurred vision, double vision or yellow sclerae.No hearing loss, sneezing, congestion, runny nose or sore throat.  SKIN: No rash or itching.  CARDIOVASCULAR: per HPI RESPIRATORY: No shortness of breath, cough or sputum.  GASTROINTESTINAL: No anorexia, nausea, vomiting or diarrhea. No abdominal pain or blood.  GENITOURINARY: No burning on urination, no polyuria NEUROLOGICAL: No headache, dizziness, syncope, paralysis, ataxia, numbness or tingling in the extremities. No change in bowel or bladder control.  MUSCULOSKELETAL: No  muscle, back pain, joint pain or stiffness.  LYMPHATICS: No enlarged nodes. No history of splenectomy.  PSYCHIATRIC: No history of depression or anxiety.  ENDOCRINOLOGIC: No reports of sweating, cold or heat intolerance. No polyuria or polydipsia.  Marland Kitchen   Physical Examination p 63 bp 120/74 Wt 291 lbs BMI 44 Gen: resting comfortably, no acute distress HEENT: no scleral icterus, pupils equal round and reactive, no palptable cervical adenopathy,  CV: RRR, mechanical S2, 1+ bilateral LE edema Resp: Clear to auscultation bilaterally GI: abdomen is soft, non-tender, non-distended, normal bowel sounds, no hepatosplenomegaly MSK: extremities are warm, no edema.  Skin: warm, no rash Neuro:  no focal deficits Psych: appropriate  affect   Diagnostic Studies  06/2014 Echo  Study Conclusions  - Left ventricle: Systolic function was severely reduced. The estimated ejection fraction was in the range of 25% to 30%. - Pericardium, extracardiac: A moderate to large, free-flowing pericardial effusion was identified. There was no evidence of hemodynamic compromise.  05/2014 Cath  Procedural Findings:  Hemodynamics  RA 24/22 mean 20 mm Hg  RV 47/16 mm Hg  PA 41/31 mean 35 mm Hg  PCWP 27/25 mean 25 mm Hg  LV 164/24 mm Hg  AO 106/77 mean 95 mm Hg  AV gradient: Peak-58 mm Hg, mean 27 mm Hg  AV area- 1.5 cm squared with index 0.6.  Oxygen saturations:  PA 71%  AO 98%  Cardiac Output (Fick) 6.0 L/min  Cardiac Index (Fick) 2.57 L/min/meter squared.  Coronary angiography:  Coronary dominance: left  Left mainstem: Normal  Left anterior descending (LAD): Normal  Left circumflex (LCx): 20% ostial disease. Otherwise normal. Large dominant vessel.  Right coronary artery (RCA): small nondominant vessel. Normal.  Left ventriculography: Left ventricular systolic function is abnormal, LVEF is estimated at 25-30%. The LV is enlarged with global hypokinesis. There is no significant  mitral regurgitation  Aortic root angiography: The AV is heavily calcified and deformed with reduced mobility. 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  Final Conclusions:  1. Normal coronary anatomy.  2. Severe LV dysfunction.  3. Severe aortic stenosis.  4. Proximal aortic root aneurysm.  5. Mild pulmonary HTN with elevated LV filling pressures.  Recommendations: Referral to CT surgery for Bentall procedure.    02/2015 Echo Study Conclusions  - Procedure narrative: Transthoracic echocardiography. Image quality was suboptimal. - Left ventricle: The cavity size was normal. There was mild concentric hypertrophy. Systolic function was normal. The estimated ejection fraction was in the range of 50% to 55%. Features are consistent with a pseudonormal left ventricular filling pattern, with concomitant abnormal relaxation and increased filling pressure (grade 2 diastolic dysfunction). Doppler parameters are consistent with high ventricular filling pressure. - Ventricular septum: Septal motion showed abnormal function and dyssynergy. These changes are consistent with a left bundle branch block. - Aortic valve: A normally functioning mechanical prosthesis was present. Peak velocity 2.73 m/s. Mean gradient (S): 17 mm Hg. - Mitral valve: There was trivial regurgitation. - Left atrium: The atrium was mildly dilated. Volume/bsa, ES, (1-plane Simpson&'s, A2C): 32.6 ml/m^2. - Right ventricle: Systolic function was mildly reduced. - Pericardium, extracardiac: Not well visualized. No obvious effusion.  Impressions:  - LV systolic function appears to have normalized based upon available images. However, would recommend contrast enhancement for a more accurate assessment of LV systolic function and regional wall motion if deemed clinically indicated. The mechanical aortic prosthesis was suboptimally visualized but there appeared  to be normal overall function. Pericardial effusion is no longer visualized.  Assessment and Plan   1. Chronic systolic HF/NICM  - LVEF 60-63% by echo 06/2014, has now normalized s/p AVR and medical therapy. LVEF 50-55% by ehco 02/2015 - continue current meds. Will increase lasix to 40mg  prn due to some LE edema  2. OSA  - he is starting cpap  3. Erectile dysfunciton - given Rx for viagra  4. AVR - continue coumadin, normal function by last echo   [name], M.D.

## 2015-03-12 NOTE — Telephone Encounter (Signed)
INR addressed by Coumadin clinic on 3/25

## 2015-03-22 ENCOUNTER — Ambulatory Visit (INDEPENDENT_AMBULATORY_CARE_PROVIDER_SITE_OTHER): Payer: 59 | Admitting: *Deleted

## 2015-03-22 DIAGNOSIS — Z5181 Encounter for therapeutic drug level monitoring: Secondary | ICD-10-CM | POA: Diagnosis not present

## 2015-03-22 DIAGNOSIS — I359 Nonrheumatic aortic valve disorder, unspecified: Secondary | ICD-10-CM | POA: Diagnosis not present

## 2015-03-22 DIAGNOSIS — Z954 Presence of other heart-valve replacement: Secondary | ICD-10-CM | POA: Diagnosis not present

## 2015-03-22 DIAGNOSIS — Z952 Presence of prosthetic heart valve: Secondary | ICD-10-CM

## 2015-03-22 LAB — POCT INR: INR: 3

## 2015-03-27 ENCOUNTER — Other Ambulatory Visit: Payer: Self-pay | Admitting: Cardiology

## 2015-03-27 ENCOUNTER — Other Ambulatory Visit: Payer: Self-pay | Admitting: *Deleted

## 2015-03-27 MED ORDER — WARFARIN SODIUM 5 MG PO TABS
ORAL_TABLET | ORAL | Status: AC
Start: 2015-03-27 — End: ?

## 2015-03-27 NOTE — Telephone Encounter (Signed)
Warfarin refilled today.

## 2015-04-03 ENCOUNTER — Encounter: Payer: Self-pay | Admitting: Nurse Practitioner

## 2015-04-16 ENCOUNTER — Encounter: Payer: Self-pay | Admitting: *Deleted

## 2015-04-17 ENCOUNTER — Ambulatory Visit (INDEPENDENT_AMBULATORY_CARE_PROVIDER_SITE_OTHER): Payer: 59 | Admitting: Nurse Practitioner

## 2015-04-17 ENCOUNTER — Telehealth: Payer: Self-pay | Admitting: *Deleted

## 2015-04-17 ENCOUNTER — Encounter: Payer: Self-pay | Admitting: Nurse Practitioner

## 2015-04-17 ENCOUNTER — Ambulatory Visit (HOSPITAL_COMMUNITY)
Admission: RE | Admit: 2015-04-17 | Discharge: 2015-04-17 | Disposition: A | Discharge status: Home / Self Care | Payer: 59 | Source: Ambulatory Visit | Attending: Nurse Practitioner | Admitting: Nurse Practitioner

## 2015-04-17 ENCOUNTER — Other Ambulatory Visit: Payer: Self-pay | Admitting: *Deleted

## 2015-04-17 VITALS — BP 100/60 | HR 76 | Ht 66.25 in | Wt 296.4 lb

## 2015-04-17 DIAGNOSIS — R1310 Dysphagia, unspecified: Secondary | ICD-10-CM | POA: Insufficient documentation

## 2015-04-17 DIAGNOSIS — Z1211 Encounter for screening for malignant neoplasm of colon: Secondary | ICD-10-CM | POA: Diagnosis not present

## 2015-04-17 DIAGNOSIS — K449 Diaphragmatic hernia without obstruction or gangrene: Secondary | ICD-10-CM | POA: Insufficient documentation

## 2015-04-17 DIAGNOSIS — Z7902 Long term (current) use of antithrombotics/antiplatelets: Secondary | ICD-10-CM | POA: Insufficient documentation

## 2015-04-17 MED ORDER — ENOXAPARIN SODIUM 150 MG/ML ~~LOC~~ SOLN: 150.0000 mg | Freq: Two times a day (BID) | SUBCUTANEOUS | Status: DC

## 2015-04-17 MED ORDER — NA SULFATE-K SULFATE-MG SULF 17.5-3.13-1.6 GM/177ML PO SOLN: ORAL | Status: DC

## 2015-04-17 NOTE — Progress Notes (Addendum)
HPI :   Patient is a 49 year old male referred by Alec Kelly at [facility] for a colonoscopy. He has a history of recurrent diverticulitis and recently saw surgery for an elective resection but has never had a colonoscopy. His last episode of diverticulitis was October 2016 (CTScan).  Patient tells me he was being worked up for a colon resection in 2015 when found to have systolic heart failure from AV problems.  Patient subsequently underwent heart surgery for aortic valve replacement and is now on chronic coumadin. Last echocardiogram  March 2016 showed that LVEF had normalized to 50-55%.  Patient is followed by Dr. Harl Bowie in [place].    Patient has no lower gastrointestinal complaints such as bowel changes, blood in stool. He does complain of belching and problems swallowing liquids. No coughing with ingestion of fluids but fluids sometimes "just don't go down well". No significant GERD symptoms.  Past Medical History  Diagnosis Date  . Diverticulosis   . Diverticulitis of sigmoid colon 04/07/2013    With perforation  . Obesity, Class III, BMI 40-49.9 (morbid obesity) 04/07/2013  . 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 Aortic arch aneurysm 2015  . Aortic stenosis 2015    s/p Bentall with mechanical (St Jude AVR) 05/2014  . Personal history of kidney stones   . GERD (gastroesophageal reflux disease)   . Claustrophobia     does not like anything put over his face  . NICM (nonischemic cardiomyopathy)     Echo (06/12/14):  Moderate LVH, EF 30-35%, mechanical AVR ok (mean 11 mm Hg), mild LAE, mildly reduced RVSF, mild to moderate RAE, moderate pericardial effusion  . Carotid stenosis     Carotid US (05/31/14):  Bilateral ICA 1-39%  . Hx of cardiac cath     a. LHC (05/15/14):  Ostial circumflex 20%, EF 25-30%  . Pericardial effusion     hemorrhagic after Bentall >>> s/p subxiphoid pericardial window 06/2014   . OSA on CPAP     on CPAP    Family History  Problem  Relation Age of Onset  . Heart failure Mother   . Diabetes Mother   . Hypertension Mother   . Hyperlipidemia Mother   . COPD Mother   . Prostate cancer Father   . Hyperlipidemia Father   . Heart disease Father   . Coronary artery disease Father   . Heart attack Neg Hx   . Diabetes Father   . Heart disease Mother   . Lung cancer Father    History  Substance Use Topics  . Smoking status: Never Smoker   . Smokeless tobacco: Never Used  . Alcohol Use: 0.0 oz/week    0 Standard drinks or equivalent per week     Comment: social   Current Outpatient Prescriptions  Medication Sig Dispense Refill  . aspirin EC 81 MG EC tablet Take 1 tablet (81 mg total) by mouth daily.    . colchicine 0.6 MG tablet Take 1 tablet (0.6 mg total) by mouth 2 (two) times daily. (Patient taking differently: Take 0.6 mg by mouth as needed. ) 30 tablet 3  . furosemide (LASIX) 40 MG tablet Take 1 tablet (40 mg total) by mouth daily. 90 tablet 0  . lisinopril (PRINIVIL,ZESTRIL) 5 MG tablet Take 1 tablet (5 mg total) by mouth daily. 30 tablet 6  . metoprolol succinate (TOPROL-XL) 50 MG 24 hr tablet Take 50 mg AM and 25 mg PM 30 tablet 6  .  sildenafil (VIAGRA) 100 MG tablet Take 1/2 tab 30 mins prior as needed 4 tablet 0  . warfarin (COUMADIN) 5 MG tablet Take 2 tabs (91m) daily by mouth except 1 1/2 tabs (7.519m on Monday, Wednesday, & Friday - instructions per coumadin clinic. 90 tablet 2  . Na Sulfate-K Sulfate-Mg Sulf SOLN Take kit as directed 2 Bottle 0   No current facility-administered medications for this visit.   Allergies  Allergen Reactions  . Ivp Dye [Iodinated Diagnostic Agents] Nausea And Vomiting    ORAL LIQUID   . Morphine And Related Other (See Comments)    Not in right state of mind   . Shellfish-Derived Products Other (See Comments)    exacerbates gout    Review of Systems: Positive for fatigue, muscle pain and cramps, shortness breath, sweating palms, swelling of feet and legs and  excessive thirst. All other systems reviewed and negative except where noted in HPI.   Physical Exam: BP 100/60 mmHg  Pulse 76  Ht 5' 6.25" (1.683 m)  Wt 296 lb 6 oz (134.435 kg)  BMI 47.46 kg/m2 Constitutional: Pleasant, obese, white male in no acute distress. HEENT: Normocephalic and atraumatic. Conjunctivae are normal. No scleral icterus. Neck supple.  Cardiovascular: Normal rate, regular rhythm.  Pulmonary/chest: Effort normal and breath sounds normal. No wheezing, rales or rhonchi. Abdominal: Soft, obese, oblique RLQ surgical scar.  Bowel sounds active throughout. There are no masses palpable. No hepatomegaly. Extremities: no edema Lymphadenopathy: No cervical adenopathy noted. Neurological: Alert and oriented to person place and time. Skin: Skin is warm and dry. No rashes noted. Psychiatric: Normal mood and affect. Behavior is normal.   ASSESSMENT AND PLAN:  [age]ear-old male with recurrent episodes of diverticulitis documented by CT scans. Last episode October 2015. Patient has been evaluated by surgery for an elective colon resection. He has never had a colonoscopy which surgery is requesting he have done prior to an elective resection. Patient is on Coumadin for mechanical AVR. The risks, benefits, and alternatives to colonoscopy with possible biopsy and possible polypectomy were discussed with the patient and he consents to proceed. See #2.   2. Mechanical AVR. For colonoscopy patient will hold  Coumadin 5 days before procedure - will instruct when and how to resume after procedure.. Will communicate by phone or EMR with patient's prescribing provider to confirm that holding coumadin is reasonable in this case and to see if Lovenox bridge is needed. Will let patient know.  3. Dysphagia. Patient reports difficulty swallowing liquids. No problems swallowing solids. Patient does not cough with liquids but feels they don't go down at times. Doubt oropharyngeal in nature. Stricture  seems unlikely. May have dysmotility.  Will obtain an esophagram with tablet for further evaluation.   4. Obesity, BMI 47  5. OSA, on CPAP.   . Marland KitchenCc: DoCoralie KeensMD  Addendum: Reviewed and agree with initial management. JaJerene BearsMD

## 2015-04-17 NOTE — Telephone Encounter (Signed)
Pt will need to hold coumadin 5 days before procedure and be bridged with Lovenox 150mg  2 x day due to mechanical aortic valve.  I have notified pt and this has been arranged.  He has an INR appt on 04/19/15 and lovenox bridging scheduled will be discussed at that time.

## 2015-04-17 NOTE — Telephone Encounter (Signed)
This patient has a mechanical aortic valve and will require bridging with lovenox to come off coumadin for his endoscopic procedure. Lattie Haw, can you please arrange.    Zandra Abts MD

## 2015-04-17 NOTE — Patient Instructions (Addendum)
You have been scheduled for a colonoscopy. Please follow written instructions given to you at your visit today.  Please pick up your prep supplies at the pharmacy within the next 1-3 days. If you use inhalers (even only as needed), please bring them with you on the day of your procedure.   You have been scheduled for a Barium Esophogram at [facility] (1st floor of the hospital) on 04-16-15 at 2:00pm. Please arrive 15 minutes prior to your appointment for registration. Make certain not to have anything to eat or drink 6 hours prior to your test. If you need to reschedule for any reason, please contact radiology at [phone] to do so. __________________________________________________________________ A barium swallow is an examination that concentrates on views of the esophagus. This tends to be a double contrast exam (barium and two liquids which, when combined, create a gas to distend the wall of the oesophagus) or single contrast (non-ionic iodine based). The study is usually tailored to your symptoms so a good history is essential. Attention is paid during the study to the form, structure and configuration of the esophagus, looking for functional disorders (such as aspiration, dysphagia, achalasia, motility and reflux) EXAMINATION You may be asked to change into a gown, depending on the type of swallow being performed. A radiologist and radiographer will perform the procedure. The radiologist will advise you of the type of contrast selected for your procedure and direct you during the exam. You will be asked to stand, sit or lie in several different positions and to hold a small amount of fluid in your mouth before being asked to swallow while the imaging is performed .In some instances you may be asked to swallow barium coated marshmallows to assess the motility of a solid food bolus. The exam can be recorded as a digital or video fluoroscopy procedure. POST PROCEDURE It will take 1-2  days for the barium to pass through your system. To facilitate this, it is important, unless otherwise directed, to increase your fluids for the next 24-48hrs and to resume your normal diet.  This test typically takes about 30 minutes to perform. __________________________________________________________________________________

## 2015-04-17 NOTE — Telephone Encounter (Signed)
  04/17/2015   RE: Alec Kelly DOB: December 19, 1965 MRN: [id number]   Dear Harl Bowie,    We have scheduled the above patient for an endoscopic procedure. Our records show that he is on anticoagulation therapy.   Please advise as to how long the patient may come off his therapy of coumadin prior to the procedure, which is scheduled for 04-24-15.  Please fax back/ or route the completed form to [place] at [phone].   Sincerely,    Gerlean Ren

## 2015-04-18 NOTE — Telephone Encounter (Signed)
Pt made aware to hold Coumadin 5 days prior and to bridge with Lovenox. Pt has appt with Dr. Harl Bowie on 5-5 16 and will discuss this info at that time. Pt to call back with any question or concerns.

## 2015-04-19 ENCOUNTER — Ambulatory Visit (INDEPENDENT_AMBULATORY_CARE_PROVIDER_SITE_OTHER): Payer: 59 | Admitting: *Deleted

## 2015-04-19 ENCOUNTER — Encounter: Payer: Self-pay | Admitting: Internal Medicine

## 2015-04-19 DIAGNOSIS — Z5181 Encounter for therapeutic drug level monitoring: Secondary | ICD-10-CM

## 2015-04-19 DIAGNOSIS — Z952 Presence of prosthetic heart valve: Secondary | ICD-10-CM

## 2015-04-19 DIAGNOSIS — I359 Nonrheumatic aortic valve disorder, unspecified: Secondary | ICD-10-CM | POA: Diagnosis not present

## 2015-04-19 DIAGNOSIS — Z954 Presence of other heart-valve replacement: Secondary | ICD-10-CM | POA: Diagnosis not present

## 2015-04-19 LAB — POCT INR: INR: 3

## 2015-04-19 NOTE — Patient Instructions (Signed)
5/4  Last dose of coumadin 5/5  No lovenox or coumadin 5/6  Lovenox 150mg  SQ BID @ 8am & 8pm 5/7   Lovenox 150mg  SQ BID @ 8am & 8pm 5/8   Lovenox 150mg  SQ BID @ 8am & 8pm 5/9   Lovenox 150mg  SQ am only 5/10  No lovenox in am---------procedure-------lovenox 150mg  SQ PM & coumadin 10mg  pm 5/11  lovenox 150mg  SQ BID & coumadin 10mg  pm 5/12  lovenox 150mg  SQ PM & coumadin 10mg  pm 5/13  lovenox 150mg  SQ PM & coumadin 10mg  pm 5/14  lovenox 150mg  SQ PM & coumadin 10mg  pm 5/15  lovenox 150mg  SQ PM & coumadin 10mg  pm 5/16  lovenox 150mg  SQ PM & coumadin 10mg  pm 5/17  lovenox 150mg  SQ am-----INR appt @ 4:10pm

## 2015-04-24 ENCOUNTER — Encounter: Payer: Self-pay | Admitting: Internal Medicine

## 2015-04-24 ENCOUNTER — Ambulatory Visit (AMBULATORY_SURGERY_CENTER): Payer: 59 | Admitting: Internal Medicine

## 2015-04-24 VITALS — BP 104/65 | HR 54 | Temp 97.5°F | Resp 18 | Ht 66.0 in | Wt 296.0 lb

## 2015-04-24 DIAGNOSIS — Z8719 Personal history of other diseases of the digestive system: Secondary | ICD-10-CM | POA: Diagnosis not present

## 2015-04-24 DIAGNOSIS — Z1211 Encounter for screening for malignant neoplasm of colon: Secondary | ICD-10-CM

## 2015-04-24 MED ORDER — SODIUM CHLORIDE 0.9 % IV SOLN: 500.0000 mL | INTRAVENOUS | Status: DC

## 2015-04-24 NOTE — Progress Notes (Signed)
Called to room to assist during endoscopic procedure.  Patient ID and intended procedure confirmed with present staff. Received instructions for my participation in the procedure from the performing physician.  

## 2015-04-24 NOTE — Patient Instructions (Addendum)
YOU HAD AN ENDOSCOPIC PROCEDURE TODAY AT [facility]:   Refer to the procedure report that was given to you for any specific questions about what was found during the examination.  If the procedure report does not answer your questions, please call your gastroenterologist to clarify.  If you requested that your care partner not be given the details of your procedure findings, then the procedure report has been included in a sealed envelope for you to review at your convenience later.  YOU SHOULD EXPECT: Some feelings of bloating in the abdomen. Passage of more gas than usual.  Walking can help get rid of the air that was put into your GI tract during the procedure and reduce the bloating. If you had a lower endoscopy (such as a colonoscopy or flexible sigmoidoscopy) you may notice spotting of blood in your stool or on the toilet paper. If you underwent a bowel prep for your procedure, you may not have a normal bowel movement for a few days.  Please Note:  You might notice some irritation and congestion in your nose or some drainage.  This is from the oxygen used during your procedure.  There is no need for concern and it should clear up in a day or so.  SYMPTOMS TO REPORT IMMEDIATELY:   Following lower endoscopy (colonoscopy or flexible sigmoidoscopy):  Excessive amounts of blood in the stool  Significant tenderness or worsening of abdominal pains  Swelling of the abdomen that is new, acute  Fever of 100F or higher   For urgent or emergent issues, a gastroenterologist can be reached at any hour by calling [phone].   DIET: Your first meal following the procedure should be a small meal and then it is ok to progress to your normal diet. Heavy or fried foods are harder to digest and may make you feel nauseous or bloated.  Likewise, meals heavy in dairy and vegetables can increase bloating.  Drink plenty of fluids but you should avoid alcoholic beverages for 24  hours.  ACTIVITY:  You should plan to take it easy for the rest of today and you should NOT DRIVE or use heavy machinery until tomorrow (because of the sedation medicines used during the test).    FOLLOW UP: Our staff will call the number listed on your records the next business day following your procedure to check on you and address any questions or concerns that you may have regarding the information given to you following your procedure. If we do not reach you, we will leave a message.  However, if you are feeling well and you are not experiencing any problems, there is no need to return our call.  We will assume that you have returned to your regular daily activities without incident.  If any biopsies were taken you will be contacted by phone or by letter within the next 1-3 weeks.  Please call us at [phone] if you have not heard about the biopsies in 3 weeks.    SIGNATURES/CONFIDENTIALITY: You and/or your care partner have signed paperwork which will be entered into your electronic medical record.  These signatures attest to the fact that that the information above on your After Visit Summary has been reviewed and is understood.  Full responsibility of the confidentiality of this discharge information lies with you and/or your care-partner.  Diverticulosis and high fiber diet information given.  Resume lovenox with this evenings dose.

## 2015-04-24 NOTE — Progress Notes (Signed)
Report to PACU, RN, vss, BBS= Clear.  

## 2015-04-24 NOTE — Op Note (Signed)
[place]  [address] [place], [id number]   COLONOSCOPY PROCEDURE REPORT  PATIENT: Alec Kelly, Alec Kelly  MR#: [id number] BIRTHDATE: 07-22-1966 , 48  yrs. old GENDER: male ENDOSCOPIST: Jerene Bears, MD REFERRED NF:[id number] Ninfa Linden, M.D. PROCEDURE DATE:  04/24/2015 PROCEDURE:   Colonoscopy with biopsy First Screening Colonoscopy - Avg.  risk and is 50 yrs.  old or older Yes.  Prior Negative Screening - Now for repeat screening. N/A  History of Adenoma - Now for follow-up colonoscopy & has been > or = to 3 yrs.  N/A ASA CLASS:   Class III INDICATIONS:colon cancer screening, history of complicated sigmoid diverticulitis. MEDICATIONS: Monitored anesthesia care, Propofol 300 mg IV, and Lidocaine 200 mg IV  DESCRIPTION OF PROCEDURE:   After the risks benefits and alternatives of the procedure were thoroughly explained, informed consent was obtained.  The digital rectal exam revealed no rectal mass.   The LB [id number]  endoscope was introduced through the anus and advanced to the cecum, which was identified by both the appendix and ileocecal valve. No adverse events experienced. The quality of the prep was good.  (Suprep was used)  The instrument was then slowly withdrawn as the colon was fully examined.  COLON FINDINGS: There was severe diverticulosis noted in the descending colon and sigmoid colon with associated muscular hypertrophy and inflammatory changes.  In the sigmoid colon in the area of diverticulosis there was an area of polypoid mucosa which likely is post-inflammatory in nature.  Multiple biopsies were obtained from this area.  There was mild diverticulosis in the cecum and ascending colon. The examination was otherwise normal. Retroflexed views revealed internal hemorrhoids. The time to cecum = 2.3 Withdrawal time = 11.2   The scope was withdrawn and the procedure completed.  COMPLICATIONS: There were no immediate complications.  ENDOSCOPIC  IMPRESSION: 1. There was severe diverticulosis noted in the descending colon and sigmoid colon; area of polypoid mucosa, likely postinflammatory in nature in the sigmoid; multiple biopsies 2.  Mild diverticulosis in the cecum and ascending colon 3.   The examination was otherwise normal  RECOMMENDATIONS: 1.  Await biopsy results 2.  High fiber diet 3.  Okay to resume Lovenox with this evenings dose 4.  You will receive a letter within 1-2 weeks with the results of your biopsy as well as final recommendations.  Please call my office if you have not received a letter after 3 weeks.  eSigned:  Jerene Bears, MD 2015-04-24 29:52:84.132   cc: Coralie Keens, MD and The Patient   PATIENT NAME:  Alec Kelly, Alec Kelly MR#: [id number]

## 2015-04-25 ENCOUNTER — Telehealth: Payer: Self-pay

## 2015-04-25 NOTE — Telephone Encounter (Signed)
  Follow up Call-  Call back number 04/24/2015  Post procedure Call Back phone  # [phone]  Permission to leave phone message Yes     Patient questions:  Do you have a fever, pain , or abdominal swelling? No. Pain Score  0 *  Have you tolerated food without any problems? Yes.    Have you been able to return to your normal activities? Yes.    Do you have any questions about your discharge instructions: Diet   No. Medications  No. Follow up visit  No.  Do you have questions or concerns about your Care? No.  Actions: * If pain score is 4 or above: No action needed, pain <4.

## 2015-04-27 ENCOUNTER — Other Ambulatory Visit: Payer: Self-pay | Admitting: Internal Medicine

## 2015-04-27 DIAGNOSIS — D125 Benign neoplasm of sigmoid colon: Secondary | ICD-10-CM

## 2015-05-01 ENCOUNTER — Ambulatory Visit (INDEPENDENT_AMBULATORY_CARE_PROVIDER_SITE_OTHER): Payer: 59 | Admitting: *Deleted

## 2015-05-01 ENCOUNTER — Other Ambulatory Visit: Payer: Self-pay | Admitting: Surgery

## 2015-05-01 DIAGNOSIS — Z954 Presence of other heart-valve replacement: Secondary | ICD-10-CM | POA: Diagnosis not present

## 2015-05-01 DIAGNOSIS — Z952 Presence of prosthetic heart valve: Secondary | ICD-10-CM

## 2015-05-01 DIAGNOSIS — I359 Nonrheumatic aortic valve disorder, unspecified: Secondary | ICD-10-CM

## 2015-05-01 DIAGNOSIS — Z5181 Encounter for therapeutic drug level monitoring: Secondary | ICD-10-CM

## 2015-05-01 LAB — POCT INR: INR: 1.5

## 2015-05-02 ENCOUNTER — Encounter: Payer: Self-pay | Admitting: Internal Medicine

## 2015-05-03 ENCOUNTER — Ambulatory Visit (INDEPENDENT_AMBULATORY_CARE_PROVIDER_SITE_OTHER): Payer: 59 | Admitting: *Deleted

## 2015-05-03 DIAGNOSIS — Z5181 Encounter for therapeutic drug level monitoring: Secondary | ICD-10-CM

## 2015-05-03 DIAGNOSIS — Z954 Presence of other heart-valve replacement: Secondary | ICD-10-CM

## 2015-05-03 DIAGNOSIS — Z952 Presence of prosthetic heart valve: Secondary | ICD-10-CM

## 2015-05-03 DIAGNOSIS — I359 Nonrheumatic aortic valve disorder, unspecified: Secondary | ICD-10-CM | POA: Diagnosis not present

## 2015-05-03 LAB — POCT INR: INR: 1.9

## 2015-05-10 ENCOUNTER — Telehealth: Payer: Self-pay | Admitting: Cardiology

## 2015-05-10 NOTE — Telephone Encounter (Signed)
PLS CALL DR. [place] CONCERNING Alec Kelly--HE HAS SURGERY AT THE END OF June AND THEY NEED TO BRIDGE HIS COUMADIN

## 2015-05-10 NOTE — Telephone Encounter (Signed)
Pt scheduled for surgery on 6/29 @ 8:30am.  Told Crystal I would arrange Lovenox Bridging.  Pt has an appt with me on 05/17/15.

## 2015-05-10 NOTE — Telephone Encounter (Signed)
Will route to [facility] Coumadin nurse

## 2015-05-24 ENCOUNTER — Ambulatory Visit (INDEPENDENT_AMBULATORY_CARE_PROVIDER_SITE_OTHER): Payer: 59 | Admitting: *Deleted

## 2015-05-24 DIAGNOSIS — Z954 Presence of other heart-valve replacement: Secondary | ICD-10-CM | POA: Diagnosis not present

## 2015-05-24 DIAGNOSIS — Z5181 Encounter for therapeutic drug level monitoring: Secondary | ICD-10-CM

## 2015-05-24 DIAGNOSIS — Z952 Presence of prosthetic heart valve: Secondary | ICD-10-CM

## 2015-05-24 DIAGNOSIS — I359 Nonrheumatic aortic valve disorder, unspecified: Secondary | ICD-10-CM | POA: Diagnosis not present

## 2015-05-24 LAB — POCT INR: INR: 3.1

## 2015-06-05 ENCOUNTER — Ambulatory Visit (INDEPENDENT_AMBULATORY_CARE_PROVIDER_SITE_OTHER): Payer: 59 | Admitting: *Deleted

## 2015-06-05 ENCOUNTER — Encounter (HOSPITAL_COMMUNITY): Payer: Self-pay

## 2015-06-05 ENCOUNTER — Other Ambulatory Visit: Payer: Self-pay

## 2015-06-05 ENCOUNTER — Encounter (HOSPITAL_COMMUNITY)
Admission: RE | Admit: 2015-06-05 | Discharge: 2015-06-05 | Disposition: A | Discharge status: Home / Self Care | Payer: 59 | Source: Ambulatory Visit | Attending: Surgery | Admitting: Surgery

## 2015-06-05 DIAGNOSIS — G4733 Obstructive sleep apnea (adult) (pediatric): Secondary | ICD-10-CM | POA: Diagnosis not present

## 2015-06-05 DIAGNOSIS — Z954 Presence of other heart-valve replacement: Secondary | ICD-10-CM

## 2015-06-05 DIAGNOSIS — Z79899 Other long term (current) drug therapy: Secondary | ICD-10-CM | POA: Diagnosis not present

## 2015-06-05 DIAGNOSIS — I429 Cardiomyopathy, unspecified: Secondary | ICD-10-CM | POA: Insufficient documentation

## 2015-06-05 DIAGNOSIS — Z7982 Long term (current) use of aspirin: Secondary | ICD-10-CM | POA: Insufficient documentation

## 2015-06-05 DIAGNOSIS — Z01818 Encounter for other preprocedural examination: Secondary | ICD-10-CM | POA: Insufficient documentation

## 2015-06-05 DIAGNOSIS — Z5181 Encounter for therapeutic drug level monitoring: Secondary | ICD-10-CM

## 2015-06-05 DIAGNOSIS — K219 Gastro-esophageal reflux disease without esophagitis: Secondary | ICD-10-CM | POA: Diagnosis not present

## 2015-06-05 DIAGNOSIS — Z01812 Encounter for preprocedural laboratory examination: Secondary | ICD-10-CM | POA: Diagnosis not present

## 2015-06-05 DIAGNOSIS — I491 Atrial premature depolarization: Secondary | ICD-10-CM | POA: Insufficient documentation

## 2015-06-05 DIAGNOSIS — K5792 Diverticulitis of intestine, part unspecified, without perforation or abscess without bleeding: Secondary | ICD-10-CM | POA: Insufficient documentation

## 2015-06-05 DIAGNOSIS — Z7901 Long term (current) use of anticoagulants: Secondary | ICD-10-CM | POA: Diagnosis not present

## 2015-06-05 DIAGNOSIS — I359 Nonrheumatic aortic valve disorder, unspecified: Secondary | ICD-10-CM

## 2015-06-05 DIAGNOSIS — Z952 Presence of prosthetic heart valve: Secondary | ICD-10-CM

## 2015-06-05 LAB — COMPREHENSIVE METABOLIC PANEL
ALBUMIN: 4 g/dL (ref 3.5–5.0)
ALT: 16 U/L — ABNORMAL LOW (ref 17–63)
AST: 23 U/L (ref 15–41)
Alkaline Phosphatase: 58 U/L (ref 38–126)
Anion gap: 6 (ref 5–15)
BUN: 13 mg/dL (ref 6–20)
CALCIUM: 8.8 mg/dL — AB (ref 8.9–10.3)
CHLORIDE: 107 mmol/L (ref 101–111)
CO2: 26 mmol/L (ref 22–32)
CREATININE: 1.19 mg/dL (ref 0.61–1.24)
GFR calc Af Amer: >60 mL/min (ref >60)
GFR calc non Af Amer: >60 mL/min (ref >60)
Glucose, Bld: 93 mg/dL (ref 65–99)
Potassium: 4.2 mmol/L (ref 3.5–5.1)
Sodium: 139 mmol/L (ref 135–145)
Total Bilirubin: 0.7 mg/dL (ref 0.3–1.2)
Total Protein: 7.2 g/dL (ref 6.5–8.1)

## 2015-06-05 LAB — CBC
HCT: 40.4 % (ref 39.0–52.0)
HEMOGLOBIN: 13.9 g/dL (ref 13.0–17.0)
MCH: 30.9 pg (ref 26.0–34.0)
MCHC: 34.4 g/dL (ref 30.0–36.0)
MCV: 89.8 fL (ref 78.0–100.0)
PLATELETS: 254 10*3/uL (ref 150–400)
RBC: 4.5 MIL/uL (ref 4.22–5.81)
RDW: 13.2 % (ref 11.5–15.5)
WBC: 7.8 10*3/uL (ref 4.0–10.5)

## 2015-06-05 LAB — POCT INR: INR: 3.2

## 2015-06-05 MED ORDER — ENOXAPARIN SODIUM 150 MG/ML ~~LOC~~ SOLN: 150.0000 mg | Freq: Two times a day (BID) | SUBCUTANEOUS | Status: AC

## 2015-06-05 NOTE — Patient Instructions (Signed)
6/23  Take last dose of coumadin 6/24  No lovenox or coumadin 6/25  Lovenox 150mg  sq @ 7am & 7pm 6/26  Lovenox 150mg  sq @ 7am & 7pm 6/27  Lovenox 150mg  sq @ 7am & 7pm 6/28  Lovenox 150mg  sq @ 7am ,  No lovenox pm 6/29  No lovenox ----Surgery @ 8:30 am

## 2015-06-05 NOTE — Pre-Procedure Instructions (Signed)
Alec Kelly  06/05/2015      [facility] [place], [place] [address] [place] [address] Phone: [phone] Fax: [phone]    Your procedure is scheduled on Wednesday, June 29th   Report to [facility] Admitting at 6:30 AM  Call this number if you have problems the morning of surgery:  [phone]   Remember:  Do not eat food or drink liquids after midnight Tuesday.  Take these medicines the morning of surgery with A SIP OF WATER : Metoprolol   Do not wear jewelry, no rings or watches.  Do not wear lotions, or colognes.   You may NOT wear deodorant the day of surgery.             Men may shave face and neck.   Do not bring valuables to the hospital.  [facility] is not responsible for any belongings or valuables.  Contacts, dentures or bridgework may not be worn into surgery.  Leave your suitcase in the car.  After surgery it may be brought to your room. For patients admitted to the hospital, discharge time will be determined by your treatment team.  Name and phone number of your driver:     Special instructions:  "Preparing for Surgery" instruction sheet.  Please read over the following fact sheets that you were given. Pain Booklet, Coughing and Deep Breathing and Surgical Site Infection Prevention

## 2015-06-05 NOTE — Progress Notes (Addendum)
Cardio is Dr. Harl Bowie [facility] facility).  LOV 3 mths ago.  He is aware of patients upcoming surgery. Sleep Apnea  Dr. Luan Pulling in [place].  Wears CPAP-settings at 15(he thinks).  Sleep Study in [place] procedure was done by Dr. Cyndia Bent 05/2014 Denies any cardiac issues at the present time.  Originally started out trying to do colonoscopy, found out heart wasn't working to well.  "did work up and the rest is history" He states that he was instructed by "Lattie Haw" coumadin nurse to stop the coumadin on the 23rd or 24th and start the lovenox injections on Friday July 1st. No verbage for permit in orders.  Have called [facility] office and left message.

## 2015-06-06 ENCOUNTER — Other Ambulatory Visit: Payer: Self-pay | Admitting: Surgery

## 2015-06-06 LAB — HEMOGLOBIN A1C
HEMOGLOBIN A1C: 5.5 % (ref 4.8–5.6)
MEAN PLASMA GLUCOSE: 111 mg/dL

## 2015-06-06 NOTE — Progress Notes (Signed)
Anesthesia Chart Review:  Pt is 49 year old male scheduled for laparoscopic partial colectomy on 06/08/2015 with Dr. Ninfa Linden.   Cardiologist is Dr. Harl Bowie, last office visit 03/09/15.  PMH includes: nonischemic cardiomyopathy, carotid stenosis, aortic arch aneurysm, aortic stenosis, GERD, OSA (on CPAP). Never smoker. BMI 46. S/p subxyphoid pericardial window 06/27/14. S/p Bentall procedure 06/02/14.   Medications include: ASA, lovenox, lasix, lisinpril, metoprolol, viagra, coumadin. Pt to stop coumadin 6/23 and start lovenox injections 6/25. Called and reviewed this scheduled with pt as he reported an alternate schedule to the PAT RN.   Preoperative labs reviewed.  Pt will need PT/INR DOS.   Chest x-ray 07/19/2014 reviewed. No acute cardiopulmonary disease. No cardiopericardial enlargement.   EKG 06/05/2015: Sinus rhythm with Fusion complexes and PACs. Nonspecific T wave abnormality. When compared with ECG of 06/03/14, narrow complexes are new per Dr. Percival Spanish.  Echo 02/16/2015: - Left ventricle: The cavity size was normal. There was mild concentric hypertrophy. Systolic function was normal. The estimated ejection fraction was in the range of 50% to 55%. Features are consistent with a pseudonormal left ventricular filling pattern, with concomitant abnormal relaxation and increased filling pressure (grade 2 diastolic dysfunction). Doppler parameters are consistent with high ventricular filling pressure. - Ventricular septum: Septal motion showed abnormal function and dyssynergy. These changes are consistent with a left bundle branch block. - Aortic valve: A normally functioning mechanical prosthesis was present. Peak velocity 2.73 m/s. Mean gradient (S): 17 mm Hg. - Mitral valve: There was trivial regurgitation. - Left atrium: The atrium was mildly dilated. Volume/bsa, ES, (1-plane Simpson&'s, A2C): 32.6 ml/m^2. - Right ventricle: Systolic function was mildly reduced. - Pericardium, extracardiac: Not  well visualized. No obvious effusion.  Carotid duplex US 05/31/2014: Findings suggest 1-39% internal carotid artery stenosis bilaterally. Vertebral arteries are patent with antegrade flow.  Cardiac cath 05/15/2014: 1. Normal coronary anatomy. 2. Severe LV dysfunction. 3. Severe aortic stenosis. 4. Proximal aortic root aneurysm. 5. Mild pulmonary HTN with elevated LV filling pressures.  If no changes, I anticipate pt can proceed with surgery as scheduled.   Willeen Cass, FNP-BC [facility]/Anesthesiology Phone: [phone] 06/06/2015 1:31 PM

## 2015-06-12 MED ORDER — METRONIDAZOLE IN NACL 5-0.79 MG/ML-% IV SOLN
500.0000 mg | INTRAVENOUS | Status: AC
  Administered 2015-06-13: 500 mg via INTRAVENOUS
  Filled 2015-06-12: qty 100

## 2015-06-12 MED ORDER — DEXTROSE 5 % IV SOLN
3.0000 g | INTRAVENOUS | Status: AC
  Administered 2015-06-13: 3 g via INTRAVENOUS
  Filled 2015-06-12: qty 3000

## 2015-06-12 MED ORDER — ALVIMOPAN 12 MG PO CAPS
12.0000 mg | ORAL_CAPSULE | Freq: Once | ORAL | Status: AC
  Administered 2015-06-13: 12 mg via ORAL
  Filled 2015-06-12: qty 1

## 2015-06-12 NOTE — H&P (Signed)
Alec Kelly is an 49 y.o. male.   Chief Complaint: recurrent diverticulitis HPI: This gentleman presents for elective partial colectomy.  He has had multiple bouts of sigmoid diverticulitis over the past 4 years.  He has even had focal perforation with need for a percutaneous drain to resolve one of the episodes.  He is on chronic anticoagulation meds.  He underwent a colonoscopy last month which was negative for malignancy.  He is currently without any abdominal pain.  Past Medical History  Diagnosis Date  . Diverticulosis   . Diverticulitis of sigmoid colon 04/07/2013    With perforation  . Obesity, Class III, BMI 40-49.9 (morbid obesity) 04/07/2013  . 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 Aortic arch aneurysm 2015  . Aortic stenosis 2015    s/p Bentall with mechanical (St Jude AVR) 05/2014  . Personal history of kidney stones   . GERD (gastroesophageal reflux disease)   . Claustrophobia     does not like anything put over his face  . NICM (nonischemic cardiomyopathy)     Echo (06/12/14):  Moderate LVH, EF 30-35%, mechanical AVR ok (mean 11 mm Hg), mild LAE, mildly reduced RVSF, mild to moderate RAE, moderate pericardial effusion  . Carotid stenosis     Carotid US (05/31/14):  Bilateral ICA 1-39%  . Hx of cardiac cath     a. LHC (05/15/14):  Ostial circumflex 20%, EF 25-30%  . Pericardial effusion     hemorrhagic after Bentall >>> s/p subxiphoid pericardial window 06/2014   . OSA on CPAP     on CPAP    Past Surgical History  Procedure Laterality Date  . Tonsillectomy    . Appendectomy      24-19 years old - Dr. Lennie Hummer  . Umbilical hernia repair  04/17/6143    umbilical  - At [facility], Dr. Lennie Hummer, Repair of umbilical hernia with mesh.  . Lithotripsy    . Bentall procedure N/A 06/02/2014    Procedure: BENTALL PROCEDURE: 35mm St Jude Valve Conduit ;  Surgeon: Gaye Pollack, MD;  Location: [facility];  Service: Open Heart Surgery;  Laterality: N/A;  . Ascending aortic root replacement  N/A 06/02/2014    Procedure: Aortic Arch Replacement with 53mm Hemasheild Graft;  Surgeon: Gaye Pollack, MD;  Location: [facility];  Service: Open Heart Surgery;  Laterality: N/A;  . Intraoperative transesophageal echocardiogram N/A 06/02/2014    Procedure: INTRAOPERATIVE TRANSESOPHAGEAL ECHOCARDIOGRAM;  Surgeon: Gaye Pollack, MD;  Location: [facility] OR;  Service: Open Heart Surgery;  Laterality: N/A;  . Subxyphoid pericardial window N/A 06/27/2014    Procedure: SUBXYPHOID PERICARDIAL WINDOW;  Surgeon: Gaye Pollack, MD;  Location: [facility];  Service: Thoracic;  Laterality: N/A;  . Intraoperative transesophageal echocardiogram N/A 06/27/2014    Procedure: INTRAOPERATIVE TRANSESOPHAGEAL ECHOCARDIOGRAM;  Surgeon: Gaye Pollack, MD;  Location: [facility] OR;  Service: Open Heart Surgery;  Laterality: N/A;  . Cardiac surgery    . Left and right heart catheterization with coronary angiogram N/A 05/15/2014    Procedure: LEFT AND RIGHT HEART CATHETERIZATION WITH CORONARY ANGIOGRAM;  Surgeon: Peter M Martinique, MD;  Location: [facility] CATH LAB;  Service: Cardiovascular;  Laterality: N/A;  . Coronary artery bypass graft      2015  . Cardiac catheterization      05/2014  . Hernia repair      umbilical hernia    Family History  Problem Relation Age of Onset  . Heart failure Mother   .  Diabetes Mother   . Hypertension Mother   . Hyperlipidemia Mother   . COPD Mother   . Prostate cancer Father   . Hyperlipidemia Father   . Heart disease Father   . Coronary artery disease Father   . Heart attack Neg Hx   . Diabetes Father   . Heart disease Mother   . Lung cancer Father    Social History:  reports that he has never smoked. He has never used smokeless tobacco. He reports that he drinks alcohol. He reports that he does not use illicit drugs.  Allergies:  Allergies  Allergen Reactions  . Ivp Dye [Iodinated Diagnostic Agents] Nausea And Vomiting    ORAL LIQUID   . Morphine And Related Other (See Comments)    Not in right  state of mind   . Shellfish-Derived Products Other (See Comments)    exacerbates gout    No prescriptions prior to admission    No results found for this or any previous visit (from the past 69 hour(s)). No results found.  Review of Systems  All other systems reviewed and are negative.   There were no vitals taken for this visit. Physical Exam  Constitutional: He is oriented to person, place, and time. He appears well-developed and well-nourished. No distress.  obese  HENT:  Head: Normocephalic and atraumatic.  Right Ear: External ear normal.  Left Ear: External ear normal.  Mouth/Throat: No oropharyngeal exudate.  Eyes: Conjunctivae are normal. No scleral icterus.  Neck: Normal range of motion. No tracheal deviation present.  Cardiovascular: Normal rate, regular rhythm and intact distal pulses.   Murmur heard. Respiratory: Effort normal and breath sounds normal. He has no wheezes.  GI: Soft. Bowel sounds are normal. He exhibits no distension. There is no tenderness.  Musculoskeletal: Normal range of motion. He exhibits no edema or tenderness.  Lymphadenopathy:    He has no cervical adenopathy.  Neurological: He is alert and oriented to person, place, and time.  Skin: Skin is warm and dry. No rash noted. He is not diaphoretic. No erythema.  Psychiatric: His behavior is normal. Judgment normal.     Assessment/Plan Recurrent diverticulitis  A laparoscopic partial colectomy has been recommended because of his multiple episodes as well as his need for anticoagulation medications.  I discussed the risks in detail.  These include but are not limited to bleeding, infection, injury to surrounding structures, cardiopulmonary issues, DVT, need for an ostomy, need for further surgery, post op recovery, etc.  He agrees to proceed.  Amyria Komar A 06/12/2015, 10:06 PM

## 2015-06-13 ENCOUNTER — Inpatient Hospital Stay (HOSPITAL_COMMUNITY): Payer: 59 | Admitting: Emergency Medicine

## 2015-06-13 ENCOUNTER — Encounter (HOSPITAL_COMMUNITY): Payer: Self-pay | Admitting: *Deleted

## 2015-06-13 ENCOUNTER — Inpatient Hospital Stay (HOSPITAL_COMMUNITY)
Admission: RE | Admit: 2015-06-13 | Discharge: 2015-07-16 | DRG: 330 | Disposition: E | Payer: 59 | Source: Ambulatory Visit | Attending: Surgery | Admitting: Surgery

## 2015-06-13 ENCOUNTER — Inpatient Hospital Stay (HOSPITAL_COMMUNITY): Payer: 59 | Admitting: Anesthesiology

## 2015-06-13 ENCOUNTER — Encounter (HOSPITAL_COMMUNITY): Admission: RE | Disposition: E | Payer: 59 | Source: Ambulatory Visit | Attending: Surgery

## 2015-06-13 DIAGNOSIS — Z91041 Radiographic dye allergy status: Secondary | ICD-10-CM | POA: Diagnosis not present

## 2015-06-13 DIAGNOSIS — Z6841 Body Mass Index (BMI) 40.0 and over, adult: Secondary | ICD-10-CM

## 2015-06-13 DIAGNOSIS — K219 Gastro-esophageal reflux disease without esophagitis: Secondary | ICD-10-CM | POA: Diagnosis present

## 2015-06-13 DIAGNOSIS — Z952 Presence of prosthetic heart valve: Secondary | ICD-10-CM

## 2015-06-13 DIAGNOSIS — I469 Cardiac arrest, cause unspecified: Secondary | ICD-10-CM | POA: Diagnosis not present

## 2015-06-13 DIAGNOSIS — Z8249 Family history of ischemic heart disease and other diseases of the circulatory system: Secondary | ICD-10-CM

## 2015-06-13 DIAGNOSIS — Z9049 Acquired absence of other specified parts of digestive tract: Secondary | ICD-10-CM

## 2015-06-13 DIAGNOSIS — Z885 Allergy status to narcotic agent status: Secondary | ICD-10-CM

## 2015-06-13 DIAGNOSIS — Z951 Presence of aortocoronary bypass graft: Secondary | ICD-10-CM | POA: Diagnosis not present

## 2015-06-13 DIAGNOSIS — G4733 Obstructive sleep apnea (adult) (pediatric): Secondary | ICD-10-CM | POA: Diagnosis present

## 2015-06-13 DIAGNOSIS — F40240 Claustrophobia: Secondary | ICD-10-CM | POA: Diagnosis present

## 2015-06-13 DIAGNOSIS — R0902 Hypoxemia: Secondary | ICD-10-CM | POA: Diagnosis not present

## 2015-06-13 DIAGNOSIS — K5732 Diverticulitis of large intestine without perforation or abscess without bleeding: Secondary | ICD-10-CM | POA: Diagnosis present

## 2015-06-13 DIAGNOSIS — M109 Gout, unspecified: Secondary | ICD-10-CM | POA: Diagnosis present

## 2015-06-13 DIAGNOSIS — D62 Acute posthemorrhagic anemia: Secondary | ICD-10-CM | POA: Diagnosis not present

## 2015-06-13 DIAGNOSIS — D72829 Elevated white blood cell count, unspecified: Secondary | ICD-10-CM | POA: Diagnosis not present

## 2015-06-13 DIAGNOSIS — I429 Cardiomyopathy, unspecified: Secondary | ICD-10-CM | POA: Diagnosis present

## 2015-06-13 DIAGNOSIS — Z91013 Allergy to seafood: Secondary | ICD-10-CM

## 2015-06-13 DIAGNOSIS — Z7901 Long term (current) use of anticoagulants: Secondary | ICD-10-CM | POA: Diagnosis not present

## 2015-06-13 DIAGNOSIS — R000 Tachycardia, unspecified: Secondary | ICD-10-CM | POA: Diagnosis not present

## 2015-06-13 DIAGNOSIS — R001 Bradycardia, unspecified: Secondary | ICD-10-CM | POA: Diagnosis not present

## 2015-06-13 DIAGNOSIS — K5792 Diverticulitis of intestine, part unspecified, without perforation or abscess without bleeding: Secondary | ICD-10-CM | POA: Diagnosis present

## 2015-06-13 HISTORY — PX: LAPAROSCOPIC PARTIAL COLECTOMY: SHX5907

## 2015-06-13 HISTORY — PX: PARTIAL COLECTOMY: SHX5273

## 2015-06-13 LAB — PROTIME-INR
INR: 1.28 (ref 0.00–1.49)
PROTHROMBIN TIME: 16.1 s — AB (ref 11.6–15.2)

## 2015-06-13 SURGERY — LAPAROSCOPIC PARTIAL COLECTOMY
Anesthesia: General | Site: Abdomen

## 2015-06-13 MED ORDER — ONDANSETRON HCL 4 MG/2ML IJ SOLN
4.0000 mg | Freq: Four times a day (QID) | INTRAMUSCULAR | Status: DC | PRN
  Filled 2015-06-13 (×2): qty 2

## 2015-06-13 MED ORDER — PHENYLEPHRINE HCL 10 MG/ML IJ SOLN
INTRAMUSCULAR | Status: AC
  Filled 2015-06-13: qty 1

## 2015-06-13 MED ORDER — CHLORHEXIDINE GLUCONATE 4 % EX LIQD: 60.0000 mL | Freq: Once | CUTANEOUS | Status: DC

## 2015-06-13 MED ORDER — ARTIFICIAL TEARS OP OINT
TOPICAL_OINTMENT | OPHTHALMIC | Status: AC
  Filled 2015-06-13: qty 3.5

## 2015-06-13 MED ORDER — OXYCODONE HCL 5 MG PO TABS: 5.0000 mg | ORAL_TABLET | Freq: Once | ORAL | Status: DC | PRN

## 2015-06-13 MED ORDER — PHENYLEPHRINE HCL 10 MG/ML IJ SOLN
INTRAMUSCULAR | Status: AC
Start: 2015-06-13 — End: 2015-06-13
  Filled 2015-06-13: qty 2

## 2015-06-13 MED ORDER — SUCCINYLCHOLINE CHLORIDE 20 MG/ML IJ SOLN
INTRAMUSCULAR | Status: AC
  Filled 2015-06-13: qty 1

## 2015-06-13 MED ORDER — HYDROMORPHONE 0.3 MG/ML IV SOLN
INTRAVENOUS | Status: AC
  Administered 2015-06-13: 12:00:00
  Filled 2015-06-13: qty 25

## 2015-06-13 MED ORDER — MIDAZOLAM HCL 5 MG/5ML IJ SOLN
INTRAMUSCULAR | Status: DC | PRN
  Administered 2015-06-13 (×2): 1 mg via INTRAVENOUS

## 2015-06-13 MED ORDER — GLYCOPYRROLATE 0.2 MG/ML IJ SOLN
INTRAMUSCULAR | Status: AC
  Filled 2015-06-13: qty 4

## 2015-06-13 MED ORDER — PHENYLEPHRINE 40 MCG/ML (10ML) SYRINGE FOR IV PUSH (FOR BLOOD PRESSURE SUPPORT)
PREFILLED_SYRINGE | INTRAVENOUS | Status: AC
  Filled 2015-06-13: qty 10

## 2015-06-13 MED ORDER — LACTATED RINGERS IV SOLN
INTRAVENOUS | Status: DC | PRN
  Administered 2015-06-13 (×3): via INTRAVENOUS

## 2015-06-13 MED ORDER — OXYCODONE HCL 5 MG/5ML PO SOLN: 5.0000 mg | Freq: Once | ORAL | Status: DC | PRN

## 2015-06-13 MED ORDER — ALBUMIN HUMAN 5 % IV SOLN
INTRAVENOUS | Status: DC | PRN
  Administered 2015-06-13 (×2): via INTRAVENOUS

## 2015-06-13 MED ORDER — MIDAZOLAM HCL 2 MG/2ML IJ SOLN
INTRAMUSCULAR | Status: AC
  Filled 2015-06-13: qty 2

## 2015-06-13 MED ORDER — PHENYLEPHRINE HCL 10 MG/ML IJ SOLN: INTRAMUSCULAR | Status: DC | PRN

## 2015-06-13 MED ORDER — HYDROMORPHONE HCL 1 MG/ML IJ SOLN
INTRAMUSCULAR | Status: AC
  Filled 2015-06-13: qty 1

## 2015-06-13 MED ORDER — ONDANSETRON HCL 4 MG/2ML IJ SOLN
INTRAMUSCULAR | Status: AC
  Filled 2015-06-13: qty 2

## 2015-06-13 MED ORDER — ONDANSETRON HCL 4 MG/2ML IJ SOLN
4.0000 mg | Freq: Four times a day (QID) | INTRAMUSCULAR | Status: DC | PRN
  Administered 2015-06-14 – 2015-06-15 (×2): 4 mg via INTRAVENOUS

## 2015-06-13 MED ORDER — METOPROLOL SUCCINATE ER 50 MG PO TB24
50.0000 mg | ORAL_TABLET | Freq: Once | ORAL | Status: AC
  Administered 2015-06-13: 50 mg via ORAL
  Filled 2015-06-13: qty 1

## 2015-06-13 MED ORDER — BUPIVACAINE-EPINEPHRINE 0.25% -1:200000 IJ SOLN
INTRAMUSCULAR | Status: DC | PRN
  Administered 2015-06-13: 30 mL

## 2015-06-13 MED ORDER — HYDROMORPHONE HCL 1 MG/ML IJ SOLN
0.2500 mg | INTRAMUSCULAR | Status: DC | PRN
  Administered 2015-06-13 (×2): 0.5 mg via INTRAVENOUS

## 2015-06-13 MED ORDER — ROCURONIUM BROMIDE 100 MG/10ML IV SOLN
INTRAVENOUS | Status: DC | PRN
  Administered 2015-06-13: 15 mg via INTRAVENOUS
  Administered 2015-06-13: 50 mg via INTRAVENOUS

## 2015-06-13 MED ORDER — ONDANSETRON HCL 4 MG/2ML IJ SOLN
INTRAMUSCULAR | Status: DC | PRN
  Administered 2015-06-13: 4 mg via INTRAVENOUS

## 2015-06-13 MED ORDER — SODIUM CHLORIDE 0.9 % IR SOLN
Status: DC | PRN
  Administered 2015-06-13 (×6): 1000 mL

## 2015-06-13 MED ORDER — LIDOCAINE HCL (CARDIAC) 20 MG/ML IV SOLN
INTRAVENOUS | Status: DC | PRN
  Administered 2015-06-13 (×2): 50 mg via INTRAVENOUS

## 2015-06-13 MED ORDER — NALOXONE HCL 0.4 MG/ML IJ SOLN: 0.4000 mg | INTRAMUSCULAR | Status: DC | PRN

## 2015-06-13 MED ORDER — LISINOPRIL 5 MG PO TABS
5.0000 mg | ORAL_TABLET | Freq: Every day | ORAL | Status: DC
  Administered 2015-06-13 – 2015-06-15 (×3): 5 mg via ORAL
  Filled 2015-06-13 (×4): qty 1

## 2015-06-13 MED ORDER — PROPOFOL 10 MG/ML IV BOLUS
INTRAVENOUS | Status: AC
  Filled 2015-06-13: qty 20

## 2015-06-13 MED ORDER — ARTIFICIAL TEARS OP OINT
TOPICAL_OINTMENT | OPHTHALMIC | Status: DC | PRN
  Administered 2015-06-13: 1 via OPHTHALMIC

## 2015-06-13 MED ORDER — LIDOCAINE HCL (CARDIAC) 20 MG/ML IV SOLN
INTRAVENOUS | Status: AC
  Filled 2015-06-13: qty 5

## 2015-06-13 MED ORDER — NEOSTIGMINE METHYLSULFATE 10 MG/10ML IV SOLN
INTRAVENOUS | Status: AC
  Filled 2015-06-13: qty 1

## 2015-06-13 MED ORDER — SODIUM CHLORIDE 0.9 % IJ SOLN
INTRAMUSCULAR | Status: AC
  Filled 2015-06-13: qty 10

## 2015-06-13 MED ORDER — ALVIMOPAN 12 MG PO CAPS
12.0000 mg | ORAL_CAPSULE | Freq: Two times a day (BID) | ORAL | Status: DC
  Administered 2015-06-14 – 2015-06-16 (×6): 12 mg via ORAL
  Filled 2015-06-13 (×9): qty 1

## 2015-06-13 MED ORDER — ATROPINE SULFATE 1 MG/ML IJ SOLN
INTRAMUSCULAR | Status: DC | PRN
  Administered 2015-06-13: 0.2 mg via INTRAVENOUS

## 2015-06-13 MED ORDER — ROCURONIUM BROMIDE 50 MG/5ML IV SOLN
INTRAVENOUS | Status: AC
  Filled 2015-06-13: qty 1

## 2015-06-13 MED ORDER — HYDROMORPHONE 0.3 MG/ML IV SOLN
INTRAVENOUS | Status: DC
  Administered 2015-06-13: 1.8 mg via INTRAVENOUS
  Administered 2015-06-13: 3.3 mg via INTRAVENOUS
  Administered 2015-06-13: 1.2 mg via INTRAVENOUS
  Administered 2015-06-13 – 2015-06-14 (×2): 0.9 mg via INTRAVENOUS
  Administered 2015-06-14: 22:00:00 via INTRAVENOUS
  Administered 2015-06-14: 1.2 mg via INTRAVENOUS
  Administered 2015-06-14: 2.7 mg via INTRAVENOUS
  Administered 2015-06-15: 0.3 mg via INTRAVENOUS
  Administered 2015-06-15: 1.11 mg via INTRAVENOUS
  Administered 2015-06-15: 1.8 mg via INTRAVENOUS
  Administered 2015-06-15 (×2): 1.2 mg via INTRAVENOUS
  Administered 2015-06-15: 1.5 mg via INTRAVENOUS
  Administered 2015-06-15: 1.2 mg via INTRAVENOUS
  Administered 2015-06-16: 0.9 mg via INTRAVENOUS
  Administered 2015-06-16: 1.5 mg via INTRAVENOUS
  Filled 2015-06-13 (×3): qty 25

## 2015-06-13 MED ORDER — GLYCOPYRROLATE 0.2 MG/ML IJ SOLN
INTRAMUSCULAR | Status: DC | PRN
  Administered 2015-06-13: 0.2 mg via INTRAVENOUS
  Administered 2015-06-13: 0.6 mg via INTRAVENOUS

## 2015-06-13 MED ORDER — FENTANYL CITRATE (PF) 100 MCG/2ML IJ SOLN
INTRAMUSCULAR | Status: DC | PRN
  Administered 2015-06-13: 100 ug via INTRAVENOUS
  Administered 2015-06-13 (×3): 50 ug via INTRAVENOUS

## 2015-06-13 MED ORDER — FENTANYL CITRATE (PF) 250 MCG/5ML IJ SOLN
INTRAMUSCULAR | Status: AC
  Filled 2015-06-13: qty 5

## 2015-06-13 MED ORDER — POTASSIUM CHLORIDE IN NACL 20-0.9 MEQ/L-% IV SOLN
INTRAVENOUS | Status: DC
  Administered 2015-06-13 – 2015-06-17 (×8): via INTRAVENOUS
  Filled 2015-06-13 (×12): qty 1000

## 2015-06-13 MED ORDER — PHENYLEPHRINE HCL 10 MG/ML IJ SOLN
INTRAMUSCULAR | Status: DC | PRN
  Administered 2015-06-13: 80 ug via INTRAVENOUS

## 2015-06-13 MED ORDER — SODIUM CHLORIDE 0.9 % IJ SOLN: 9.0000 mL | INTRAMUSCULAR | Status: DC | PRN

## 2015-06-13 MED ORDER — SODIUM CHLORIDE 0.9 % IV SOLN
10.0000 mg | INTRAVENOUS | Status: DC | PRN
  Administered 2015-06-13: 10 ug/min via INTRAVENOUS

## 2015-06-13 MED ORDER — BUPIVACAINE-EPINEPHRINE (PF) 0.25% -1:200000 IJ SOLN
INTRAMUSCULAR | Status: AC
  Filled 2015-06-13: qty 30

## 2015-06-13 MED ORDER — SUCCINYLCHOLINE CHLORIDE 20 MG/ML IJ SOLN
INTRAMUSCULAR | Status: DC | PRN
  Administered 2015-06-13: 120 mg via INTRAVENOUS

## 2015-06-13 MED ORDER — METOPROLOL SUCCINATE ER 50 MG PO TB24
50.0000 mg | ORAL_TABLET | Freq: Every day | ORAL | Status: DC
  Administered 2015-06-13 – 2015-06-15 (×3): 50 mg via ORAL
  Filled 2015-06-13 (×4): qty 1

## 2015-06-13 MED ORDER — ATROPINE SULFATE 0.1 MG/ML IJ SOLN
INTRAMUSCULAR | Status: AC
  Filled 2015-06-13: qty 10

## 2015-06-13 MED ORDER — NEOSTIGMINE METHYLSULFATE 10 MG/10ML IV SOLN
INTRAVENOUS | Status: DC | PRN
  Administered 2015-06-13: 4 mg via INTRAVENOUS

## 2015-06-13 MED ORDER — CHLORHEXIDINE GLUCONATE 0.12 % MT SOLN
15.0000 mL | Freq: Two times a day (BID) | OROMUCOSAL | Status: DC
  Administered 2015-06-13 – 2015-06-15 (×4): 15 mL via OROMUCOSAL
  Filled 2015-06-13 (×5): qty 15

## 2015-06-13 MED ORDER — ONDANSETRON HCL 4 MG PO TABS: 4.0000 mg | ORAL_TABLET | Freq: Four times a day (QID) | ORAL | Status: DC | PRN

## 2015-06-13 MED ORDER — EPHEDRINE SULFATE 50 MG/ML IJ SOLN
INTRAMUSCULAR | Status: AC
  Filled 2015-06-13: qty 1

## 2015-06-13 MED ORDER — CETYLPYRIDINIUM CHLORIDE 0.05 % MT LIQD
7.0000 mL | Freq: Two times a day (BID) | OROMUCOSAL | Status: DC
  Administered 2015-06-13 – 2015-06-15 (×4): 7 mL via OROMUCOSAL

## 2015-06-13 MED ORDER — DIPHENHYDRAMINE HCL 12.5 MG/5ML PO ELIX: 12.5000 mg | ORAL_SOLUTION | Freq: Four times a day (QID) | ORAL | Status: DC | PRN

## 2015-06-13 MED ORDER — PROPOFOL 10 MG/ML IV BOLUS
INTRAVENOUS | Status: DC | PRN
  Administered 2015-06-13: 20 mg via INTRAVENOUS
  Administered 2015-06-13: 200 mg via INTRAVENOUS

## 2015-06-13 MED ORDER — DIPHENHYDRAMINE HCL 50 MG/ML IJ SOLN: 12.5000 mg | Freq: Four times a day (QID) | INTRAMUSCULAR | Status: DC | PRN

## 2015-06-13 SURGICAL SUPPLY — 77 items
APPLIER CLIP ROT 10 11.4 M/L (STAPLE)
BLADE SURG 10 STRL SS (BLADE) ×3 IMPLANT
BLADE SURG ROTATE 9660 (MISCELLANEOUS) ×3 IMPLANT
CANISTER SUCTION 2500CC (MISCELLANEOUS) ×3 IMPLANT
CELLS DAT CNTRL 66122 CELL SVR (MISCELLANEOUS) ×1 IMPLANT
CHLORAPREP W/TINT 26ML (MISCELLANEOUS) ×3 IMPLANT
CLIP APPLIE ROT 10 11.4 M/L (STAPLE) IMPLANT
COVER MAYO STAND STRL (DRAPES) ×3 IMPLANT
COVER SURGICAL LIGHT HANDLE (MISCELLANEOUS) ×6 IMPLANT
DRAPE PROXIMA HALF (DRAPES) IMPLANT
DRAPE UTILITY XL STRL (DRAPES) ×6 IMPLANT
DRAPE WARM FLUID 44X44 (DRAPE) ×3 IMPLANT
DRSG OPSITE POSTOP 4X10 (GAUZE/BANDAGES/DRESSINGS) IMPLANT
DRSG OPSITE POSTOP 4X8 (GAUZE/BANDAGES/DRESSINGS) ×3 IMPLANT
ELECT BLADE 6.5 EXT (BLADE) ×3 IMPLANT
ELECT CAUTERY BLADE 6.4 (BLADE) ×6 IMPLANT
ELECT REM PT RETURN 9FT ADLT (ELECTROSURGICAL) ×3
ELECTRODE REM PT RTRN 9FT ADLT (ELECTROSURGICAL) ×1 IMPLANT
GEL ULTRASOUND 20GR AQUASONIC (MISCELLANEOUS) ×3 IMPLANT
GLOVE BIO SURGEON STRL SZ 6 (GLOVE) ×6 IMPLANT
GLOVE BIO SURGEON STRL SZ 6.5 (GLOVE) ×2 IMPLANT
GLOVE BIO SURGEONS STRL SZ 6.5 (GLOVE) ×1
GLOVE BIOGEL PI IND STRL 6.5 (GLOVE) ×4 IMPLANT
GLOVE BIOGEL PI IND STRL 7.0 (GLOVE) ×3 IMPLANT
GLOVE BIOGEL PI IND STRL 8 (GLOVE) ×2 IMPLANT
GLOVE BIOGEL PI INDICATOR 6.5 (GLOVE) ×8
GLOVE BIOGEL PI INDICATOR 7.0 (GLOVE) ×6
GLOVE BIOGEL PI INDICATOR 8 (GLOVE) ×4
GLOVE SURG SIGNA 7.5 PF LTX (GLOVE) ×6 IMPLANT
GLOVE SURG SS PI 7.0 STRL IVOR (GLOVE) ×6 IMPLANT
GLOVE SURG SS PI 7.5 STRL IVOR (GLOVE) ×6 IMPLANT
GOWN STRL REUS W/ TWL LRG LVL3 (GOWN DISPOSABLE) ×4 IMPLANT
GOWN STRL REUS W/ TWL XL LVL3 (GOWN DISPOSABLE) ×3 IMPLANT
GOWN STRL REUS W/TWL LRG LVL3 (GOWN DISPOSABLE) ×8
GOWN STRL REUS W/TWL XL LVL3 (GOWN DISPOSABLE) ×6
KIT BASIN OR (CUSTOM PROCEDURE TRAY) ×3 IMPLANT
LEGGING LITHOTOMY PAIR STRL (DRAPES) ×3 IMPLANT
LIGASURE IMPACT 36 18CM CVD LR (INSTRUMENTS) ×3 IMPLANT
NS IRRIG 1000ML POUR BTL (IV SOLUTION) ×18 IMPLANT
PAD ARMBOARD 7.5X6 YLW CONV (MISCELLANEOUS) ×6 IMPLANT
PENCIL BUTTON HOLSTER BLD 10FT (ELECTRODE) ×6 IMPLANT
RELOAD PROXIMATE 75MM BLUE (ENDOMECHANICALS) ×3 IMPLANT
RTRCTR WOUND ALEXIS 18CM MED (MISCELLANEOUS) ×3
SCALPEL HARMONIC ACE (MISCELLANEOUS) ×3 IMPLANT
SCISSORS LAP 5X35 DISP (ENDOMECHANICALS) ×3 IMPLANT
SET IRRIG TUBING LAPAROSCOPIC (IRRIGATION / IRRIGATOR) IMPLANT
SLEEVE ENDOPATH XCEL 5M (ENDOMECHANICALS) ×3 IMPLANT
SPECIMEN JAR LARGE (MISCELLANEOUS) ×3 IMPLANT
SPONGE LAP 18X18 X RAY DECT (DISPOSABLE) ×15 IMPLANT
STAPLER CIRC ILS CVD 33MM 37CM (STAPLE) ×3 IMPLANT
STAPLER PROXIMATE 75MM BLUE (STAPLE) ×3 IMPLANT
STAPLER VISISTAT 35W (STAPLE) ×3 IMPLANT
SUCTION POOLE TIP (SUCTIONS) ×3 IMPLANT
SURGILUBE 2OZ TUBE FLIPTOP (MISCELLANEOUS) ×3 IMPLANT
SUT PDS AB 1 TP1 96 (SUTURE) ×6 IMPLANT
SUT PROLENE 2 0 CT2 30 (SUTURE) ×6 IMPLANT
SUT PROLENE 2 0 KS (SUTURE) IMPLANT
SUT SILK 2 0 SH CR/8 (SUTURE) ×6 IMPLANT
SUT SILK 2 0 TIES 10X30 (SUTURE) ×3 IMPLANT
SUT SILK 3 0 SH CR/8 (SUTURE) ×3 IMPLANT
SUT SILK 3 0 TIES 10X30 (SUTURE) ×3 IMPLANT
SYR BULB IRRIGATION 50ML (SYRINGE) ×3 IMPLANT
SYS LAPSCP GELPORT 120MM (MISCELLANEOUS)
SYSTEM LAPSCP GELPORT 120MM (MISCELLANEOUS) IMPLANT
TOWEL OR 17X26 10 PK STRL BLUE (TOWEL DISPOSABLE) ×6 IMPLANT
TRAY FOLEY CATH 16FRSI W/METER (SET/KITS/TRAYS/PACK) ×3 IMPLANT
TRAY LAPAROSCOPIC (CUSTOM PROCEDURE TRAY) ×3 IMPLANT
TRAY PROCTOSCOPIC FIBER OPTIC (SET/KITS/TRAYS/PACK) ×3 IMPLANT
TROCAR XCEL 12X100 BLDLESS (ENDOMECHANICALS) IMPLANT
TROCAR XCEL BLUNT TIP 100MML (ENDOMECHANICALS) IMPLANT
TROCAR XCEL NON-BLD 11X100MML (ENDOMECHANICALS) IMPLANT
TROCAR XCEL NON-BLD 5MMX100MML (ENDOMECHANICALS) ×3 IMPLANT
TUBE CONNECTING 12'X1/4 (SUCTIONS) ×2
TUBE CONNECTING 12X1/4 (SUCTIONS) ×4 IMPLANT
TUBING FILTER THERMOFLATOR (ELECTROSURGICAL) ×3 IMPLANT
TUBING INSUFFLATION (TUBING) ×3 IMPLANT
YANKAUER SUCT BULB TIP NO VENT (SUCTIONS) ×6 IMPLANT

## 2015-06-13 NOTE — Transfer of Care (Signed)
Immediate Anesthesia Transfer of Care Note  Patient: Alec Kelly  Procedure(s) Performed: Procedure(s): LAPAROSCOPIC ASSISTED PARTIAL COLECTOMY; RIGID PROCTOSCOPY (N/A)  Patient Location: PACU  Anesthesia Type:General  Level of Consciousness: awake, alert , oriented and sedated  Airway & Oxygen Therapy: Patient Spontanous Breathing and Patient connected to nasal cannula oxygen  Post-op Assessment: Report given to RN, Post -op Vital signs reviewed and stable and Patient moving all extremities  Post vital signs: Reviewed and stable  Last Vitals:  Filed Vitals:   06/12/2015 0633  BP: 131/73  Pulse: 78  Temp: 37 C  Resp: 20    Complications: No apparent anesthesia complications

## 2015-06-13 NOTE — Anesthesia Preprocedure Evaluation (Signed)
Anesthesia Evaluation  Patient identified by MRN, date of birth, ID band Patient awake    Reviewed: Allergy & Precautions, NPO status , Patient's Chart, lab work & pertinent test results, reviewed documented beta blocker date and time   History of Anesthesia Complications Negative for: history of anesthetic complications  Airway Mallampati: II  TM Distance: >3 FB Neck ROM: Full    Dental  (+) Teeth Intact   Pulmonary sleep apnea ,  breath sounds clear to auscultation        Cardiovascular hypertension, + Peripheral Vascular Disease and +CHF + Valvular Problems/Murmurs Rhythm:Regular + Systolic Click    Neuro/Psych PSYCHIATRIC DISORDERS Anxiety negative neurological ROS     GI/Hepatic negative GI ROS, Neg liver ROS,   Endo/Other  Morbid obesity  Renal/GU negative Renal ROS     Musculoskeletal   Abdominal   Peds  Hematology negative hematology ROS (+)   Anesthesia Other Findings   Reproductive/Obstetrics                             Anesthesia Physical Anesthesia Plan  ASA: III  Anesthesia Plan: General   Post-op Pain Management:    Induction: Intravenous  Airway Management Planned: Oral ETT  Additional Equipment: None  Intra-op Plan:   Post-operative Plan: Extubation in OR  Informed Consent: I have reviewed the patients History and Physical, chart, labs and discussed the procedure including the risks, benefits and alternatives for the proposed anesthesia with the patient or authorized representative who has indicated his/her understanding and acceptance.   Dental advisory given  Plan Discussed with: CRNA and Surgeon  Anesthesia Plan Comments:         Anesthesia Quick Evaluation

## 2015-06-13 NOTE — Progress Notes (Signed)
Patient wears nasal pillows with his CPAP at home. Patient unable to tolerate full face mask with CPAP. Patient has PCA pump with ETCO2 connected. Will monitor patients Sp02 and ETCO2 during the night.

## 2015-06-13 NOTE — Op Note (Signed)
LAPAROSCOPIC ASSISTED PARTIAL COLECTOMY; RIGID PROCTOSCOPY  Procedure Note  Alec Kelly 05/16/2015   Pre-op Diagnosis: Recurrent Diverticulitis     Post-op Diagnosis: same  Procedure(s): LAPAROSCOPIC ASSISTED PARTIAL COLECTOMY; RIGID PROCTOSCOPY  Surgeon(s): Stark Klein, MD Coralie Keens, MD  Anesthesia: General  Staff:  Circulator: Beryle Lathe, RN Scrub Person: Beverlee Nims, RN; [place]ade Circulator Assistant: Theora Master Sipsis, RN  Estimated Blood Loss: 300 cc               Specimens: sent to path          [facility] A   Date: 05/19/2015  Time: 11:12 AM

## 2015-06-13 NOTE — Anesthesia Postprocedure Evaluation (Signed)
  Anesthesia Post-op Note  Patient: Alec Kelly  Procedure(s) Performed: Procedure(s): LAPAROSCOPIC ASSISTED PARTIAL COLECTOMY; RIGID PROCTOSCOPY (N/A)  Patient Location: PACU  Anesthesia Type:General  Level of Consciousness: awake  Airway and Oxygen Therapy: Patient Spontanous Breathing and Patient connected to nasal cannula oxygen  Post-op Pain: moderate  Post-op Assessment: Post-op Vital signs reviewed, Patient's Cardiovascular Status Stable, Respiratory Function Stable, Patent Airway, No signs of Nausea or vomiting and Pain level controlled              Post-op Vital Signs: Reviewed and stable  Last Vitals:  Filed Vitals:   06/02/2015 1335  BP:   Pulse:   Temp:   Resp: 17    Complications: No apparent anesthesia complications

## 2015-06-13 NOTE — Interval H&P Note (Signed)
History and Physical Interval Note:no change in H and P  06/07/2015 7:28 AM  Alec Kelly  has presented today for surgery, with the diagnosis of Recurrent Diverticulitis  The various methods of treatment have been discussed with the patient and family. After consideration of risks, benefits and other options for treatment, the patient has consented to  Procedure(s): LAPAROSCOPIC PARTIAL COLECTOMY (N/A) as a surgical intervention .  The patient's history has been reviewed, patient examined, no change in status, stable for surgery.  I have reviewed the patient's chart and labs.  Questions were answered to the patient's satisfaction.     [name] A

## 2015-06-13 NOTE — Anesthesia Procedure Notes (Signed)
Procedure Name: Intubation Date/Time: 05/28/2015 8:51 AM Performed by: Scheryl Darter Pre-anesthesia Checklist: Patient identified, Emergency Drugs available, Suction available, Patient being monitored and Timeout performed Patient Re-evaluated:Patient Re-evaluated prior to inductionOxygen Delivery Method: Circle system utilized Preoxygenation: Pre-oxygenation with 100% oxygen Intubation Type: IV induction and Rapid sequence Ventilation: Mask ventilation without difficulty Laryngoscope Size: Glidescope Grade View: Grade III Tube type: Oral Tube size: 8.0 mm Airway Equipment and Method: Stylet Placement Confirmation: ETT inserted through vocal cords under direct vision,  positive ETCO2 and breath sounds checked- equal and bilateral Secured at: 24 cm Tube secured with: Tape Dental Injury: Teeth and Oropharynx as per pre-operative assessment  Difficulty Due To: Difficulty was anticipated

## 2015-06-14 ENCOUNTER — Encounter (HOSPITAL_COMMUNITY): Payer: Self-pay | Admitting: Surgery

## 2015-06-14 LAB — CBC
HCT: 36.5 % — ABNORMAL LOW (ref 39.0–52.0)
Hemoglobin: 12.5 g/dL — ABNORMAL LOW (ref 13.0–17.0)
MCH: 30.8 pg (ref 26.0–34.0)
MCHC: 34.2 g/dL (ref 30.0–36.0)
MCV: 89.9 fL (ref 78.0–100.0)
Platelets: 233 10*3/uL (ref 150–400)
RBC: 4.06 MIL/uL — ABNORMAL LOW (ref 4.22–5.81)
RDW: 13.2 % (ref 11.5–15.5)
WBC: 10 10*3/uL (ref 4.0–10.5)

## 2015-06-14 LAB — BASIC METABOLIC PANEL
ANION GAP: 8 (ref 5–15)
BUN: 13 mg/dL (ref 6–20)
CALCIUM: 8.1 mg/dL — AB (ref 8.9–10.3)
CHLORIDE: 104 mmol/L (ref 101–111)
CO2: 24 mmol/L (ref 22–32)
Creatinine, Ser: 1.23 mg/dL (ref 0.61–1.24)
GFR calc Af Amer: >60 mL/min (ref >60)
GFR calc non Af Amer: >60 mL/min (ref >60)
Glucose, Bld: 129 mg/dL — ABNORMAL HIGH (ref 65–99)
POTASSIUM: 4.7 mmol/L (ref 3.5–5.1)
SODIUM: 136 mmol/L (ref 135–145)

## 2015-06-14 LAB — GLUCOSE, CAPILLARY: Glucose-Capillary: 117 mg/dL — ABNORMAL HIGH (ref 65–99)

## 2015-06-14 MED ORDER — ENOXAPARIN SODIUM 150 MG/ML ~~LOC~~ SOLN
1.0000 mg/kg | Freq: Two times a day (BID) | SUBCUTANEOUS | Status: DC
  Administered 2015-06-14 – 2015-06-15 (×4): 140 mg via SUBCUTANEOUS
  Filled 2015-06-14 (×8): qty 1

## 2015-06-14 MED ORDER — PROMETHAZINE HCL 25 MG/ML IJ SOLN
25.0000 mg | Freq: Four times a day (QID) | INTRAMUSCULAR | Status: DC | PRN
Start: 2015-06-14 — End: 2015-06-17
  Administered 2015-06-14: 25 mg via INTRAVENOUS
  Filled 2015-06-14: qty 1

## 2015-06-14 NOTE — Progress Notes (Signed)
ANTICOAGULATION CONSULT NOTE - Initial Consult  Pharmacy Consult for Coumadin and Lovenox Indication: Mechanical valve  Allergies  Allergen Reactions  . Ivp Dye [Iodinated Diagnostic Agents] Nausea And Vomiting    ORAL LIQUID   . Morphine And Related Other (See Comments)    Not in right state of mind   . Shellfish-Derived Products Other (See Comments)    exacerbates gout    Patient Measurements: Height: 5\' 8"  (172.7 cm) Weight: (!) 303 lb (137.44 kg) IBW/kg (Calculated) : 68.4  Vital Signs: Temp: 98.4 F (36.9 C) (06/30 0646) Temp Source: Oral (06/29 2135) BP: 125/74 mmHg (06/30 0646) Pulse Rate: 111 (06/30 0646)  Labs:  Recent Labs  06/05/2015 0703 06/14/15 0319  HGB  --  12.5*  HCT  --  36.5*  PLT  --  233  LABPROT 16.1*  --   INR 1.28  --   CREATININE  --  1.23    Estimated Creatinine Clearance: 99.7 mL/min (by C-G formula based on Cr of 1.23).   Medical History: Past Medical History  Diagnosis Date  . Diverticulosis   . Diverticulitis of sigmoid colon 04/07/2013    With perforation  . Obesity, Class III, BMI 40-49.9 (morbid obesity) 04/07/2013  . 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 Aortic arch aneurysm 2015  . Aortic stenosis 2015    s/p Bentall with mechanical ([employer] AVR) 05/2014  . GERD (gastroesophageal reflux disease)   . Claustrophobia     does not like anything put over his face  . NICM (nonischemic cardiomyopathy)     Echo (06/12/14):  Moderate LVH, EF 30-35%, mechanical AVR ok (mean 11 mm Hg), mild LAE, mildly reduced RVSF, mild to moderate RAE, moderate pericardial effusion  . Carotid stenosis     Carotid US (05/31/14):  Bilateral ICA 1-39%  . Pericardial effusion     hemorrhagic after Bentall >>> s/p subxiphoid pericardial window 06/2014   . Kidney stones   . OSA on CPAP     Assessment: 49 yo M presents on 6/28 with recurrent diverticulitis. Here for elective partial colectomy which occurred on 6/29. Has a PMH significant for obesity  and aortic stenosis with mechanical valve. Hgb down to 12.5 from 13.9 today. Plts wnl. No complications from surgery reported and no bleeding noted post op.  PTA Coumadin dose: 10mg  daily exc 7.5mg  on MWF  Goal of Therapy:  INR 2.5-3.5 Monitor platelets by anticoagulation protocol: Yes   Plan:  Start enoxaparin 140mg  SQ Q12 Will plan to restart coumadin tomorrow if pt tolerates PO today Monitor CBC and s/s of bleed F/U diet  Alec Kelly J 06/14/2015,7:18 AM

## 2015-06-14 NOTE — Op Note (Signed)
Alec Kelly, Alec Kelly               ACCOUNT NO.:  [id number]  MEDICAL RECORD NO.:  [id number]  LOCATION:  [id number]                        FACILITY:  [facility]  PHYSICIAN:  Coralie Keens, M.D. DATE OF BIRTH:  August 15, 1966  DATE OF PROCEDURE:  06/14/2015 DATE OF DISCHARGE:                              OPERATIVE REPORT   PREOPERATIVE DIAGNOSIS:  Recurrent diverticulitis.  POSTOPERATIVE DIAGNOSIS:  Recurrent diverticulitis.  PROCEDURE:  Laparoscopic-assisted partial colectomy with rigid proctoscopy.  SURGEON:  Coralie Keens, MD  ASSISTANT:  Stark Klein, MD  ANESTHESIA:  General endotracheal anesthesia.  ESTIMATED BLOOD LOSS:  300 mL.  INDICATIONS:  This is a 49 year old gentleman who over the past 3-4 years has had multiple bouts of sigmoid diverticulitis.  He has even had microperforation and abscess in his pelvis, which was drained by Interventional Radiology.  His last attack was in October 2015.  He is now on therapeutic Coumadin because of heart valve replacement.  He is being bridged with Lovenox preoperatively.  FINDINGS:  The patient was found to have a focal segment of diverticular changes from the previous diverticulitis in the sigmoid colon, which was resected.  PROCEDURE IN DETAIL:  The patient was brought to the operating room, identified as Alec Kelly.  He was placed supine on the operating room table.  General anesthesia was induced.  His abdomen was then prepped and draped in usual sterile fashion.  After he had been placed in lithotomy position, a Foley catheter had been inserted.  I made a small incision in the right upper quadrant.  I used an Optiview camera to traverse all layers of the abdominal wall under direct vision.  I then gained entrance into the abdominal cavity.  Insufflation was then begun with carbon dioxide.  I evaluated the trocar site and saw no evidence of bowel injury.  I then placed two 5 mm ports in patient's right middle lower  quadrant.  The patient had sigmoid colon stuck to abdominal sidewall, which I was able to take down with laparoscopic scissors. Several other adhesions were taken down with laparoscopic scissors as well.  It became evident that this was a focal loop of sigmoid colon involved with the diverticular process.  I could easily identify the distal rectum and proximal colon.  I mobilized the proximal colon slightly with the scissors.  At this point, I then converted to the open portion of the procedure.  I removed all ports and deflated the abdomen. I then created a midline incision in lower abdomen with a scalpel.  I took this down to all fascial layers with the electrocautery.  There was a small piece of mesh at his umbilicus from previous hernia repair and I had to split this mesh slightly.  I was then able to elevate the colon out of the incision.  I mobilized along the white line of Toldt with the cautery device.  I then identified colon that was proximal to the sigmoid colon and transected with the GIA 75 stapler.  I then mobilized the colon in order to control the rectum with the LigaSure cautery device.  I then identified the rectum and transected this with a GIA 75 stapler and took down the  rest of the mesentery with the LigaSure as well.  The specimen was then sent to Pathology for evaluation.  I achieved hemostasis in the mesentery with several more 2-0 silk sutures. At this point, no further mobilization was necessary and it appeared that the descending colon fit easily into the pelvis with his long redundant rectum.  At this point, I opened up the staple line of the descending colon.  I brought the sizes on the field and 33 EEA stapler fit well.  I then placed a 2-0 Prolene pursestring suture around the proximal colon.  I placed an ample into this and then tightened the suture in placed closing the colon around the anvil.  The proximal end of stapling device was then brought up  through the anus and into the rectal stump and brought out to the end of the stump.  We then opened up stapler bringing out the proximal portion of the stapler just anterior to the staple line.  I then connected the anvil to the proximal end of the stapler.  The staple device was then slowly closed bringing the proximal colon down to the rectum.  After waiting at least a minute, the stapling device was fired creating the colorectal anastomosis.  The staple device was then removed and the donuts were checked and found to be intact.  I then placed a bowel clamp on the proximal colon. Proctoscopy was then performed.  The anastomosis was insufflated with saline after the pelvis was filled with fluid and no evidence of leak was identified.  At this point, the staple lines were completely removed.  I again evaluated the anastomosis and it appeared to be pink and well perfused with no tension.  We then copiously irrigated the abdomen with several L of normal saline.  There was a small tear in the small bowel mesentery, which was bleeding and this was controlled with several 2-0 silk sutures.  I then irrigated the abdomen further.  He was therapeutic on Lovenox and appeared to be only mildly was able to be controlled with sutures and the cautery.  At this point, after further evaluation, hemostasis appeared to be achieved.  All the drapes were removed and sterile drapes were reapplied.  At this point, the patient's midline fascia was closed with a running #1 looped PDS suture.  The skin was then irrigated and closed with skin staples.  All other trocar sites were closed with staples as well.  The patient tolerated the procedure well.  All the counts were correct at the end of procedure.  The patient was then extubated in the operating room and taken in stable condition to recovery room.     Coralie Keens, M.D.     DB/MEDQ  D:  05/22/2015  T:  06/14/2015  Job:  [id number]

## 2015-06-14 NOTE — Progress Notes (Signed)
1 Day Post-Op  Subjective: Complains of incisional pain  Objective: Vital signs in last 24 hours: Temp:  [97.6 F (36.4 C)-99.2 F (37.3 C)] 98.4 F (36.9 C) (06/30 0646) Pulse Rate:  [88-113] 111 (06/30 0646) Resp:  [15-26] 17 (06/30 0646) BP: (84-136)/(39-81) 125/74 mmHg (06/30 0646) SpO2:  [91 %-97 %] 96 % (06/30 0646) Last BM Date: 06/12/15  Intake/Output from previous day: 06/29 0701 - 06/30 0700 In: 5489.5 [I.V.:4989.5; IV Piggyback:500] Out: 3295 [Urine:1330; Blood:500] Intake/Output this shift:    Lungs clear Mildly tachy Abdomen soft  Lab Results:   Recent Labs  06/14/15 0319  WBC 10.0  HGB 12.5*  HCT 36.5*  PLT 233   BMET  Recent Labs  06/14/15 0319  NA 136  K 4.7  CL 104  CO2 24  GLUCOSE 129*  BUN 13  CREATININE 1.23  CALCIUM 8.1*   PT/INR  Recent Labs  06/03/2015 0703  LABPROT 16.1*  INR 1.28   ABG No results for input(s): PHART, HCO3 in the last 72 hours.  Invalid input(s): PCO2, PO2  Studies/Results: No results found.  Anti-infectives: Anti-infectives    Start     Dose/Rate Route Frequency Ordered Stop   05/26/2015 0600  ceFAZolin (ANCEF) 3 g in dextrose 5 % 50 mL IVPB     3 g 160 mL/hr over 30 Minutes Intravenous On call to O.R. 06/12/15 1333 05/19/2015 0843   05/31/2015 0600  metroNIDAZOLE (FLAGYL) IVPB 500 mg     500 mg 100 mL/hr over 60 Minutes Intravenous On call to O.R. 06/12/15 1333 06/03/2015 0850      Assessment/Plan: s/p Procedure(s): LAPAROSCOPIC ASSISTED PARTIAL COLECTOMY; RIGID PROCTOSCOPY (N/A)  D/c foley Resume lovenox Clear liquid diet  LOS: 1 day    Alec Kelly A 06/14/2015

## 2015-06-15 LAB — CBC
HEMATOCRIT: 33.5 % — AB (ref 39.0–52.0)
HEMOGLOBIN: 11 g/dL — AB (ref 13.0–17.0)
MCH: 30.4 pg (ref 26.0–34.0)
MCHC: 32.8 g/dL (ref 30.0–36.0)
MCV: 92.5 fL (ref 78.0–100.0)
Platelets: 218 10*3/uL (ref 150–400)
RBC: 3.62 MIL/uL — ABNORMAL LOW (ref 4.22–5.81)
RDW: 13.6 % (ref 11.5–15.5)
WBC: 11.2 10*3/uL — AB (ref 4.0–10.5)

## 2015-06-15 LAB — BASIC METABOLIC PANEL
ANION GAP: 6 (ref 5–15)
BUN: 13 mg/dL (ref 6–20)
CHLORIDE: 103 mmol/L (ref 101–111)
CO2: 29 mmol/L (ref 22–32)
Calcium: 8.4 mg/dL — ABNORMAL LOW (ref 8.9–10.3)
Creatinine, Ser: 1.01 mg/dL (ref 0.61–1.24)
GFR calc Af Amer: >60 mL/min (ref >60)
GFR calc non Af Amer: >60 mL/min (ref >60)
GLUCOSE: 126 mg/dL — AB (ref 65–99)
Potassium: 4.6 mmol/L (ref 3.5–5.1)
Sodium: 138 mmol/L (ref 135–145)

## 2015-06-15 MED ORDER — ACETAMINOPHEN 325 MG PO TABS
650.0000 mg | ORAL_TABLET | Freq: Four times a day (QID) | ORAL | Status: DC | PRN
  Administered 2015-06-15: 650 mg via ORAL
  Filled 2015-06-15: qty 2

## 2015-06-15 MED ORDER — WARFARIN - PHARMACIST DOSING INPATIENT: Freq: Every day | Status: DC

## 2015-06-15 MED ORDER — WARFARIN SODIUM 5 MG PO TABS
10.0000 mg | ORAL_TABLET | Freq: Once | ORAL | Status: AC
  Administered 2015-06-15: 10 mg via ORAL
  Filled 2015-06-15: qty 2

## 2015-06-15 NOTE — Progress Notes (Signed)
GS Progress Note Subjective: Patient uncomfortable this AM.  Wants to vomit.  Said he had the same problem last evening after he took clear liquids.  Objective: Vital signs in last 24 hours: Temp:  [97.5 F (36.4 C)-98.8 F (37.1 C)] 98.8 F (37.1 C) (07/01 0524) Pulse Rate:  [92-109] 92 (07/01 0524) Resp:  [12-25] 14 (07/01 0800) BP: (110-131)/(69-80) 122/72 mmHg (07/01 0524) SpO2:  [92 %-100 %] 96 % (07/01 0800) Last BM Date: 06/14/15  Intake/Output from previous day: 06/30 0701 - 07/01 0700 In: 3025.8 [P.O.:240; I.V.:2785.8] Out: 750 [Urine:750] Intake/Output this shift:    Lungs: Clear to auscultation  Abd: Distended, some rushes of gas, no tinkles.  Moderately tender,  Ecchymosis around trocar sites.  No BM or flatus.  Extremities: No clinical signs or symptoms of DVT  Neuro: Intact  Lab Results: CBC   Recent Labs  06/14/15 0319 06/15/15 0330  WBC 10.0 11.2*  HGB 12.5* 11.0*  HCT 36.5* 33.5*  PLT 233 218   BMET  Recent Labs  06/14/15 0319 06/15/15 0330  NA 136 138  K 4.7 4.6  CL 104 103  CO2 24 29  GLUCOSE 129* 126*  BUN 13 13  CREATININE 1.23 1.01  CALCIUM 8.1* 8.4*   PT/INR  Recent Labs  06/08/2015 0703  LABPROT 16.1*  INR 1.28   ABG No results for input(s): PHART, HCO3 in the last 72 hours.  Invalid input(s): PCO2, PO2  Studies/Results: No results found.  Anti-infectives: Anti-infectives    Start     Dose/Rate Route Frequency Ordered Stop   06/10/2015 0600  ceFAZolin (ANCEF) 3 g in dextrose 5 % 50 mL IVPB     3 g 160 mL/hr over 30 Minutes Intravenous On call to O.R. 06/12/15 1333 05/16/2015 0843   06/06/2015 0600  metroNIDAZOLE (FLAGYL) IVPB 500 mg     500 mg 100 mL/hr over 60 Minutes Intravenous On call to O.R. 06/12/15 1333 05/28/2015 0850      Assessment/Plan: s/p Procedure(s): LAPAROSCOPIC ASSISTED PARTIAL COLECTOMY; RIGID PROCTOSCOPY Will not advance diet, and I told the patient that he should regulate his inpuit since he is  nauseated.  Antinausea medications to be given. WBC elevated slightly, will check again tomorrow.  LOS: 2 days    Kathryne Eriksson. Dahlia Bailiff, MD, FACS [phone] [facility] 06/15/2015

## 2015-06-15 NOTE — Progress Notes (Signed)
Pt refuses CPAP due to not being able to tolerate our nasal mask.

## 2015-06-15 NOTE — Progress Notes (Signed)
ANTICOAGULATION CONSULT NOTE -follow up Pharmacy Consult for Coumadin and Lovenox Indication: Mechanical valve  Allergies  Allergen Reactions  . Ivp Dye [Iodinated Diagnostic Agents] Nausea And Vomiting    ORAL LIQUID   . Morphine And Related Other (See Comments)    Not in right state of mind   . Shellfish-Derived Products Other (See Comments)    exacerbates gout    Patient Measurements: Height: 5\' 8"  (172.7 cm) Weight: (!) 303 lb (137.44 kg) IBW/kg (Calculated) : 68.4  Vital Signs: Temp: 97.6 F (36.4 C) (07/01 0955) Temp Source: Oral (07/01 0955) BP: 136/73 mmHg (07/01 0955) Pulse Rate: 89 (07/01 0955)  Labs:  Recent Labs  05/26/2015 0703 06/14/15 0319 06/15/15 0330  HGB  --  12.5* 11.0*  HCT  --  36.5* 33.5*  PLT  --  233 218  LABPROT 16.1*  --   --   INR 1.28  --   --   CREATININE  --  1.23 1.01    Estimated Creatinine Clearance: 121.5 mL/min (by C-G formula based on Cr of 1.01).   Assessment: 49 yo M presented on 6/28 with recurrent diverticulitis. Here for elective partial colectomy which occurred on 6/29. Has a PMH significant for obesity and aortic stenosis with mechanical valve.  POD #2.  Hgb 13.9>12.6>11. ABLA. Plts wnl. No complications from surgery reported and no bleeding noted post op. Not tolerating PO diet but taking po meds.  D/w CCS PA - OK to start coumadin today. He may not absorb coumadin yet but reasonable to try.  PTA Coumadin dose: 10mg  daily exc 7.5mg  on MWF  Goal of Therapy:  INR 2 - 3 per coumadin clinic  Monitor platelets by anticoagulation protocol: Yes   Plan:  continue enoxaparin 140mg  SQ Q12 Coumadin 10 mg po x 1 dose Daily INR ? Resume home aspirin 81 mg? - maybe once nausea improved Eudelia Bunch, Pharm.D. [phone] 06/15/2015 10:16 AM

## 2015-06-15 DEATH — deceased

## 2015-06-16 LAB — CBC WITH DIFFERENTIAL/PLATELET
BASOS PCT: 1 % (ref 0–1)
Basophils Absolute: 0.2 10*3/uL — ABNORMAL HIGH (ref 0.0–0.1)
Eosinophils Absolute: 0 10*3/uL (ref 0.0–0.7)
Eosinophils Relative: 0 % (ref 0–5)
HCT: 23.6 % — ABNORMAL LOW (ref 39.0–52.0)
HEMOGLOBIN: 8 g/dL — AB (ref 13.0–17.0)
LYMPHS ABS: 1.1 10*3/uL (ref 0.7–4.0)
Lymphocytes Relative: 6 % — ABNORMAL LOW (ref 12–46)
MCH: 31.1 pg (ref 26.0–34.0)
MCHC: 33.9 g/dL (ref 30.0–36.0)
MCV: 91.8 fL (ref 78.0–100.0)
Monocytes Absolute: 1.8 10*3/uL — ABNORMAL HIGH (ref 0.1–1.0)
Monocytes Relative: 10 % (ref 3–12)
NEUTROS PCT: 83 % — AB (ref 43–77)
Neutro Abs: 14.4 10*3/uL — ABNORMAL HIGH (ref 1.7–7.7)
Platelets: 292 10*3/uL (ref 150–400)
RBC: 2.57 MIL/uL — ABNORMAL LOW (ref 4.22–5.81)
RDW: 13.8 % (ref 11.5–15.5)
WBC: 17.5 10*3/uL — ABNORMAL HIGH (ref 4.0–10.5)

## 2015-06-16 LAB — CBC
HCT: 24.3 % — ABNORMAL LOW (ref 39.0–52.0)
Hemoglobin: 8.1 g/dL — ABNORMAL LOW (ref 13.0–17.0)
MCH: 31.6 pg (ref 26.0–34.0)
MCHC: 33.3 g/dL (ref 30.0–36.0)
MCV: 94.9 fL (ref 78.0–100.0)
PLATELETS: 297 10*3/uL (ref 150–400)
RBC: 2.56 MIL/uL — ABNORMAL LOW (ref 4.22–5.81)
RDW: 14 % (ref 11.5–15.5)
WBC: 18.7 10*3/uL — AB (ref 4.0–10.5)

## 2015-06-16 LAB — MRSA PCR SCREENING: MRSA by PCR: NEGATIVE

## 2015-06-16 LAB — PREPARE RBC (CROSSMATCH)

## 2015-06-16 MED ORDER — FENTANYL CITRATE (PF) 100 MCG/2ML IJ SOLN
12.5000 ug | INTRAMUSCULAR | Status: DC | PRN
  Administered 2015-06-16 (×2): 50 ug via INTRAVENOUS
  Filled 2015-06-16 (×2): qty 2

## 2015-06-16 MED ORDER — SODIUM CHLORIDE 0.9 % IV BOLUS (SEPSIS)
1000.0000 mL | Freq: Once | INTRAVENOUS | Status: AC
  Administered 2015-06-16: 1000 mL via INTRAVENOUS

## 2015-06-16 MED ORDER — METHOCARBAMOL 500 MG PO TABS
1000.0000 mg | ORAL_TABLET | Freq: Three times a day (TID) | ORAL | Status: DC | PRN
  Administered 2015-06-16 (×2): 1000 mg via ORAL
  Filled 2015-06-16 (×3): qty 2

## 2015-06-16 MED ORDER — SODIUM CHLORIDE 0.9 % IV SOLN
Freq: Once | INTRAVENOUS | Status: AC
  Administered 2015-06-16: 09:00:00 via INTRAVENOUS

## 2015-06-16 MED ORDER — FENTANYL CITRATE (PF) 100 MCG/2ML IJ SOLN
50.0000 ug | INTRAMUSCULAR | Status: DC | PRN
  Administered 2015-06-16 (×8): 100 ug via INTRAVENOUS
  Filled 2015-06-16 (×9): qty 2

## 2015-06-16 MED ORDER — SODIUM CHLORIDE 0.9 % IV BOLUS (SEPSIS)
500.0000 mL | Freq: Once | INTRAVENOUS | Status: AC
  Administered 2015-06-16: 500 mL via INTRAVENOUS

## 2015-06-16 NOTE — Progress Notes (Signed)
Patient transferred from 6N via bed on tele. Patient AAOx4, BP low. Denies pain. Awaiting blood to be ready from blood bank in order to transfuse. Oriented to unit and room, instructed on callbell and placed at side. Bed alarm on. Belongings at bedside.

## 2015-06-16 NOTE — Progress Notes (Signed)
UR COMPLETED  

## 2015-06-16 NOTE — Progress Notes (Addendum)
Patient reports severe pain LLQ abdomen despite Fentanyl and Robaxin. Patient also c/o difficulty breathing with associated pain, encouraged incentive spirometer, although patient declined. Dr. Georgette Dover notified, new orders received. Told to encouraged IS. Will continue to monitor.

## 2015-06-16 NOTE — Progress Notes (Signed)
Dr. Georgette Dover notified of patient's low urine output. Patient receiving fluids and blood products. Told to continue to monitor.

## 2015-06-16 NOTE — Progress Notes (Addendum)
[facility] Progress Note  3 Days Post-Op  Subjective: Pt was having trouble breathing this am, was put on 4L [place], he's since been put in the chair and he's breathing better 93-94 sats on 2L , he's not c/o much SOB, but has mild increase WOB.  Not much abdominal pain, no N/V, no flatus or BM yet.  Urinated some, but very little.    Objective: Vital signs in last 24 hours: Temp:  [97.3 F (36.3 C)-97.9 F (36.6 C)] 97.6 F (36.4 C) (07/02 0152) Pulse Rate:  [70-112] 106 (07/02 0500) Resp:  [14-26] 22 (07/02 0500) BP: (82-136)/(46-79) 84/48 mmHg (07/02 0500) SpO2:  [89 %-99 %] 92 % (07/02 0500) Last BM Date: 06/14/15  Intake/Output from previous day: 07/01 0701 - 07/02 0700 In: 2052 [P.O.:80; I.V.:972; IV Piggyback:1000] Out: 450 [Urine:450] Intake/Output this shift:    PE: Gen:  Alert, NAD, pleasant Card:  Tachy, normal rhythm, no M/G/R heard Pulm:  CTA, no W/R/R, tachy, good effort IS up to 1000, on 2L O2 93-94% Abd: Soft, NT/ND, +BS, no HSM, incisions C/D/I, drain with minimal sanguinous drainage, no abdominal scars noted Ext:  No erythema, edema, or tenderness, he notes burning pain in his right lateral leg, but no findings   Lab Results:   Recent Labs  06/15/15 0330 06/16/15 0515  WBC 11.2* 17.5*  HGB 11.0* 8.0*  HCT 33.5* 23.6*  PLT 218 292   BMET  Recent Labs  06/14/15 0319 06/15/15 0330  NA 136 138  K 4.7 4.6  CL 104 103  CO2 24 29  GLUCOSE 129* 126*  BUN 13 13  CREATININE 1.23 1.01  CALCIUM 8.1* 8.4*   PT/INR No results for input(s): LABPROT, INR in the last 72 hours. CMP     Component Value Date/Time   NA 138 06/15/2015 0330   K 4.6 06/15/2015 0330   CL 103 06/15/2015 0330   CO2 29 06/15/2015 0330   GLUCOSE 126* 06/15/2015 0330   BUN 13 06/15/2015 0330   CREATININE 1.01 06/15/2015 0330   CALCIUM 8.4* 06/15/2015 0330   PROT 7.2 06/05/2015 1437   ALBUMIN 4.0 06/05/2015 1437   AST 23 06/05/2015 1437   ALT 16* 06/05/2015  1437   ALKPHOS 58 06/05/2015 1437   BILITOT 0.7 06/05/2015 1437   GFRNONAA >60 06/15/2015 0330   GFRAA >60 06/15/2015 0330   Lipase     Component Value Date/Time   LIPASE 146* 04/02/2014 1700       Studies/Results: No results found.  Anti-infectives: Anti-infectives    Start     Dose/Rate Route Frequency Ordered Stop   05/25/2015 0600  ceFAZolin (ANCEF) 3 g in dextrose 5 % 50 mL IVPB     3 g 160 mL/hr over 30 Minutes Intravenous On call to O.R. 06/12/15 1333 05/20/2015 0843   06/06/2015 0600  metroNIDAZOLE (FLAGYL) IVPB 500 mg     500 mg 100 mL/hr over 60 Minutes Intravenous On call to O.R. 06/12/15 1333 05/16/2015 0850       Assessment/Plan Recurrent diverticulitis POD #3 s/p LAPAROSCOPIC ASSISTED PARTIAL COLECTOMY; RIGID PROCTOSCOPY -Pt has been tachycardic, tachypnic, hypoxic, improved requiring 2L O2 with sats in 93-94% -Will not advance diet, until better bowel function -Antiemetics, IVF, blood -WBC elevated , will check again tomorrow -D/c PCA as he is very sleepy and given respiratory status switch to fentanyl IV push -Place foley to monitor I&O closely -Ice, robaxin, tylenol  Leukocytosis - 17.5 ABL Anemia -Pt is symptomatic, 2 unit  pRBC supplementation -Hold coumadin and heparin  Disp:  Remain on floor, but if O2 sats drop again, would transfer to SDU     LOS: 3 days    Nat Christen 06/16/2015, 8:00 AM Pager: [phone]   Getting blood.   Await return of bowel function.   Send to stepdown.

## 2015-06-17 ENCOUNTER — Inpatient Hospital Stay (HOSPITAL_COMMUNITY): Payer: 59 | Admitting: Anesthesiology

## 2015-06-17 DIAGNOSIS — I469 Cardiac arrest, cause unspecified: Secondary | ICD-10-CM

## 2015-06-17 LAB — TYPE AND SCREEN
ABO/RH(D): O POS
Antibody Screen: NEGATIVE
UNIT DIVISION: 0
Unit division: 0

## 2015-06-17 LAB — GLUCOSE, CAPILLARY: GLUCOSE-CAPILLARY: 162 mg/dL — AB (ref 65–99)

## 2015-06-17 MED ORDER — SODIUM BICARBONATE 8.4 % IV SOLN
INTRAVENOUS | Status: AC
  Filled 2015-06-17: qty 50

## 2015-06-17 MED FILL — Medication: Qty: 1 | Status: AC

## 2015-06-20 NOTE — Discharge Summary (Signed)
Physician Discharge Summary  Patient ID: Alec Kelly MRN: [id number] DOB/AGE: 07/01/1966 49 y.o.  Admit date: 05/21/2015 Discharge date: 07-20-15 DEATH  Admission Diagnoses:  Discharge Diagnoses:  Active Problems:   S/P partial colectomy postop blood loss anemia PEA arrest  Discharged Condition: Death  Hospital Course: This gentleman was admitted for an elective laparoscopic partial colectomy for recurrent diverticulitis. He was on chronic anticoagulation with Coumadin. He was being bridged with therapeutic Lovenox preoperatively. On 6/29 he was taken to the operating room and underwent an uneventful laparoscopic-assisted partial colectomy. On postoperative day one he had no nausea so was started on clear liquids per the Entereg protocol.  At that time, his postoperative hemoglobin was stable at 12.5. He continued on IV rehydration. On postoperative day 2 his hemoglobin was 11.0 as expected with the rehydration. Pharmacy was asked to consult for resumption of the patient's necessary anticoagulation medications. At this point, I have transferred the patient already to my partner's care as I was out of town. All further events that occurred I was not present for and have included in the discharge summary through chart review.  Apparently on postoperative day 3 he was found to be short of breath. His hemoglobin at that time had decreased to 8.0. Transfusions were ordered and he was transferred to the intensive care area. Apparently, he had remained hemodynamically stable. He was undergoing Kelly slow transfusion secondary to his history of cardiac problems. Late in the evening, after getting out of bed, he became bradycardic and then developed PEA. Rapid response and Kelly CODE BLUE were instituted. He was emergently intubated. Repeat hemoglobin was still 8.1. Despite very aggressive CPR, he could not be revived and remained pulseless and the code was called.  Consults: pulmonary/intensive  care  Significant Diagnostic Studies:   Treatments: surgery: lap assisted partial colectoy  Discharge Exam: Blood pressure 93/68, pulse 133, temperature 97.7 F (36.5 C), temperature source Oral, resp. rate 43, height 5\' 8"  (1.727 m), weight 147.7 kg (325 lb 9.9 oz), SpO2 100 %.  Patient deceased  Disposition: 20-Expired     Medication List    ASK your doctor about these medications        aspirin 81 MG EC tablet  Take 1 tablet (81 mg total) by mouth daily.     colchicine 0.6 MG tablet  Take 1 tablet (0.6 mg total) by mouth 2 (two) times daily.     enoxaparin 150 MG/ML injection  Commonly known as:  LOVENOX  Inject 1 mL (150 mg total) into the skin every 12 (twelve) hours.     furosemide 40 MG tablet  Commonly known as:  LASIX  Take 1 tablet (40 mg total) by mouth daily.     lisinopril 5 MG tablet  Commonly known as:  PRINIVIL,ZESTRIL  Take 1 tablet (5 mg total) by mouth daily.     metoprolol succinate 50 MG 24 hr tablet  Commonly known as:  TOPROL-XL  Take 50 mg AM and 25 mg PM     sildenafil 100 MG tablet  Commonly known as:  VIAGRA  Take 1/2 tab 30 mins prior as needed     warfarin 5 MG tablet  Commonly known as:  COUMADIN  Take 2 tabs (10mg ) daily by mouth except 1 1/2 tabs (7.5mg ) on Monday, Wednesday, & Friday - instructions per coumadin clinic.         SignedCoralie Keens Kelly 20-Jul-2015, 12:20 PM

## 2015-07-16 NOTE — Progress Notes (Signed)
Patient noted at midnight to have low blood pressures, non radiating substernal chest pain, and sweaty clammy skin.  EKG performed on patient and results in chart.  Pt. Blood pressure returned to stable with complaints of being hot after EKG finished.  Walked out of pt. Room to contact MD, but was called by [facility] about pt. Brady HR.  Walked into pt. Room to find him unresponsive, code called:  See code sheet.    Code called by MD at bedside.  Family at bedside with others en route.  Dr. Georgette Dover notified of situation leading up to and pt. Death.  [facility] notified referral # [id number] spoke with Bethena Roys.  Post mortem care completed after family has seen the patient.  Eye care done.

## 2015-07-16 NOTE — Progress Notes (Addendum)
   2015-07-10 0300  Clinical Encounter Type  Visited With Family  Visit Type Initial;Code;Death  Spiritual Encounters  Spiritual Needs Grief support  Stress Factors  Family Stress Factors Loss   Chaplain was paged to patient's room at 1:10 AM due to a code blue in progress. When chaplain arrived, patient had passed away. Patient's long time girlfriend was present. Chaplain provided patient's girlfriend with grief support. Patient's girlfriend shared several memories of the patient and took the loss really hard. Patient's girlfriend explained that her father was coming to support her and that the patient's sisters were en route to the hospital. Chaplain provided grief support and listened to the family's stories of the patients until all of the family members arrived. Patient's sisters supported each other and visited patient at bedside. Patient's sisters also decided on a funeral home and chaplain communicated this information to patient's nurse. No further family is expected at this time. Family has grieved and will likely be heading home soon.  Jady Braggs, Claudius Sis, Chaplain  3:32 AM

## 2015-07-16 NOTE — Progress Notes (Signed)
Medical Examiner Cora Collum notified of patient death and has said that the patient is not a medical examiner case.

## 2015-07-16 NOTE — Progress Notes (Signed)
eLink Physician-Brief Progress Note Patient Name: Alec Kelly DOB: [birth year] MRN: [id number]  Date of Service  June 27, 2015   HPI/Events of Note   Got update from April of rapid response and Dr Sherian Maroon of night float CCM that patient expired despite aggressive CPR interventions  eICU Interventions  x   Intervention Category Major Interventions: Code management / supervision  Alec Kelly 06-27-2015, 1:43 AM

## 2015-07-16 NOTE — Anesthesia Procedure Notes (Signed)
Procedure Name: Intubation Date/Time: 2015/06/22 12:48 AM Performed by: Maude Leriche D Pre-anesthesia Checklist: Patient identified, Emergency Drugs available, Suction available, Patient being monitored and Timeout performed Patient Re-evaluated:Patient Re-evaluated prior to inductionOxygen Delivery Method: Circle system utilized Preoxygenation: Pre-oxygenation with 100% oxygen Laryngoscope Size: Miller and 2 Grade View: Grade I Tube type: Subglottic suction tube Tube size: 7.5 mm Number of attempts: 1 Airway Equipment and Method: Stylet Placement Confirmation: ETT inserted through vocal cords under direct vision,  CO2 detector and breath sounds checked- equal and bilateral Secured at: 23 cm Tube secured with: Tape Dental Injury: Teeth and Oropharynx as per pre-operative assessment

## 2015-07-16 NOTE — Code Documentation (Addendum)
Called to bedside for code in progress. On my arrival, ACLS was underway. ACLS protocol continued on my arrival. Additional amiodarone, bicarbonate, calcium, and magnesium were given as well to try to attain a stable pulse. Unfortunately, I had to leave in the middle of the code for a second code on 46M and the code was taken over by the rapid response nurse. While I was gone there was ROSC but it was lost after a few minutes. On my return his abdomen was markedly swollen and compressions had been resumed. We briefly regained a pulse but it was quickly lost. We continued aggressive medication efforts, however, his rhythem continued to decay. The code was called after approximately 40 minutes.  CRITICAL CARE: The patient is critically ill with multiple organ systems failure and requires high complexity decision making for assessment and support, frequent evaluation and titration of therapies, application of advanced monitoring technologies and extensive interpretation of multiple databases. Critical Care Time devoted to patient care services described in this note is 65 minutes.

## 2015-07-16 NOTE — Transfer of Care (Signed)
Immediate Anesthesia Transfer of Care Note  Patient: Alec Kelly  Procedure(s) Performed: * No procedures listed *  Patient Location: ICU  Anesthesia Type:General  Level of Consciousness: unresponsive and Patient remains intubated per anesthesia plan  Airway & Oxygen Therapy: Patient remains intubated per anesthesia plan  Post-op Assessment: Report given to RN  Post vital signs: unstable  Last Vitals:  Filed Vitals:   06/16/15 2029  BP: 77/45  Pulse: 111  Temp: 36.5 C  Resp: 18    Complications: Code

## 2015-07-16 NOTE — Progress Notes (Signed)
eLink Physician-Brief Progress Note Patient Name: Alec Kelly DOB: 09/08/1966 MRN: [id number]   Date of Service  07/15/15  HPI/Events of Note  PEA arerest - asked to camera   In - did so stat  eICU Interventions  CPR in progress per Rapid response - < 2 min duration  Night float CCM to bedside STat and he took over     Intervention Category Major Interventions: Code management / supervision  Tarek Cravens 07/15/2015, 12:40 AM

## 2015-07-16 DEATH — deceased
